# Patient Record
Sex: Female | Born: 1935
Health system: Southern US, Community
[De-identification: ages and names within clinical notes are randomized; demographics above are authoritative.]

## PROBLEM LIST (undated history)

## (undated) DIAGNOSIS — B029 Zoster without complications: Secondary | ICD-10-CM

## (undated) DIAGNOSIS — N3289 Other specified disorders of bladder: Secondary | ICD-10-CM

## (undated) DIAGNOSIS — H9191 Unspecified hearing loss, right ear: Secondary | ICD-10-CM

## (undated) DIAGNOSIS — D329 Benign neoplasm of meninges, unspecified: Secondary | ICD-10-CM

## (undated) DIAGNOSIS — C541 Malignant neoplasm of endometrium: Secondary | ICD-10-CM

## (undated) DIAGNOSIS — N2881 Hypertrophy of kidney: Secondary | ICD-10-CM

## (undated) DIAGNOSIS — I10 Essential (primary) hypertension: Secondary | ICD-10-CM

## (undated) DIAGNOSIS — K5790 Diverticulosis of intestine, part unspecified, without perforation or abscess without bleeding: Secondary | ICD-10-CM

## (undated) DIAGNOSIS — H3530 Unspecified macular degeneration: Secondary | ICD-10-CM

## (undated) DIAGNOSIS — C4490 Unspecified malignant neoplasm of skin, unspecified: Secondary | ICD-10-CM

## (undated) DIAGNOSIS — E039 Hypothyroidism, unspecified: Secondary | ICD-10-CM

## (undated) DIAGNOSIS — F419 Anxiety disorder, unspecified: Secondary | ICD-10-CM

## (undated) DIAGNOSIS — E785 Hyperlipidemia, unspecified: Secondary | ICD-10-CM

## (undated) DIAGNOSIS — K7689 Other specified diseases of liver: Secondary | ICD-10-CM

## (undated) HISTORY — DX: Hypothyroidism, unspecified: E03.9

## (undated) HISTORY — DX: Hypertrophy of kidney: N28.81

## (undated) HISTORY — DX: Benign neoplasm of meninges, unspecified: D32.9

## (undated) HISTORY — DX: Essential (primary) hypertension: I10

## (undated) HISTORY — DX: Diverticulosis of intestine, part unspecified, without perforation or abscess without bleeding: K57.90

## (undated) HISTORY — DX: Anxiety disorder, unspecified: F41.9

## (undated) HISTORY — PX: OTHER SURGICAL HISTORY: SHX169

## (undated) HISTORY — DX: Other specified disorders of bladder: N32.89

## (undated) HISTORY — PX: CATARACT EXTRACTION: SUR2

## (undated) HISTORY — DX: Other specified diseases of liver: K76.89

## (undated) HISTORY — DX: Zoster without complications: B02.9

## (undated) HISTORY — DX: Unspecified malignant neoplasm of skin, unspecified: C44.90

## (undated) HISTORY — DX: Hyperlipidemia, unspecified: E78.5

## (undated) HISTORY — PX: TONSILLECTOMY: SUR1361

---

## 1998-07-20 ENCOUNTER — Emergency Department (HOSPITAL_COMMUNITY): Admission: EM | Admit: 1998-07-20 | Discharge: 1998-07-20 | Payer: Self-pay | Admitting: Emergency Medicine

## 1998-07-20 ENCOUNTER — Encounter: Payer: Self-pay | Admitting: Emergency Medicine

## 1998-08-30 ENCOUNTER — Ambulatory Visit (HOSPITAL_COMMUNITY): Admission: RE | Admit: 1998-08-30 | Discharge: 1998-08-30 | Payer: Self-pay | Admitting: Internal Medicine

## 1998-08-30 ENCOUNTER — Encounter: Payer: Self-pay | Admitting: Internal Medicine

## 1999-09-02 ENCOUNTER — Ambulatory Visit (HOSPITAL_COMMUNITY): Admission: RE | Admit: 1999-09-02 | Discharge: 1999-09-02 | Payer: Self-pay | Admitting: Internal Medicine

## 1999-09-02 ENCOUNTER — Encounter: Payer: Self-pay | Admitting: Internal Medicine

## 1999-09-12 ENCOUNTER — Encounter: Payer: Self-pay | Admitting: Otolaryngology

## 1999-09-12 ENCOUNTER — Encounter: Admission: RE | Admit: 1999-09-12 | Discharge: 1999-09-12 | Payer: Self-pay | Admitting: Otolaryngology

## 1999-11-17 ENCOUNTER — Encounter: Payer: Self-pay | Admitting: Internal Medicine

## 1999-11-17 ENCOUNTER — Ambulatory Visit (HOSPITAL_COMMUNITY): Admission: RE | Admit: 1999-11-17 | Discharge: 1999-11-17 | Payer: Self-pay | Admitting: Internal Medicine

## 2000-01-28 ENCOUNTER — Encounter: Payer: Self-pay | Admitting: Internal Medicine

## 2000-01-28 ENCOUNTER — Ambulatory Visit (HOSPITAL_COMMUNITY): Admission: RE | Admit: 2000-01-28 | Discharge: 2000-01-28 | Payer: Self-pay | Admitting: Internal Medicine

## 2000-06-14 ENCOUNTER — Encounter: Payer: Self-pay | Admitting: Internal Medicine

## 2000-06-14 ENCOUNTER — Encounter: Admission: RE | Admit: 2000-06-14 | Discharge: 2000-06-14 | Payer: Self-pay | Admitting: Internal Medicine

## 2000-09-07 ENCOUNTER — Encounter: Payer: Self-pay | Admitting: Internal Medicine

## 2000-09-07 ENCOUNTER — Ambulatory Visit (HOSPITAL_COMMUNITY): Admission: RE | Admit: 2000-09-07 | Discharge: 2000-09-07 | Payer: Self-pay | Admitting: Internal Medicine

## 2004-12-22 ENCOUNTER — Ambulatory Visit: Payer: Self-pay | Admitting: Internal Medicine

## 2005-05-25 ENCOUNTER — Ambulatory Visit: Payer: Self-pay | Admitting: Internal Medicine

## 2005-06-15 ENCOUNTER — Ambulatory Visit: Payer: Self-pay | Admitting: Internal Medicine

## 2005-06-18 ENCOUNTER — Encounter: Admission: RE | Admit: 2005-06-18 | Discharge: 2005-06-18 | Payer: Self-pay | Admitting: Internal Medicine

## 2005-07-06 ENCOUNTER — Ambulatory Visit (HOSPITAL_COMMUNITY): Admission: RE | Admit: 2005-07-06 | Discharge: 2005-07-06 | Payer: Self-pay | Admitting: Internal Medicine

## 2005-12-01 ENCOUNTER — Ambulatory Visit: Payer: Self-pay | Admitting: Internal Medicine

## 2006-04-22 ENCOUNTER — Ambulatory Visit: Payer: Self-pay | Admitting: Internal Medicine

## 2006-06-09 ENCOUNTER — Ambulatory Visit: Payer: Self-pay | Admitting: Endocrinology

## 2006-06-25 ENCOUNTER — Ambulatory Visit: Payer: Self-pay | Admitting: Internal Medicine

## 2006-07-12 ENCOUNTER — Ambulatory Visit (HOSPITAL_COMMUNITY): Admission: RE | Admit: 2006-07-12 | Discharge: 2006-07-12 | Payer: Self-pay | Admitting: Internal Medicine

## 2006-07-19 ENCOUNTER — Ambulatory Visit: Payer: Self-pay | Admitting: Internal Medicine

## 2006-07-27 ENCOUNTER — Encounter: Admission: RE | Admit: 2006-07-27 | Discharge: 2006-07-27 | Payer: Self-pay | Admitting: Internal Medicine

## 2007-03-29 ENCOUNTER — Ambulatory Visit: Payer: Self-pay | Admitting: Internal Medicine

## 2007-04-27 ENCOUNTER — Ambulatory Visit: Payer: Self-pay | Admitting: Internal Medicine

## 2007-04-27 LAB — CONVERTED CEMR LAB
BUN: 29 mg/dL — ABNORMAL HIGH (ref 6–23)
CO2: 29 meq/L (ref 19–32)
Calcium: 10 mg/dL (ref 8.4–10.5)
Chloride: 108 meq/L (ref 96–112)
Cholesterol: 255 mg/dL (ref 0–200)
Creatinine, Ser: 1.4 mg/dL — ABNORMAL HIGH (ref 0.4–1.2)
Direct LDL: 155.2 mg/dL
Free T4: 0.9 ng/dL (ref 0.6–1.6)
GFR calc Af Amer: 46 mL/min
GFR calc non Af Amer: 38 mL/min
Glucose, Bld: 104 mg/dL — ABNORMAL HIGH (ref 70–99)
HDL: 34 mg/dL — ABNORMAL LOW (ref >39.0)
Potassium: 4 meq/L (ref 3.5–5.1)
Sodium: 143 meq/L (ref 135–145)
TSH: 2.68 microintl units/mL (ref 0.35–5.50)
Total CHOL/HDL Ratio: 7.5
Triglycerides: 528 mg/dL (ref 0–149)
VLDL: 106 mg/dL — ABNORMAL HIGH (ref 0–40)

## 2007-04-30 ENCOUNTER — Encounter: Admission: RE | Admit: 2007-04-30 | Discharge: 2007-04-30 | Payer: Self-pay | Admitting: Internal Medicine

## 2007-05-13 ENCOUNTER — Encounter: Payer: Self-pay | Admitting: *Deleted

## 2007-05-13 DIAGNOSIS — E039 Hypothyroidism, unspecified: Secondary | ICD-10-CM | POA: Insufficient documentation

## 2007-05-13 DIAGNOSIS — E785 Hyperlipidemia, unspecified: Secondary | ICD-10-CM

## 2007-05-13 DIAGNOSIS — N3289 Other specified disorders of bladder: Secondary | ICD-10-CM

## 2007-05-13 DIAGNOSIS — Z85828 Personal history of other malignant neoplasm of skin: Secondary | ICD-10-CM

## 2007-05-13 DIAGNOSIS — Z9849 Cataract extraction status, unspecified eye: Secondary | ICD-10-CM

## 2007-05-13 DIAGNOSIS — K7689 Other specified diseases of liver: Secondary | ICD-10-CM

## 2007-05-13 DIAGNOSIS — D320 Benign neoplasm of cerebral meninges: Secondary | ICD-10-CM

## 2007-05-13 DIAGNOSIS — I10 Essential (primary) hypertension: Secondary | ICD-10-CM | POA: Insufficient documentation

## 2007-05-13 HISTORY — DX: Other specified diseases of liver: K76.89

## 2007-06-23 ENCOUNTER — Telehealth: Payer: Self-pay | Admitting: Internal Medicine

## 2007-07-04 ENCOUNTER — Ambulatory Visit: Payer: Self-pay | Admitting: Internal Medicine

## 2007-07-25 ENCOUNTER — Ambulatory Visit: Payer: Self-pay | Admitting: Internal Medicine

## 2007-07-25 DIAGNOSIS — F4321 Adjustment disorder with depressed mood: Secondary | ICD-10-CM | POA: Insufficient documentation

## 2007-07-27 ENCOUNTER — Ambulatory Visit: Payer: Self-pay | Admitting: Licensed Clinical Social Worker

## 2007-08-29 ENCOUNTER — Ambulatory Visit: Payer: Self-pay | Admitting: Internal Medicine

## 2007-08-29 LAB — CONVERTED CEMR LAB
BUN: 23 mg/dL (ref 6–23)
Basophils Absolute: 0 10*3/uL (ref 0.0–0.1)
Basophils Relative: 0 % (ref 0.0–1.0)
CO2: 29 meq/L (ref 19–32)
Calcium: 9.8 mg/dL (ref 8.4–10.5)
Chloride: 104 meq/L (ref 96–112)
Creatinine, Ser: 1.4 mg/dL — ABNORMAL HIGH (ref 0.4–1.2)
Eosinophils Absolute: 0.3 10*3/uL (ref 0.0–0.6)
Eosinophils Relative: 4.2 % (ref 0.0–5.0)
GFR calc Af Amer: 46 mL/min
GFR calc non Af Amer: 38 mL/min
Glucose, Bld: 126 mg/dL — ABNORMAL HIGH (ref 70–99)
HCT: 39.5 % (ref 36.0–46.0)
Hemoglobin: 14 g/dL (ref 12.0–15.0)
Lymphocytes Relative: 23.8 % (ref 12.0–46.0)
MCHC: 35.3 g/dL (ref 30.0–36.0)
MCV: 89.6 fL (ref 78.0–100.0)
Monocytes Absolute: 0.4 10*3/uL (ref 0.2–0.7)
Monocytes Relative: 5.5 % (ref 3.0–11.0)
Neutro Abs: 5.2 10*3/uL (ref 1.4–7.7)
Neutrophils Relative %: 66.5 % (ref 43.0–77.0)
Platelets: 271 10*3/uL (ref 150–400)
Potassium: 4.1 meq/L (ref 3.5–5.1)
RBC: 4.41 M/uL (ref 3.87–5.11)
RDW: 11.8 % (ref 11.5–14.6)
Sodium: 140 meq/L (ref 135–145)
WBC: 7.7 10*3/uL (ref 4.5–10.5)

## 2007-08-30 ENCOUNTER — Encounter: Payer: Self-pay | Admitting: Internal Medicine

## 2007-08-31 ENCOUNTER — Ambulatory Visit (HOSPITAL_COMMUNITY): Admission: RE | Admit: 2007-08-31 | Discharge: 2007-08-31 | Payer: Self-pay | Admitting: Internal Medicine

## 2007-09-06 ENCOUNTER — Ambulatory Visit: Payer: Self-pay

## 2007-09-16 ENCOUNTER — Telehealth: Payer: Self-pay | Admitting: Internal Medicine

## 2007-09-30 ENCOUNTER — Encounter: Payer: Self-pay | Admitting: Internal Medicine

## 2007-10-19 ENCOUNTER — Ambulatory Visit: Payer: Self-pay | Admitting: Internal Medicine

## 2007-10-19 ENCOUNTER — Telehealth: Payer: Self-pay | Admitting: Internal Medicine

## 2007-10-19 DIAGNOSIS — R4182 Altered mental status, unspecified: Secondary | ICD-10-CM

## 2007-10-19 LAB — CONVERTED CEMR LAB
BUN: 22 mg/dL (ref 6–23)
Basophils Absolute: 0.1 10*3/uL (ref 0.0–0.1)
Basophils Relative: 1 % (ref 0.0–1.0)
Bilirubin Urine: NEGATIVE
CO2: 29 meq/L (ref 19–32)
Calcium: 9.8 mg/dL (ref 8.4–10.5)
Chloride: 103 meq/L (ref 96–112)
Creatinine, Ser: 1.3 mg/dL — ABNORMAL HIGH (ref 0.4–1.2)
Eosinophils Absolute: 0.2 10*3/uL (ref 0.0–0.6)
Eosinophils Relative: 2.2 % (ref 0.0–5.0)
Folate: 8.9 ng/mL
GFR calc Af Amer: 50 mL/min
GFR calc non Af Amer: 42 mL/min
Glucose, Bld: 112 mg/dL — ABNORMAL HIGH (ref 70–99)
Glucose, Urine, Semiquant: NEGATIVE
HCT: 43.2 % (ref 36.0–46.0)
Hemoglobin: 14.5 g/dL (ref 12.0–15.0)
Ketones, urine, test strip: NEGATIVE
Lymphocytes Relative: 27.7 % (ref 12.0–46.0)
MCHC: 33.6 g/dL (ref 30.0–36.0)
MCV: 89.2 fL (ref 78.0–100.0)
Monocytes Absolute: 0.6 10*3/uL (ref 0.2–0.7)
Monocytes Relative: 7.8 % (ref 3.0–11.0)
Neutro Abs: 4.8 10*3/uL (ref 1.4–7.7)
Neutrophils Relative %: 61.3 % (ref 43.0–77.0)
Nitrite: NEGATIVE
Platelets: 283 10*3/uL (ref 150–400)
Potassium: 3.9 meq/L (ref 3.5–5.1)
RBC: 4.84 M/uL (ref 3.87–5.11)
RDW: 11.7 % (ref 11.5–14.6)
Sodium: 140 meq/L (ref 135–145)
Specific Gravity, Urine: 1.025
TSH: 2.84 microintl units/mL (ref 0.35–5.50)
Urobilinogen, UA: 0.2
Vitamin B-12: 195 pg/mL — ABNORMAL LOW (ref 211–911)
WBC Urine, dipstick: NEGATIVE
WBC: 7.9 10*3/uL (ref 4.5–10.5)
pH: 6

## 2007-10-20 ENCOUNTER — Encounter: Payer: Self-pay | Admitting: Internal Medicine

## 2007-10-29 ENCOUNTER — Encounter: Admission: RE | Admit: 2007-10-29 | Discharge: 2007-10-29 | Payer: Self-pay | Admitting: Internal Medicine

## 2007-11-06 ENCOUNTER — Encounter: Payer: Self-pay | Admitting: Internal Medicine

## 2007-11-10 ENCOUNTER — Ambulatory Visit: Payer: Self-pay | Admitting: Internal Medicine

## 2007-11-10 DIAGNOSIS — E538 Deficiency of other specified B group vitamins: Secondary | ICD-10-CM | POA: Insufficient documentation

## 2007-12-07 ENCOUNTER — Ambulatory Visit: Payer: Self-pay | Admitting: Internal Medicine

## 2008-01-06 ENCOUNTER — Ambulatory Visit: Payer: Self-pay | Admitting: Internal Medicine

## 2008-01-06 ENCOUNTER — Telehealth: Payer: Self-pay | Admitting: Internal Medicine

## 2008-01-13 ENCOUNTER — Ambulatory Visit: Payer: Self-pay | Admitting: Internal Medicine

## 2008-01-13 DIAGNOSIS — R197 Diarrhea, unspecified: Secondary | ICD-10-CM

## 2008-02-07 ENCOUNTER — Ambulatory Visit: Payer: Self-pay | Admitting: Internal Medicine

## 2008-02-27 ENCOUNTER — Ambulatory Visit: Payer: Self-pay | Admitting: Internal Medicine

## 2008-03-08 ENCOUNTER — Ambulatory Visit: Payer: Self-pay | Admitting: Internal Medicine

## 2008-03-21 ENCOUNTER — Ambulatory Visit: Payer: Self-pay | Admitting: Internal Medicine

## 2008-03-21 ENCOUNTER — Encounter (INDEPENDENT_AMBULATORY_CARE_PROVIDER_SITE_OTHER): Payer: Self-pay | Admitting: *Deleted

## 2008-03-23 ENCOUNTER — Encounter: Payer: Self-pay | Admitting: Internal Medicine

## 2008-03-26 ENCOUNTER — Encounter: Payer: Self-pay | Admitting: Internal Medicine

## 2008-03-28 ENCOUNTER — Telehealth: Payer: Self-pay | Admitting: Internal Medicine

## 2008-04-05 ENCOUNTER — Ambulatory Visit: Payer: Self-pay | Admitting: Internal Medicine

## 2008-05-09 ENCOUNTER — Ambulatory Visit: Payer: Self-pay | Admitting: Internal Medicine

## 2008-07-30 ENCOUNTER — Telehealth: Payer: Self-pay | Admitting: Internal Medicine

## 2008-09-20 ENCOUNTER — Ambulatory Visit (HOSPITAL_COMMUNITY): Admission: RE | Admit: 2008-09-20 | Discharge: 2008-09-20 | Payer: Self-pay | Admitting: Internal Medicine

## 2008-10-03 ENCOUNTER — Telehealth: Payer: Self-pay | Admitting: Internal Medicine

## 2008-10-04 ENCOUNTER — Telehealth: Payer: Self-pay | Admitting: Internal Medicine

## 2008-10-22 ENCOUNTER — Ambulatory Visit: Payer: Self-pay | Admitting: Internal Medicine

## 2008-10-22 DIAGNOSIS — M25519 Pain in unspecified shoulder: Secondary | ICD-10-CM

## 2009-01-23 ENCOUNTER — Telehealth: Payer: Self-pay | Admitting: Internal Medicine

## 2009-03-28 ENCOUNTER — Ambulatory Visit: Payer: Self-pay | Admitting: Internal Medicine

## 2009-03-29 DIAGNOSIS — R198 Other specified symptoms and signs involving the digestive system and abdomen: Secondary | ICD-10-CM | POA: Insufficient documentation

## 2009-04-22 ENCOUNTER — Telehealth: Payer: Self-pay | Admitting: Internal Medicine

## 2009-06-04 ENCOUNTER — Telehealth: Payer: Self-pay | Admitting: Internal Medicine

## 2009-11-22 HISTORY — PX: COLONOSCOPY: SHX174

## 2009-11-25 ENCOUNTER — Ambulatory Visit (HOSPITAL_COMMUNITY): Admission: RE | Admit: 2009-11-25 | Discharge: 2009-11-25 | Payer: Self-pay | Admitting: Internal Medicine

## 2009-11-25 ENCOUNTER — Ambulatory Visit: Payer: Self-pay | Admitting: Internal Medicine

## 2009-11-25 DIAGNOSIS — G510 Bell's palsy: Secondary | ICD-10-CM | POA: Insufficient documentation

## 2009-12-04 ENCOUNTER — Ambulatory Visit: Payer: Self-pay | Admitting: Internal Medicine

## 2009-12-04 ENCOUNTER — Inpatient Hospital Stay (HOSPITAL_COMMUNITY): Admission: EM | Admit: 2009-12-04 | Discharge: 2009-12-13 | Payer: Self-pay | Admitting: Emergency Medicine

## 2009-12-05 ENCOUNTER — Ambulatory Visit: Payer: Self-pay | Admitting: Internal Medicine

## 2009-12-07 ENCOUNTER — Ambulatory Visit: Payer: Self-pay | Admitting: Gastroenterology

## 2009-12-12 ENCOUNTER — Encounter: Payer: Self-pay | Admitting: Internal Medicine

## 2009-12-16 ENCOUNTER — Ambulatory Visit: Payer: Self-pay | Admitting: Internal Medicine

## 2009-12-16 LAB — CONVERTED CEMR LAB
Basophils Absolute: 0 10*3/uL (ref 0.0–0.1)
Basophils Relative: 0.3 % (ref 0.0–3.0)
Eosinophils Absolute: 0.1 10*3/uL (ref 0.0–0.7)
Eosinophils Relative: 1.9 % (ref 0.0–5.0)
HCT: 28.1 % — ABNORMAL LOW (ref 36.0–46.0)
Hemoglobin: 9.7 g/dL — ABNORMAL LOW (ref 12.0–15.0)
Lymphocytes Relative: 19.1 % (ref 12.0–46.0)
Lymphs Abs: 1.4 10*3/uL (ref 0.7–4.0)
MCHC: 34.7 g/dL (ref 30.0–36.0)
MCV: 88.7 fL (ref 78.0–100.0)
Monocytes Absolute: 0.5 10*3/uL (ref 0.1–1.0)
Monocytes Relative: 6.3 % (ref 3.0–12.0)
Neutro Abs: 5.3 10*3/uL (ref 1.4–7.7)
Neutrophils Relative %: 72.4 % (ref 43.0–77.0)
Platelets: 420 10*3/uL — ABNORMAL HIGH (ref 150.0–400.0)
RBC: 3.16 M/uL — ABNORMAL LOW (ref 3.87–5.11)
RDW: 16.1 % — ABNORMAL HIGH (ref 11.5–14.6)
WBC: 7.3 10*3/uL (ref 4.5–10.5)

## 2009-12-25 ENCOUNTER — Ambulatory Visit: Payer: Self-pay | Admitting: Internal Medicine

## 2009-12-25 LAB — CONVERTED CEMR LAB
Basophils Absolute: 0 10*3/uL (ref 0.0–0.1)
Basophils Relative: 0.1 % (ref 0.0–3.0)
Eosinophils Absolute: 0.1 10*3/uL (ref 0.0–0.7)
Eosinophils Relative: 1.6 % (ref 0.0–5.0)
HCT: 29.4 % — ABNORMAL LOW (ref 36.0–46.0)
Hemoglobin: 10.1 g/dL — ABNORMAL LOW (ref 12.0–15.0)
Lymphocytes Relative: 24.2 % (ref 12.0–46.0)
Lymphs Abs: 1.4 10*3/uL (ref 0.7–4.0)
MCHC: 34.5 g/dL (ref 30.0–36.0)
MCV: 87.8 fL (ref 78.0–100.0)
Monocytes Absolute: 0.4 10*3/uL (ref 0.1–1.0)
Monocytes Relative: 7.2 % (ref 3.0–12.0)
Neutro Abs: 3.8 10*3/uL (ref 1.4–7.7)
Neutrophils Relative %: 66.9 % (ref 43.0–77.0)
Platelets: 345 10*3/uL (ref 150.0–400.0)
RBC: 3.34 M/uL — ABNORMAL LOW (ref 3.87–5.11)
RDW: 16.4 % — ABNORMAL HIGH (ref 11.5–14.6)
WBC: 5.7 10*3/uL (ref 4.5–10.5)

## 2009-12-30 ENCOUNTER — Telehealth: Payer: Self-pay | Admitting: Internal Medicine

## 2010-01-02 ENCOUNTER — Ambulatory Visit: Payer: Self-pay | Admitting: Internal Medicine

## 2010-01-02 DIAGNOSIS — K922 Gastrointestinal hemorrhage, unspecified: Secondary | ICD-10-CM | POA: Insufficient documentation

## 2010-01-08 ENCOUNTER — Ambulatory Visit: Payer: Self-pay | Admitting: Internal Medicine

## 2010-01-08 LAB — CONVERTED CEMR LAB
Basophils Absolute: 0 10*3/uL (ref 0.0–0.1)
Basophils Relative: 0.1 % (ref 0.0–3.0)
Eosinophils Absolute: 0.1 10*3/uL (ref 0.0–0.7)
Eosinophils Relative: 2.2 % (ref 0.0–5.0)
HCT: 33.1 % — ABNORMAL LOW (ref 36.0–46.0)
Hemoglobin: 11.5 g/dL — ABNORMAL LOW (ref 12.0–15.0)
Lymphocytes Relative: 19.5 % (ref 12.0–46.0)
Lymphs Abs: 1.3 10*3/uL (ref 0.7–4.0)
MCHC: 34.7 g/dL (ref 30.0–36.0)
MCV: 88.6 fL (ref 78.0–100.0)
Monocytes Absolute: 0.4 10*3/uL (ref 0.1–1.0)
Monocytes Relative: 6.6 % (ref 3.0–12.0)
Neutro Abs: 4.8 10*3/uL (ref 1.4–7.7)
Neutrophils Relative %: 71.6 % (ref 43.0–77.0)
Platelets: 234 10*3/uL (ref 150.0–400.0)
RBC: 3.74 M/uL — ABNORMAL LOW (ref 3.87–5.11)
RDW: 15.5 % — ABNORMAL HIGH (ref 11.5–14.6)
WBC: 6.7 10*3/uL (ref 4.5–10.5)

## 2010-01-12 ENCOUNTER — Telehealth: Payer: Self-pay | Admitting: Internal Medicine

## 2010-02-03 ENCOUNTER — Ambulatory Visit: Payer: Self-pay | Admitting: Internal Medicine

## 2010-02-10 LAB — CONVERTED CEMR LAB
Basophils Absolute: 0 10*3/uL (ref 0.0–0.1)
Basophils Relative: 0.2 % (ref 0.0–3.0)
Eosinophils Absolute: 0.2 10*3/uL (ref 0.0–0.7)
Eosinophils Relative: 2.3 % (ref 0.0–5.0)
HCT: 38.2 % (ref 36.0–46.0)
Hemoglobin: 13.4 g/dL (ref 12.0–15.0)
Lymphocytes Relative: 17.6 % (ref 12.0–46.0)
Lymphs Abs: 1.3 10*3/uL (ref 0.7–4.0)
MCHC: 35 g/dL (ref 30.0–36.0)
MCV: 88.1 fL (ref 78.0–100.0)
Monocytes Absolute: 0.4 10*3/uL (ref 0.1–1.0)
Monocytes Relative: 5.8 % (ref 3.0–12.0)
Neutro Abs: 5.4 10*3/uL (ref 1.4–7.7)
Neutrophils Relative %: 74.1 % (ref 43.0–77.0)
Platelets: 255 10*3/uL (ref 150.0–400.0)
RBC: 4.34 M/uL (ref 3.87–5.11)
RDW: 14.5 % (ref 11.5–14.6)
WBC: 7.3 10*3/uL (ref 4.5–10.5)

## 2010-04-03 ENCOUNTER — Ambulatory Visit: Payer: Self-pay | Admitting: Internal Medicine

## 2010-04-03 DIAGNOSIS — R350 Frequency of micturition: Secondary | ICD-10-CM

## 2010-04-03 DIAGNOSIS — H449 Unspecified disorder of globe: Secondary | ICD-10-CM

## 2010-04-03 LAB — CONVERTED CEMR LAB
BUN: 26 mg/dL — ABNORMAL HIGH (ref 6–23)
Basophils Absolute: 0 10*3/uL (ref 0.0–0.1)
Basophils Relative: 0.1 % (ref 0.0–3.0)
CO2: 26 meq/L (ref 19–32)
Calcium: 9.9 mg/dL (ref 8.4–10.5)
Chloride: 107 meq/L (ref 96–112)
Cholesterol, target level: 200 mg/dL
Creatinine, Ser: 1.4 mg/dL — ABNORMAL HIGH (ref 0.4–1.2)
Eosinophils Absolute: 0.2 10*3/uL (ref 0.0–0.7)
Eosinophils Relative: 2.6 % (ref 0.0–5.0)
Folate: 14.7 ng/mL
GFR calc non Af Amer: 38.95 mL/min (ref >60)
Glucose, Bld: 96 mg/dL (ref 70–99)
HCT: 40.3 % (ref 36.0–46.0)
HDL goal, serum: 40 mg/dL
Hemoglobin: 13.9 g/dL (ref 12.0–15.0)
LDL Goal: 130 mg/dL
Lymphocytes Relative: 20.2 % (ref 12.0–46.0)
Lymphs Abs: 1.5 10*3/uL (ref 0.7–4.0)
MCHC: 34.4 g/dL (ref 30.0–36.0)
MCV: 86.7 fL (ref 78.0–100.0)
Monocytes Absolute: 0.5 10*3/uL (ref 0.1–1.0)
Monocytes Relative: 7 % (ref 3.0–12.0)
Neutro Abs: 5.3 10*3/uL (ref 1.4–7.7)
Neutrophils Relative %: 70.1 % (ref 43.0–77.0)
Platelets: 266 10*3/uL (ref 150.0–400.0)
Potassium: 4.5 meq/L (ref 3.5–5.1)
RBC: 4.65 M/uL (ref 3.87–5.11)
RDW: 13.8 % (ref 11.5–14.6)
Sodium: 145 meq/L (ref 135–145)
Vitamin B-12: 298 pg/mL (ref 211–911)
WBC: 7.6 10*3/uL (ref 4.5–10.5)

## 2010-04-06 ENCOUNTER — Encounter: Payer: Self-pay | Admitting: Internal Medicine

## 2010-04-08 ENCOUNTER — Telehealth: Payer: Self-pay | Admitting: Internal Medicine

## 2010-04-10 ENCOUNTER — Ambulatory Visit (HOSPITAL_COMMUNITY): Admission: RE | Admit: 2010-04-10 | Discharge: 2010-04-10 | Payer: Self-pay | Admitting: Internal Medicine

## 2010-04-10 ENCOUNTER — Ambulatory Visit: Payer: Self-pay | Admitting: Internal Medicine

## 2010-04-10 ENCOUNTER — Ambulatory Visit: Payer: Self-pay

## 2010-05-20 ENCOUNTER — Encounter: Payer: Self-pay | Admitting: Internal Medicine

## 2010-06-20 ENCOUNTER — Ambulatory Visit: Payer: Self-pay | Admitting: Internal Medicine

## 2010-06-22 ENCOUNTER — Encounter: Payer: Self-pay | Admitting: Internal Medicine

## 2010-09-14 ENCOUNTER — Encounter: Payer: Self-pay | Admitting: Endocrinology

## 2010-09-25 NOTE — Progress Notes (Signed)
  Phone Note Refill Request Message from:  Fax from Pharmacy on April 08, 2010 11:04 AM  Refills Requested: Medication #1:  SERTRALINE HCL 100 MG  TABS 1 by mouth once daily Initial call taken by: Ami Bullins CMA,  April 08, 2010 11:04 AM    Prescriptions: SERTRALINE HCL 100 MG  TABS (SERTRALINE HCL) 1 by mouth once daily  #30 Each x 4   Entered by:   Ami Bullins CMA   Authorized by:   Jacques Navy MD   Signed by:   Bill Salinas CMA on 04/08/2010   Method used:   Electronically to        [facility]* (retail)       [address]       [place], [place]  [id number]       Ph: [phone]       Fax: [phone]   RxID:   [id number]

## 2010-09-25 NOTE — Assessment & Plan Note (Signed)
Summary: FU---STC   Vital Signs:  Patient profile:   77 year old female Height:      66 inches O2 Sat:      95 % on Room air Temp:     98.2 degrees F oral Pulse rate:   104 / minute BP sitting:   118 / 60  (left arm) Cuff size:   regular  Vitals Entered By: Bill Salinas CMA (Jan 02, 2010 1:08 PM)  O2 Flow:  Room air CC: pt here for followup, pt states she is no longer taking aspirin or plavix/ ab   Primary Care Provider:  Nandi Tonnesen  CC:  pt here for followup and pt states she is no longer taking aspirin or plavix/ ab.  History of Present Illness: Patient is here for follow-up after her hospitalization 4/13-4/22/2050for GI bleed and anemia.  She reports that her stools are no longer black but are now dark brown.  She does have some dark stool and is concerned that she is continuing to bleed.  She complains of shortwindedness and exhaustion with even brief tasks around the house.  She reports low energy.  She denies new onset of lightheadedness but says she has chronic problems with vertigo.    Current Medications (verified): 1)  Levothyroxine Sodium 100 Mcg  Tabs (Levothyroxine Sodium) .Marland Kitchen.. 1 By Mouth Once Daily 2)  Lisinopril 20 Mg  Tabs (Lisinopril) .... Take One Tablet Once Daily 3)  Occuvite .... Take One Tablet Daily 4)  Hydrochlorothiazide 25 Mg  Tabs (Hydrochlorothiazide) .... Take One Tablet Once Daily 5)  Aspirin Low Dose 81 Mg Tabs (Aspirin) .Marland Kitchen.. 1 Once Daily 6)  Sertraline Hcl 100 Mg  Tabs (Sertraline Hcl) .Marland Kitchen.. 1 By Mouth Once Daily 7)  Metamucil Plus Calcium   Caps (Psyllium-Calcium) .... Take 2 Tablet By Mouth Once A Day 8)  Plavix 75 Mg Tabs (Clopidogrel Bisulfate) .Marland Kitchen.. 1 Once Daily 9)  Prilosec 40 Mg Cpdr (Omeprazole) .Marland Kitchen.. 1 Daily  Allergies (verified): 1)  ! Penicillin 2)  ! Biaxin 3)  ! Neomycin  Past History:  Past Medical History: Last updated: 05/13/2007 SKIN CANCER, HX OF (ICD-V10.83) Hx of MENINGIOMA (ICD-225.2) HEPATIC CYST (ICD-573.8) HYPERTROPHY,  KIDNEY LEFT (ICD-593.1) IRRITABLE BLADDER (ICD-596.8) HYPERLIPIDEMIA (ICD-272.4) HYPOTHYROIDISM (ICD-244.9) HYPERTENSION (ICD-401.9)    Past Surgical History: Last updated: 05/13/2007 * MASTOID SURGERY FOR MENINGIOMA CATARACT EXTRACTION, HX OF (ICD-V45.61)  Review of Systems       The patient complains of dyspnea on exertion, melena, and muscle weakness.  The patient denies anorexia, fever, weight loss, weight gain, chest pain, peripheral edema, prolonged cough, abdominal pain, and hematochezia.    Physical Exam  General:  alert and well-developed.   Head:  normocephalic and atraumatic.  mild asymmetry of facial muscles. Lungs:  normal respiratory effort, no intercostal retractions, no accessory muscle use, normal breath sounds, no crackles, and no wheezes.   Heart:  regular rhythm, no murmur, no gallop, no rub, and tachycardia.   Abdomen:  soft, non-tender, normal bowel sounds, no distention, no masses, and no guarding.     Impression & Recommendations:  Problem # 1:  GI BLEEDING (ICD-578.9) Patient was seen in hospital for GI bleeding and is here for follow-up.  She continues to have dark stools, but denies having the black, tarry stools she had previously.  It is possible that the darkness of her stools is secondary to the ferrous sulfate.    Cause of bleeding was not determined during the hospitalization.  Colonoscopy showed no abnormalities.  EGD showed gastritis and mild AVMs with no active bleeding.  A capsule endoscopy is still pending.  She has had significant anemia secondary to the GI bleeding.  She received a transfusion during her hospital stay.  Her Hgb has been stable since discharge at 9.7 on 4/25 and 10.1 on 5/4.  Her lack of energy and shortwindedness are likely due to her anemia.  Plan- continue Omeprazole 40mg  once daily and ferrous sulfate          Followup with Dr. Loreta Ave on capsule endoscopy results         Recheck CBC next Wednesday, 5/18  Complete  Medication List: 1)  Levothyroxine Sodium 100 Mcg Tabs (Levothyroxine sodium) .Marland Kitchen.. 1 by mouth once daily 2)  Lisinopril 20 Mg Tabs (Lisinopril) .... Take one tablet once daily 3)  Occuvite  .... Take one tablet daily 4)  Hydrochlorothiazide 25 Mg Tabs (Hydrochlorothiazide) .... Take one tablet once daily 5)  Aspirin Low Dose 81 Mg Tabs (Aspirin) .Marland Kitchen.. 1 once daily 6)  Sertraline Hcl 100 Mg Tabs (Sertraline hcl) .Marland Kitchen.. 1 by mouth once daily 7)  Metamucil Plus Calcium Caps (Psyllium-calcium) .... Take 2 tablet by mouth once a day 8)  Plavix 75 Mg Tabs (Clopidogrel bisulfate) .Marland Kitchen.. 1 once daily 9)  Prilosec 40 Mg Cpdr (Omeprazole) .Marland Kitchen.. 1 daily

## 2010-09-25 NOTE — Procedures (Signed)
Summary: Capsule Endoscopy/[facility]  Capsule Endoscopy/[facility]   Imported By: Sherian Rein 01/07/2010 12:17:28  _____________________________________________________________________  External Attachment:    Type:   Image     Comment:   External Document

## 2010-09-25 NOTE — Progress Notes (Signed)
  Phone Note Outgoing Call   Reason for Call: Discuss lab or test results Summary of Call: please call patient - hemoglobin is stable at 10.1 g. Thanks Initial call taken by: Jacques Navy MD,  Dec 30, 2009 8:44 AM  Follow-up for Phone Call        informed pt of results. Pt has appt this thurs.  Follow-up by: Ami Bullins CMA,  Dec 30, 2009 11:24 AM

## 2010-09-25 NOTE — Assessment & Plan Note (Signed)
Summary: THINKS SHE HAD A MINI STROKE YESTERDAY/NWS   Vital Signs:  Patient profile:   77 year old female Height:      66 inches O2 Sat:      94 % on Room air Temp:     97.7 degrees F oral Pulse rate:   92 / minute BP sitting:   132 / 98  (left arm) Cuff size:   regular  Vitals Entered By: Bill Salinas CMA (November 25, 2009 10:05 AM)  O2 Flow:  Room air CC: Pt here after onset of pulsating feeling in her head on friday and saturday. Pt noticed dropping in her mouth after family members pointed it out yesterday/ ab   Primary Care Provider:  Norins  CC:  Pt here after onset of pulsating feeling in her head on friday and saturday. Pt noticed dropping in her mouth after family members pointed it out yesterday/ ab.  History of Present Illness: Patient presents with a 48 hours h/o decrease movement of the right side of her mouth-more a paralysis then a droop.She does complain of having had some pressure on the right side of her head. she denies paresthesia, motor weakness, change in cognition, dysarthria, dysphagia or other focal neurolgic symptoms. She has been taking full strength ASA. She has had well controlled hypertension.   Current Medications (verified): 1)  Levothyroxine Sodium 100 Mcg  Tabs (Levothyroxine Sodium) .Marland Kitchen.. 1 By Mouth Once Daily 2)  Lisinopril 20 Mg  Tabs (Lisinopril) .... Take One Tablet Once Daily 3)  Occuvite .... Take One Tablet Daily 4)  Hydrochlorothiazide 25 Mg  Tabs (Hydrochlorothiazide) .... Take One Tablet Once Daily 5)  Aspirin 325 Mg  Tabs (Aspirin) .... Once Daily 6)  Sertraline Hcl 100 Mg  Tabs (Sertraline Hcl) .Marland Kitchen.. 1 By Mouth Once Daily 7)  Metamucil Plus Calcium   Caps (Psyllium-Calcium) .... Take 2 Tablet By Mouth Once A Day  Allergies (verified): 1)  ! Penicillin 2)  ! Biaxin 3)  ! Neomycin  Past History:  Past Medical History: Last updated: 05/13/2007 SKIN CANCER, HX OF (ICD-V10.83) Hx of MENINGIOMA (ICD-225.2) HEPATIC CYST  (ICD-573.8) HYPERTROPHY, KIDNEY LEFT (ICD-593.1) IRRITABLE BLADDER (ICD-596.8) HYPERLIPIDEMIA (ICD-272.4) HYPOTHYROIDISM (ICD-244.9) HYPERTENSION (ICD-401.9)    Past Surgical History: Last updated: 05/13/2007 * MASTOID SURGERY FOR MENINGIOMA CATARACT EXTRACTION, HX OF (ICD-V45.61)  Family History: Last updated: 10/19/2007 non-contributory.  Social History: Last updated: 10/19/2007 recently widowed after 62 years. She just recently sold off many household goods and had a finality to her loss.   Review of Systems  The patient denies anorexia, fever, weight loss, weight gain, decreased hearing, chest pain, syncope, dyspnea on exertion, peripheral edema, prolonged cough, abdominal pain, incontinence, muscle weakness, difficulty walking, unusual weight change, enlarged lymph nodes, and angioedema.    Physical Exam  General:  WNWD white female in no distress. Head:  normocephalic and atraumatic.   Eyes:  vision grossly intact, pupils equal, pupils round, pupils reactive to light, corneas and lenses clear, no optic disk abnormalities, and no retinal abnormalitiies.   Mouth:  good dentition.   Neck:  supple and full ROM.   Lungs:  normal respiratory effort and normal breath sounds.   Heart:  normal rate, regular rhythm, and no murmur.   Msk:  normal ROM, no joint tenderness, no joint swelling, no joint warmth, and no joint instability.   Pulses:  2+ radial Neurologic:  alert & oriented X3.  Decreased movement of the right face - corner of the mouth and brow.  PERRLA, EOMI, MS 5/5, cerbellar function normal with  finger-to-nose, gait, tandem gait, heel-to-shin. Speech clear, cognition normal. Skin:  turgor normal and color normal.   Cervical Nodes:  no anterior cervical adenopathy and no posterior cervical adenopathy.   Psych:  Oriented X3, memory intact for recent and remote, normally interactive, and good eye contact.     Impression & Recommendations:  Problem # 1:  BELL'S PALSY  (ICD-351.0)  Patient with facial muscle movement disorder with an otherwise normal physical exam and neuro exam. Cannot be certain that this doesn't represent a mild CVA.  Plan - MRI brain (also do for follow-up of meningiom)          Add plavix 75mg  daily         Reduce ASA to 81mg  daily           Prednisone 60mg  once daily x 7 ( ref: UpToDate - treatment of Bell's palsy)  Preliminary read on MRI images : no acute changes/CVA          Orders: Prescription Created Electronically [phone]) Radiology Referral (Radiology)  Complete Medication List: 1)  Levothyroxine Sodium 100 Mcg Tabs (Levothyroxine sodium) .Marland Kitchen.. 1 by mouth once daily 2)  Lisinopril 20 Mg Tabs (Lisinopril) .... Take one tablet once daily 3)  Occuvite  .... Take one tablet daily 4)  Hydrochlorothiazide 25 Mg Tabs (Hydrochlorothiazide) .... Take one tablet once daily 5)  Aspirin Low Dose 81 Mg Tabs (Aspirin) .Marland Kitchen.. 1 once daily 6)  Sertraline Hcl 100 Mg Tabs (Sertraline hcl) .Marland Kitchen.. 1 by mouth once daily 7)  Metamucil Plus Calcium Caps (Psyllium-calcium) .... Take 2 tablet by mouth once a day 8)  Prednisone 20 Mg Tabs (Prednisone) .Marland Kitchen.. 1 pot three times a day x 7 9)  Plavix 75 Mg Tabs (Clopidogrel bisulfate) .Marland Kitchen.. 1 once daily  Patient Instructions: 1)  Right facial paralysis - I favor a diagnosis of Bell's Palsy (5th cranial nerve dysfunction) for which we don't have a good explanation. There is little evidence of a stroke, but I cannot be sure. Plan - MRI brain; take prednisone 20mg  three times a day x 7 days to help resolve the Bell's Palsy; reduce aspirin to 81mg  daily; add plavix 75mg  once a day until I know from MRI that there has been no stroke.  Prescriptions: HYDROCHLOROTHIAZIDE 25 MG  TABS (HYDROCHLOROTHIAZIDE) Take one tablet once daily  #30 Each x 5   Entered by:   Ami Bullins CMA   Authorized by:   Jacques Navy MD   Signed by:   Bill Salinas CMA on 11/25/2009   Method used:   Electronically to        [facility]* (retail)       [address]       [place], [place]  [address]       Ph: [phone]       Fax: [phone]   RxID:   [id number] PLAVIX 75 MG TABS (CLOPIDOGREL BISULFATE) 1 once daily  #15 x 0   Entered by:   Lamar Sprinkles, CMA   Authorized by:   Jacques Navy MD   Signed by:   Lamar Sprinkles, CMA on 11/25/2009   Method used:   Electronically to        [facility]* (retail)       [address]       [place], [place]  [id number]       Ph: [phone]  Fax: [phone]   RxID:   [id number] PREDNISONE 20 MG TABS (PREDNISONE) 1 pot three times a day x 7  #21 x 0   Entered and Authorized by:   Jacques Navy MD   Signed by:   Jacques Navy MD on 11/25/2009   Method used:   Electronically to        [facility]* (retail)       [address]       [place], [place]  [address]       Ph: [phone]       Fax: [phone]   RxID:   [id number]

## 2010-09-25 NOTE — Progress Notes (Signed)
  Phone Note Outgoing Call   Reason for Call: Discuss lab or test results Summary of Call: please call - Hgb is stable at 11.5 and close to normal. She should continue iron. Thanks Initial call taken by: Jacques Navy MD,  Jan 12, 2010 10:06 PM  Follow-up for Phone Call        informed pt, when should pt come in follow up office visit Follow-up by: Ami Bullins CMA,  Jan 13, 2010 2:53 PM  Additional Follow-up for Phone Call Additional follow up Details #1::        MORNING!!!!!!!  she should have follow-up CBC in 1 month 285.9. She should come to see me when she needs to or 3 months. Additional Follow-up by: Jacques Navy MD,  Jan 14, 2010 9:46 AM    Additional Follow-up for Phone Call Additional follow up Details #2::    labs put in IDX, called pt to inform her  LMOAM Follow-up by: Ami Bullins CMA,  Jan 14, 2010 3:25 PM  Additional Follow-up for Phone Call Additional follow up Details #3:: Details for Additional Follow-up Action Taken: mailed letter to pt to inform her to come in for labs and follow up appt in 3 months or as needed  Additional Follow-up by: Ami Bullins CMA,  January 28, 2010 8:22 AM

## 2010-09-25 NOTE — Letter (Signed)
   Jessica Haynes [address] [place], [place]  [address] Phone: [phone]      June 23, 2010   Jessica Haynes [address] [place], [place] [address]  RE:  LAB RESULTS  Dear  Ms. Wilmes,  The following is an interpretation of your most recent lab tests.  Please take note of any instructions provided or changes to medications that have resulted from your lab work.   lumbar spine films show mild chronic changes but no severe loss of disk height or arthritic change.  There is no need for further imaging at this time. If you symptoms get worse we will need to consider an MRI study.  Please come see me if you have any questions about these lab results.   Sincerely Yours,    Jacques Navy MD  G LUMBAR SPINE COMPLETE - [phone]   Clinical Data: 77 year old female with left leg pain times 2 months.  No known injury.   LUMBAR SPINE - COMPLETE 4+ VIEW   Comparison: Abdominal radiographs 12/04/2009.   Findings: Normal lumbar segmentation.  Mild levoconvex lumbar scoliosis. Bone mineralization is within normal limits.  Sacrum SI joints are within normal limits.  No pars fracture.  Mild to moderate L5-S1 facet hypertrophy.  Mild diffuse lumbar disc space narrowing, maximal at L2-L3.   IMPRESSION: Lumbar disc and facet degeneration as above. No acute fracture or listhesis identified in the lumbar spine.   Read By:  Augusto Gamble,  M.D.

## 2010-09-25 NOTE — Assessment & Plan Note (Signed)
Summary: FU Natale Milch  #   Vital Signs:  Patient profile:   77 year old female Height:      66 inches (167.64 cm) O2 Sat:      97 % on Room air Pulse rate:   96 / minute BP sitting:   126 / 62  (left arm) Cuff size:   regular  Vitals Entered By: Lucious Groves CMA (April 03, 2010 10:16 AM)  O2 Flow:  Room air CC: Follow up./kb, Lipid Management Is Patient Diabetic? No Pain Assessment Patient in pain? no      Comments Patient notes that no refills are needed at this time./kb   Primary Care Provider:  Keashia Haskins  CC:  Follow up./kb and Lipid Management.  History of Present Illness: Jessica Haynes is an 77 yo Caucasian F presenting for follow up. The patient last visited the office on May 12th for follow up on a 9 day hospitalization in April for GI bleed and anemia. Patient states her stool has fully returned to its normal appearance. The patient denies melena, hematochezia, hematemesis, hematuria, abdominal pain, chest pain, syncope or weakness. The patient reports right eye irritation, vertigo, urinary frequency, and dyspnea on exertion.   Lipid Management History:      Positive NCEP/ATP III risk factors include female age 28 years old or older, HDL cholesterol less than 40, and hypertension.  Negative NCEP/ATP III risk factors include non-tobacco-user status.    Current Medications (verified): 1)  Levothyroxine Sodium 100 Mcg  Tabs (Levothyroxine Sodium) .Marland Kitchen.. 1 By Mouth Once Daily 2)  Lisinopril 20 Mg  Tabs (Lisinopril) .... Take One Tablet Once Daily 3)  Occuvite .... Take One Tablet Daily 4)  Hydrochlorothiazide 25 Mg  Tabs (Hydrochlorothiazide) .... Take One Tablet Once Daily 5)  Aspirin Low Dose 81 Mg Tabs (Aspirin) .Marland Kitchen.. 1 Once Daily 6)  Sertraline Hcl 100 Mg  Tabs (Sertraline Hcl) .Marland Kitchen.. 1 By Mouth Once Daily 7)  Metamucil Plus Calcium   Caps (Psyllium-Calcium) .... Take 2 Tablet By Mouth Once A Day 8)  Plavix 75 Mg Tabs (Clopidogrel Bisulfate) .Marland Kitchen.. 1 Once Daily 9)  Prilosec 40 Mg  Cpdr (Omeprazole) .Marland Kitchen.. 1 Daily  Allergies (verified): 1)  ! Penicillin 2)  ! Biaxin 3)  ! Neomycin  Past History:  Past Medical History: Last updated: 05/13/2007 SKIN CANCER, HX OF (ICD-V10.83) Hx of MENINGIOMA (ICD-225.2) HEPATIC CYST (ICD-573.8) HYPERTROPHY, KIDNEY LEFT (ICD-593.1) IRRITABLE BLADDER (ICD-596.8) HYPERLIPIDEMIA (ICD-272.4) HYPOTHYROIDISM (ICD-244.9) HYPERTENSION (ICD-401.9)    Past Surgical History: Last updated: 05/13/2007 * MASTOID SURGERY FOR MENINGIOMA CATARACT EXTRACTION, HX OF (ICD-V45.61)  Family History: Last updated: 10/19/2007 non-contributory.  Social History: recently widowed after 62 years.  She just recently sold off many household goods and had a finality to her loss.  Lives in independent living at the [facility]: DNR, patient would want mechanical ventilation, dialysis artificial nutrition and hydration as long as she maintained cognitive function. She states she would not want major surgery.  Review of Systems       The patient complains of decreased hearing, dyspnea on exertion, and incontinence.  The patient denies anorexia, fever, weight loss, weight gain, vision loss, chest pain, syncope, prolonged cough, headaches, hemoptysis, abdominal pain, melena, hematochezia, severe indigestion/heartburn, hematuria, suspicious skin lesions, depression, and unusual weight change.         positive - vertigo when lying backwards  Physical Exam  General:  alert and normal appearance.   Head:  normocephalic and atraumatic. Left facial droop. Eyes:  pupils equal,  pupils round, and pupils reactive to light.   Ears:  R ear chronic irritation (redness) and minimal amount of cerumen. L ear minimal amount of cerumen. Nose:  no external deformity, no external erythema, and no nasal discharge.   Mouth:  pharynx pink and moist, no erythema, and no exudates.   Neck:  supple and no masses.   Chest Wall:  no deformities, no tenderness, and no  mass.   Lungs:  normal respiratory effort, no accessory muscle use, normal breath sounds, no crackles, and no wheezes.   Heart:  normal rate, regular rhythm, no murmur, no gallop, and no rub.   Abdomen:  soft, non-tender, no distention, and no masses.   Msk:  no joint tenderness and no joint swelling.   Pulses:  R radial normal and L radial normal.   Neurologic:  gait normal.   Skin:  turgor normal, color normal, no rashes, and no suspicious lesions.   Psych:  normally interactive, good eye contact, not anxious appearing, and not depressed appearing.     Impression & Recommendations:  Problem # 1:  DYSPNEA ON EXERTION (ICD-786.09) assessment - Patient suffers from dyspnea on exertion, even while doing household chores. With recent bleed need to be sure she is not suffering recurrent anemia. Reviewed last CXR from hospitalization rvealing cardiac enalargement and diminished lung volumes - will consider cardiac origin. She demonstrate no respiratory symptoms at rest  plan -  CBC to r/o anemia            if CBC is normal will consider 2D echo to r/o cardiomyopathy            Will continue current cardiac/htn meds.   Orders: Cardiology Referral (Cardiology) TLB-CBC Platelet - w/Differential ([id number]-[id number])  Addendum - Hgb 13.9.  Plan will proceed with 2D echo.  Problem # 2:  URINARY FREQUENCY (ICD-788.41) assessment - Patient suffers from irritible bladder. she has failed medication due to dryness leading to dental damage. Discussed with her and she prefer her current symptoms to a retrial of treatment  plan - Patient decided not to replace her oxybutinin and will move forward scheduling bathroom breaks through out her day for managing the problem.  Problem # 3:  EYE IRRITATION (ICD-360.9) Assessment - Patient's right eye has slight inflammation and redness.  Plan - Continue opthalmology management with Dr. Nile Riggs.  Complete Medication List: 1)  Levothyroxine Sodium 100 Mcg Tabs  (Levothyroxine sodium) .Marland Kitchen.. 1 by mouth once daily 2)  Lisinopril 20 Mg Tabs (Lisinopril) .... Take one tablet once daily 3)  Occuvite  .... Take one tablet daily 4)  Hydrochlorothiazide 25 Mg Tabs (Hydrochlorothiazide) .... Take one tablet once daily 5)  Aspirin Low Dose 81 Mg Tabs (Aspirin) .Marland Kitchen.. 1 once daily 6)  Sertraline Hcl 100 Mg Tabs (Sertraline hcl) .Marland Kitchen.. 1 by mouth once daily 7)  Metamucil Plus Calcium Caps (Psyllium-calcium) .... Take 2 tablet by mouth once a day 8)  Plavix 75 Mg Tabs (Clopidogrel bisulfate) .Marland Kitchen.. 1 once daily 9)  Prilosec 40 Mg Cpdr (Omeprazole) .Marland Kitchen.. 1 daily  Other Orders: TLB-BMP (Basic Metabolic Panel-BMET) (80048-METABOL) TLB-B12 + Folate Pnl ([id number]/FOL)  Lipid Assessment/Plan:      Based on NCEP/ATP III, the patient's risk factor category is "0-1 risk factors".  The patient's lipid goals are as follows: Total cholesterol goal is 200; LDL cholesterol goal is 130; HDL cholesterol goal is 40; Triglyceride goal is 150.

## 2010-09-25 NOTE — Letter (Signed)
   [facility] [address] [place], [place]  [address] Phone: [phone]      May 20, 2010   BRITTANI PURDUM [address] [place], [place] [address]  RE:  LAB RESULTS  Dear  Ms. Carcamo,  The following is an interpretation of your most recent lab tests.  Please take note of any instructions provided or changes to medications that have resulted from your lab work.   2D echo evaluation revealed hyperdynamic ventricle with grade 1 diastolic dysfunction.  This study is really pretty good with possibly mild changes that could cause shortness of breath.  Please come see me if you have any questions about these lab results.   Sincerely Yours,    Jacques Navy MD

## 2010-09-25 NOTE — Assessment & Plan Note (Signed)
Summary: WAKES UP DURING NIGHT WITH LEG PAIN/ GOING ON FOR A WHILE /NWS   Vital Signs:  Patient profile:   77 year old female Height:      66 inches O2 Sat:      96 % on Room air Temp:     97.9 degrees F oral Pulse rate:   87 / minute BP sitting:   142 / 72  (left arm) Cuff size:   regular  Vitals Entered By: Bill Salinas CMA (June 20, 2010 11:56 AM)  O2 Flow:  Room air CC: ov to discuss leg pain waking her during the night/ ab   Primary Care Provider:  Norins  CC:  ov to discuss leg pain waking her during the night/ ab.  History of Present Illness: Patient presents with a c/o pain in the left leg that radiates down from the buttock to lateral/posterior thigh and down the lateral leg. It is only when supine and improves with standing or walking. She does not recall any trauma or strain. She denies paresthesia or motor weakness. She does report that as a young woman she had some type of back trouble and needed to wear a back brace. Her real concern is that these symptoms are associated with a blood clot or transfusion reaction.   Current Medications (verified): 1)  Levothyroxine Sodium 100 Mcg  Tabs (Levothyroxine Sodium) .Marland Kitchen.. 1 By Mouth Once Daily 2)  Lisinopril 20 Mg  Tabs (Lisinopril) .... Take One Tablet Once Daily 3)  Occuvite .... Take One Tablet Daily 4)  Hydrochlorothiazide 25 Mg  Tabs (Hydrochlorothiazide) .... Take One Tablet Once Daily 5)  Aspirin Low Dose 81 Mg Tabs (Aspirin) .Marland Kitchen.. 1 Once Daily 6)  Sertraline Hcl 100 Mg  Tabs (Sertraline Hcl) .Marland Kitchen.. 1 By Mouth Once Daily 7)  Metamucil Plus Calcium   Caps (Psyllium-Calcium) .... Take 2 Tablet By Mouth Once A Day 8)  Plavix 75 Mg Tabs (Clopidogrel Bisulfate) .Marland Kitchen.. 1 Once Daily 9)  Prilosec 40 Mg Cpdr (Omeprazole) .Marland Kitchen.. 1 Daily  Allergies (verified): 1)  ! Penicillin 2)  ! Biaxin 3)  ! Neomycin  Past History:  Past Medical History: Last updated: 05/13/2007 SKIN CANCER, HX OF (ICD-V10.83) Hx of MENINGIOMA  (ICD-225.2) HEPATIC CYST (ICD-573.8) HYPERTROPHY, KIDNEY LEFT (ICD-593.1) IRRITABLE BLADDER (ICD-596.8) HYPERLIPIDEMIA (ICD-272.4) HYPOTHYROIDISM (ICD-244.9) HYPERTENSION (ICD-401.9)    Past Surgical History: Last updated: 05/13/2007 * MASTOID SURGERY FOR MENINGIOMA CATARACT EXTRACTION, HX OF (ICD-V45.61)  Social History: Last updated: 04/03/2010 recently widowed after 62 years.  She just recently sold off many household goods and had a finality to her loss.  Lives in independent living at the [facility]: DNR, patient would want mechanical ventilation, dialysis artificial nutrition and hydration as long as she maintained cognitive function. She states she would not want major surgery.  Review of Systems  The patient denies anorexia, fever, weight loss, weight gain, decreased hearing, hoarseness, chest pain, syncope, peripheral edema, prolonged cough, hemoptysis, abdominal pain, severe indigestion/heartburn, incontinence, muscle weakness, difficulty walking, depression, unusual weight change, and enlarged lymph nodes.    Physical Exam  General:  Well-developed,well-nourished,in no acute distress; alert,appropriate and cooperative throughout examination Head:  normocephalic and atraumatic.   Eyes:  vision grossly intact, pupils equal, and pupils round.   Neck:  supple, full ROM, and no thyromegaly.   Lungs:  normal respiratory effort, normal breath sounds, and no wheezes.   Heart:  normal rate, regular rhythm, and no murmur.   Abdomen:  soft, non-tender, and normal bowel sounds.  Msk:  back exam: able to stand w/o assist, flex to 120 degrees,  nl gait, nl toe/heel walk, nl step-up left leg, nl DTRs at patellar tendon, nl SLR sitting, nl sensation to light touch, no CVAT Pulses:  2+ radial Neurologic:  alert & oriented X3 and cranial nerves II-XII intact.   Skin:  turgor normal, color normal, and no suspicious lesions.   Psych:  Oriented X3, good eye contact, and not  anxious appearing.     Impression & Recommendations:  Problem # 1:  LEG PAIN, LEFT (ICD-729.5) Pain that is primarily when supine and radiates in a sciatica type pattern. Hip exam was normal. Concerned for possible spinal stenosis.  Plan - plain films L-S spine           if loss of disk height or bony spurs will need MRI to r/o spinal stenosis           reassured that this isn't a blood clot or transfusion reaction issue.  Orders: T-Lumbar Spine Complete, 5 Views (71110TC)  addendum - no significant changes on plain films  Plan - no furhter imaging unless symptoms increase.   Complete Medication List: 1)  Levothyroxine Sodium 100 Mcg Tabs (Levothyroxine sodium) .Marland Kitchen.. 1 by mouth once daily 2)  Lisinopril 20 Mg Tabs (Lisinopril) .... Take one tablet once daily 3)  Occuvite  .... Take one tablet daily 4)  Hydrochlorothiazide 25 Mg Tabs (Hydrochlorothiazide) .... Take one tablet once daily 5)  Aspirin Low Dose 81 Mg Tabs (Aspirin) .Marland Kitchen.. 1 once daily 6)  Sertraline Hcl 100 Mg Tabs (Sertraline hcl) .Marland Kitchen.. 1 by mouth once daily 7)  Metamucil Plus Calcium Caps (Psyllium-calcium) .... Take 2 tablet by mouth once a day 8)  Plavix 75 Mg Tabs (Clopidogrel bisulfate) .Marland Kitchen.. 1 once daily 9)  Prilosec 40 Mg Cpdr (Omeprazole) .Marland Kitchen.. 1 daily   Orders Added: 1)  T-Lumbar Spine Complete, 5 Views [71110TC] 2)  Est. Patient Level III [40102]

## 2010-09-25 NOTE — Letter (Signed)
[facility] [address] [place], [place]  [address] Phone: [phone]      April 07, 2010   [name] [id number] [place], [place] [address]  RE:  LAB RESULTS  Dear  Ms. Zackery,  The following is an interpretation of your most recent lab tests.  Please take note of any instructions provided or changes to medications that have resulted from your lab work.  ELECTROLYTES:  Good - no changes needed  KIDNEY FUNCTION TESTS:  Stable - no changes needed    DIABETIC STUDIES:  Good - no changes needed Blood Glucose: 96    CBC:  Good - no changes needed  B12 is in normal range at 298   Labs look OK. Will move ahead with scheduling 2 D echo to evaluate cardiac function as possible cause of Shortness of breath.   Sincerely Yours,    Jacques Navy MD  Patient: Jessica Haynes Note: All result statuses are Final unless otherwise noted.  Tests: (1) BMP (METABOL)   Sodium                    145 mEq/L                   135-145   Potassium                 4.5 mEq/L                   3.5-5.1   Chloride                  107 mEq/L                   96-112   Carbon Dioxide            26 mEq/L                    19-32   Glucose                   96 mg/dL                    96-29   BUN                  [H]  26 mg/dL                    5-28   Creatinine           [H]  1.4 mg/dL                   4.1-3.2   Calcium                   9.9 mg/dL                   4.4-01.0   GFR                       38.95 mL/min                >60  Tests: (2) B12 + Folate Panel (B12/FOL)   Vitamin B12               298 pg/mL                   211-911   Folate  14.7 ng/mL     Deficient  0.4 - 3.4 ng/mL     Indeterminate  3.4 - 5.4 ng/mL     Normal  >5.4 ng/mL  Tests: (3) CBC Platelet w/Diff (CBCD)   White Cell Count          7.6 K/uL                    4.5-10.5   Red Cell Count            4.65 Mil/uL                 3.87-5.11   Hemoglobin                 13.9 g/dL                   16.1-09.6   Hematocrit                40.3 %                      36.0-46.0   MCV                       86.7 fl                     78.0-100.0   MCHC                      34.4 g/dL                   04.5-40.9   RDW                       13.8 %                      11.5-14.6   Platelet Count            266.0 K/uL                  150.0-400.0   Neutrophil %              70.1 %                      43.0-77.0   Lymphocyte %              20.2 %                      12.0-46.0   Monocyte %                7.0 %                       3.0-12.0   Eosinophils%              2.6 %                       0.0-5.0   Basophils %               0.1 %                       0.0-3.0   Neutrophill Absolute      5.3 K/uL  1.4-7.7   Lymphocyte Absolute       1.5 K/uL                    0.7-4.0   Monocyte Absolute         0.5 K/uL                    0.1-1.0  Eosinophils, Absolute                             0.2 K/uL                    0.0-0.7   Basophils Absolute        0.0 K/uL                    0.0-0.1

## 2010-11-11 LAB — CBC
HCT: 24.1 % — ABNORMAL LOW (ref 36.0–46.0)
HCT: 25.8 % — ABNORMAL LOW (ref 36.0–46.0)
HCT: 26.5 % — ABNORMAL LOW (ref 36.0–46.0)
HCT: 27.2 % — ABNORMAL LOW (ref 36.0–46.0)
Hemoglobin: 8.2 g/dL — ABNORMAL LOW (ref 12.0–15.0)
Hemoglobin: 8.6 g/dL — ABNORMAL LOW (ref 12.0–15.0)
Hemoglobin: 9.1 g/dL — ABNORMAL LOW (ref 12.0–15.0)
Hemoglobin: 9.5 g/dL — ABNORMAL LOW (ref 12.0–15.0)
MCHC: 33.7 g/dL (ref 30.0–36.0)
MCHC: 33.9 g/dL (ref 30.0–36.0)
MCV: 88.1 fL (ref 78.0–100.0)
MCV: 88.3 fL (ref 78.0–100.0)
MCV: 89.1 fL (ref 78.0–100.0)
MCV: 89.3 fL (ref 78.0–100.0)
Platelets: 211 10*3/uL (ref 150–400)
Platelets: 212 10*3/uL (ref 150–400)
Platelets: 285 10*3/uL (ref 150–400)
Platelets: 286 10*3/uL (ref 150–400)
Platelets: 338 10*3/uL (ref 150–400)
RBC: 2.73 MIL/uL — ABNORMAL LOW (ref 3.87–5.11)
RBC: 3.18 MIL/uL — ABNORMAL LOW (ref 3.87–5.11)
RDW: 15.8 % — ABNORMAL HIGH (ref 11.5–15.5)
RDW: 15.9 % — ABNORMAL HIGH (ref 11.5–15.5)
RDW: 16.2 % — ABNORMAL HIGH (ref 11.5–15.5)
RDW: 16.6 % — ABNORMAL HIGH (ref 11.5–15.5)
WBC: 6.2 10*3/uL (ref 4.0–10.5)
WBC: 7.1 10*3/uL (ref 4.0–10.5)

## 2010-11-11 LAB — BASIC METABOLIC PANEL
BUN: 14 mg/dL (ref 6–23)
BUN: 38 mg/dL — ABNORMAL HIGH (ref 6–23)
CO2: 28 mEq/L (ref 19–32)
Calcium: 8 mg/dL — ABNORMAL LOW (ref 8.4–10.5)
Chloride: 110 mEq/L (ref 96–112)
Creatinine, Ser: 1.29 mg/dL — ABNORMAL HIGH (ref 0.4–1.2)
Creatinine, Ser: 1.3 mg/dL — ABNORMAL HIGH (ref 0.4–1.2)
GFR calc Af Amer: 48 mL/min — ABNORMAL LOW (ref >60)
GFR calc non Af Amer: 39 mL/min — ABNORMAL LOW (ref >60)
GFR calc non Af Amer: 39 mL/min — ABNORMAL LOW (ref >60)
Glucose, Bld: 131 mg/dL — ABNORMAL HIGH (ref 70–99)
Glucose, Bld: 141 mg/dL — ABNORMAL HIGH (ref 70–99)
Potassium: 3.6 mEq/L (ref 3.5–5.1)
Potassium: 3.6 mEq/L (ref 3.5–5.1)
Sodium: 141 mEq/L (ref 135–145)

## 2010-11-11 LAB — HEMOGLOBIN AND HEMATOCRIT, BLOOD
HCT: 26 % — ABNORMAL LOW (ref 36.0–46.0)
HCT: 26.8 % — ABNORMAL LOW (ref 36.0–46.0)
HCT: 27.4 % — ABNORMAL LOW (ref 36.0–46.0)
Hemoglobin: 8.2 g/dL — ABNORMAL LOW (ref 12.0–15.0)
Hemoglobin: 8.3 g/dL — ABNORMAL LOW (ref 12.0–15.0)
Hemoglobin: 8.9 g/dL — ABNORMAL LOW (ref 12.0–15.0)
Hemoglobin: 8.9 g/dL — ABNORMAL LOW (ref 12.0–15.0)
Hemoglobin: 9 g/dL — ABNORMAL LOW (ref 12.0–15.0)
Hemoglobin: 9.3 g/dL — ABNORMAL LOW (ref 12.0–15.0)
Hemoglobin: 9.4 g/dL — ABNORMAL LOW (ref 12.0–15.0)

## 2010-11-12 LAB — URINE MICROSCOPIC-ADD ON

## 2010-11-12 LAB — URINALYSIS, ROUTINE W REFLEX MICROSCOPIC
Specific Gravity, Urine: 1.02 (ref 1.005–1.030)
Urobilinogen, UA: 0.2 mg/dL (ref 0.0–1.0)

## 2010-11-12 LAB — LIPID PANEL
Cholesterol: 184 mg/dL (ref 0–200)
HDL: 31 mg/dL — ABNORMAL LOW (ref >39)
LDL Cholesterol: 80 mg/dL (ref 0–99)
Total CHOL/HDL Ratio: 5.9 RATIO

## 2010-11-12 LAB — BASIC METABOLIC PANEL
BUN: 71 mg/dL — ABNORMAL HIGH (ref 6–23)
BUN: 94 mg/dL — ABNORMAL HIGH (ref 6–23)
Chloride: 106 mEq/L (ref 96–112)
Creatinine, Ser: 1.7 mg/dL — ABNORMAL HIGH (ref 0.4–1.2)
GFR calc non Af Amer: 25 mL/min — ABNORMAL LOW (ref >60)
Glucose, Bld: 119 mg/dL — ABNORMAL HIGH (ref 70–99)
Glucose, Bld: 120 mg/dL — ABNORMAL HIGH (ref 70–99)
Potassium: 4 mEq/L (ref 3.5–5.1)

## 2010-11-12 LAB — CROSSMATCH: Antibody Screen: NEGATIVE

## 2010-11-12 LAB — DIFFERENTIAL
Basophils Absolute: 0 10*3/uL (ref 0.0–0.1)
Basophils Absolute: 0.1 10*3/uL (ref 0.0–0.1)
Basophils Relative: 0 % (ref 0–1)
Basophils Relative: 1 % (ref 0–1)
Eosinophils Absolute: 0 10*3/uL (ref 0.0–0.7)
Eosinophils Relative: 0 % (ref 0–5)
Eosinophils Relative: 0 % (ref 0–5)
Monocytes Absolute: 1.2 10*3/uL — ABNORMAL HIGH (ref 0.1–1.0)
Monocytes Absolute: 1.2 10*3/uL — ABNORMAL HIGH (ref 0.1–1.0)
Monocytes Relative: 5 % (ref 3–12)

## 2010-11-12 LAB — URINE CULTURE: Colony Count: 50000

## 2010-11-12 LAB — CBC
HCT: 26 % — ABNORMAL LOW (ref 36.0–46.0)
HCT: 32.3 % — ABNORMAL LOW (ref 36.0–46.0)
Hemoglobin: 8.9 g/dL — ABNORMAL LOW (ref 12.0–15.0)
MCHC: 34.3 g/dL (ref 30.0–36.0)
MCHC: 34.4 g/dL (ref 30.0–36.0)
Platelets: 347 10*3/uL (ref 150–400)
RBC: 2.38 MIL/uL — ABNORMAL LOW (ref 3.87–5.11)
RDW: 13.4 % (ref 11.5–15.5)
WBC: 12.9 10*3/uL — ABNORMAL HIGH (ref 4.0–10.5)
WBC: 24.7 10*3/uL — ABNORMAL HIGH (ref 4.0–10.5)

## 2010-11-12 LAB — CULTURE, BLOOD (ROUTINE X 2): Culture: NO GROWTH

## 2010-11-12 LAB — COMPREHENSIVE METABOLIC PANEL
AST: 17 U/L (ref 0–37)
Albumin: 3.7 g/dL (ref 3.5–5.2)
Alkaline Phosphatase: 38 U/L — ABNORMAL LOW (ref 39–117)
Chloride: 104 mEq/L (ref 96–112)
GFR calc Af Amer: 26 mL/min — ABNORMAL LOW (ref >60)
Potassium: 4 mEq/L (ref 3.5–5.1)
Sodium: 140 mEq/L (ref 135–145)
Total Bilirubin: 0.8 mg/dL (ref 0.3–1.2)

## 2010-11-12 LAB — HEMOGLOBIN AND HEMATOCRIT, BLOOD: Hemoglobin: 9.9 g/dL — ABNORMAL LOW (ref 12.0–15.0)

## 2010-11-12 LAB — ABO/RH: ABO/RH(D): B POS

## 2011-01-06 NOTE — Assessment & Plan Note (Signed)
[facility]                           PRIMARY CARE OFFICE NOTE   KAGAN, HIETPAS                    MRN:          [id number]  DATE:04/27/2007                            DOB:          02/02/1923    Ms Jessica Haynes is an 77 year old woman who was seen on March 29, 2007,  after having an episode of slurred speech and right facial droop that  was noticeable by both the patient and her family. She also at that time  reported drooling from both sides of her mouth.  She also had tightness  on the right side of her face.  Her evaluation on that date by myself  and Alda Ponder MS4, was basically an unremarkable examination except  for a slight lack of motion on the right side of her face with forced  grin.  Otherwise, her examination was nonfocal.  The patient was started  on Aggrenox b.i.d. at that time.   The patient returns today reporting she is having significant problems  with extreme fatigue, drowsiness, loss of energy.  She also reports  loose stools.  She has had frequent urination.  She has had voice  changes.  She reports she has had blurry vision.  She has had some  nocturnal leg pain which is relieved by ambulation.   CURRENT MEDICATIONS:  1. Levothyroxine 0.05 mg b.i.d.  2. Lovastatin 40 mg daily.  3. Lisinopril 20 mg daily.  4. Meclizine 25 mg three times a day p.r.n.  5. Metamucil daily.  6. Ocuvite daily.  7. Oxybutynin 1/2 of a 5 mg tablet daily.  8. Hydrochlorothiazide 25 mg daily.  9. Aggrenox as noted.   PAST MEDICAL HISTORY:  Well documented in my note of June 15, 2005,  without significant changes.   Of note, the patient has been followed for meningioma and is status post  resection.  Her last MRI of the brain July 27, 2006, was read as  showing that the lesion spanning the right lower cranial fossa extending  near the right parapharyngeal stasis without significant change.   REVIEW OF SYSTEMS:  Patient reports she is  sleeping poorly with  restlessness.  She has weakness and fatigue as noted.  She has no double  vision.  She has no loss of vision.  She does have blurry vision.  No  ear, nose and throat, cardiovascular or respiratory complaints.  GASTROINTESTINAL:  With change in bowel habit with looser stool as noted  above.  GENITOURINARY:  Significant for increased urinary frequency  although she has reduced her oxybutynin dose.  No specific  musculoskeletal pains.   PHYSICAL EXAMINATION:  Temperature 96.8, weight was not obtained, blood  pressure 125/65, pulse was 90.  GENERAL APPEARANCE:  This is a heavy-set Caucasian female who looks her  stated age.  In no acute distress.  HEENT:  Unremarkable with no signs of trauma or injury.  CHEST:  Clear.  CARDIOVASCULAR:  Two plus radial pulses.  Her precordium was quiet.  Her  heart sounds were regular.  NEUROLOGICAL EXAMINATION:  Patient is awake, alert.  She is oriented to  person, place, time and context.  Her speech is clear and easily  understandable.  Her syntax is normal.  Her cognitive ability seems  normal.  Cranial nerves II through XII.  Patient has a very mild  flattening of the right nasal labial fold and with forced grin there is  decreased movement on the right side of her mouth.  Otherwise, her  extraocular muscles are intact.  She had no deviation of the tongue or  uvula.  Pupils equal, round and reactive.  Funduscopic examination was  deferred.  Motor strength was 5/5 throughout and equal.  Cerebellar  function:  Patient has the ability to stand.  She has good balance and  station.  She is able to gait, toe walk and heel walk without  difficulty.  She demonstrated no ataxia.  She was able to step off of  the examination table without difficulty.  DTRs were 2+ and symmetrical  at the biceps and radial tendons.  Was unable to illicit a good reflex  at the patellar tendon.  No further examination conducted.   ASSESSMENT AND PLAN:  1.  Hypertension.  Patient with excellent control.  At this time, we      shall continue on her present medication.  2. Hypothyroid disease.  Patient is due for followup laboratory.  Last      lab in November of 2007 revealed the patient did have a mildly      depressed free T4 0.8 with normal range being 0.9 to 1.8.  Will      adjust her medications as indicated based on repeat laboratory.  3. Hyperlipidemia.  Patient is due for a lipid panel.  She was poorly      controlled at last examination November of 2007 with an LDL      cholesterol of 156 despite being on lovastatin 40.  4. Irritable bladder.  Patient has increased urinary frequency and      nocturia but she has reduced her oxybutynin.  At this point,      because of problems below, I have had her discontinue her      oxybutynin altogether.  5. Neurological:  Patient is presenting with not feeling herself,      fatigue, malaise, change in bowel habit, blurred vision.  I think      many of these symptoms are medication related, specifically related      to oxybutynin itself and possibly related to meclizine.  However,      we cannot rule out that she has had a CVA.  At this point, because      of her perceived adverse reaction to Aggrenox, will have her      discontinue medication and continue with aspirin 325 mg daily.  I      have asked her to stop oxybutynin and meclizine as noted.  Patient      is scheduled for an MRI of the brain on Saturday September 6 at      2:00 p.m. at [facility] to rule out CVA and also to monitor      her meningioma.   Patient was sent for laboratory today as noted including a metabolic  panel so she can have contrast material with her study.   Patient will be notified by phone previous to it with lab results and  MRI results.    Rosalyn Gess Norins, MD  Electronically Signed   MEN/MedQ  DD: 04/27/2007  DT: 04/27/2007  Job #: [id number]

## 2011-01-08 ENCOUNTER — Ambulatory Visit (INDEPENDENT_AMBULATORY_CARE_PROVIDER_SITE_OTHER): Payer: Medicare Other | Admitting: Internal Medicine

## 2011-01-08 ENCOUNTER — Other Ambulatory Visit: Payer: Self-pay | Admitting: Internal Medicine

## 2011-01-08 ENCOUNTER — Encounter: Payer: Self-pay | Admitting: Internal Medicine

## 2011-01-08 VITALS — BP 122/60 | HR 94 | Temp 98.0°F

## 2011-01-08 DIAGNOSIS — H6120 Impacted cerumen, unspecified ear: Secondary | ICD-10-CM

## 2011-01-08 NOTE — Telephone Encounter (Signed)
Rx Done . 

## 2011-01-09 NOTE — Progress Notes (Signed)
  Subjective:    Patient ID: Jessica Haynes, female    DOB: Dec 11, 1922, 77 y.o.   MRN: [id number]  HPI Jessica Haynes presents for treatment of hearing loss in right ear due to wax build up. She can tell when there is cerumen impaction because her hearing aid will buzz.   She has been seen by Dr. Allyne Haynes for macular degeneration and intro-orbital injections have been recommended. She is very leary of treatment and has decided to not have it done. She is advised that despite how it sounds it is generally well tolerated. She is not to be persuaded and will accept the potential for loss of vision OS rather than have treatment.  PMH, FamHx and SocHx reviewed for any changes and relevance.    Review of Systems 10 point review of systems is normal except for hearing loss and decreased vision left eye.     Objective:   Physical Exam Overweight very pleasant white woman in no distress HEENT- right ear with cerumen impaction  Procedure: irrigation and curretage of right ear. Irrigated with water with H2O2 using ear syringe. Using operating head otoscope and curved ear curette successfully removed solid hard plug of cerumen. There was no damage to the [facility]       Assessment & Plan:  1. Macular degeneration - patient will decline intro-orbital injections  2. Cerumen impaction - resolved  Plan - weekly ear care with viscous peroxide and warm water irrigation.

## 2011-02-04 ENCOUNTER — Other Ambulatory Visit: Payer: Self-pay | Admitting: Internal Medicine

## 2011-04-08 ENCOUNTER — Telehealth: Payer: Self-pay | Admitting: *Deleted

## 2011-04-08 DIAGNOSIS — E039 Hypothyroidism, unspecified: Secondary | ICD-10-CM

## 2011-04-08 NOTE — Telephone Encounter (Signed)
Pharm called - they are changing patient from levothroid to brand name synthroid due to shortage of med. Per MD - patient needs f/u TSH labs in 6 to 8 wks after starting brand name med. Need to call pt to inform, labs entered.

## 2011-04-09 NOTE — Telephone Encounter (Signed)
Pt informed to come in for TSH labs 6 to 8 weeks after starting brand name Synthroid

## 2011-06-05 ENCOUNTER — Other Ambulatory Visit (INDEPENDENT_AMBULATORY_CARE_PROVIDER_SITE_OTHER): Payer: Medicare Other

## 2011-06-05 ENCOUNTER — Ambulatory Visit (INDEPENDENT_AMBULATORY_CARE_PROVIDER_SITE_OTHER): Payer: Medicare Other | Admitting: Internal Medicine

## 2011-06-05 VITALS — BP 104/62 | HR 95 | Temp 97.5°F

## 2011-06-05 DIAGNOSIS — H3530 Unspecified macular degeneration: Secondary | ICD-10-CM

## 2011-06-05 DIAGNOSIS — E039 Hypothyroidism, unspecified: Secondary | ICD-10-CM

## 2011-06-05 NOTE — Patient Instructions (Signed)
Macular degeneration: intravitreal injections do help and are well tolerated. You need to ask Dr. Nile Riggs or Dr. Lelon Perla about how rapidly your vision may deteriorate in order to make a good decision.  To reduce the risk of stroke and heart attack - take 81 mg aspirin (baby aspirin) three times a week.  Will check thyroid function today and let you know.   See med list below with annotations.

## 2011-06-05 NOTE — Progress Notes (Signed)
  Subjective:    Patient ID: Jessica Haynes, female    DOB: 20-Nov-1922, 77 y.o.   MRN: [id number]  HPI Jessica Haynes presents for routine follow-up. She has macular dengeration and has been referred to Dr. Shara Blazing for management and intraviteral injections. She has multiple questions and she has many reservations which we discussed at length.  She has otherwise been doing well. She remains independent in all ADLs and seems to be well adjusted.  Past Medical History  Diagnosis Date  . Skin cancer   . Meningioma   . Hepatic cyst   . Hypertrophy of kidney   . Irritable bladder   . Hyperlipidemia   . Hypothyroid   . Hypertension    Past Surgical History  Procedure Date  . Mastoid surgery   . Cataract extraction    No family history on file. History   Social History  . Marital Status: Widowed    Spouse Name: N/A    Number of Children: N/A  . Years of Education: N/A   Occupational History  . Not on file.   Social History Main Topics  . Smoking status: Never Smoker   . Smokeless tobacco: Not on file  . Alcohol Use: No  . Drug Use: Not on file  . Sexually Active: Not on file   Other Topics Concern  . Not on file   Social History Narrative   Recently widowed after 67 yearsShe just recently sold off many household goods and had a finality to her loss.[facility] patient would want mechanical ventilation, dialysis artificial nutrition and hydration as long as she maintained cognitive function. She states she would not want major surgery.       Review of Systems System review is negative for any constitutional, cardiac, pulmonary, GI or neuro symptoms or complaints other than as described in the HPI.     Objective:   Physical Exam Vitals - noted and stable Gen'l - WNWD white woman who looks younger than her chronologic age. HEENT C&S clear Pulm - normal respirations Cor - 2+ radial pulse, RRR       Assessment &  Plan:

## 2011-06-08 DIAGNOSIS — H3530 Unspecified macular degeneration: Secondary | ICD-10-CM | POA: Insufficient documentation

## 2011-06-08 NOTE — Assessment & Plan Note (Signed)
Lab Results  Component Value Date   TSH 2.66 06/05/2011   Good control on present medication - no change needed.

## 2011-06-08 NOTE — Assessment & Plan Note (Signed)
Patient with progressive macular degeneration. She has been referred to Dr. Lelon Perla who is proposing intravitreal injection w/ Avastin. Discussed this with her: known benefit and based on other patient's experiences well tolerated. She is encouraged to ask all her questions of Dr. Lelon Perla and to proceed with treatment.   (greater than 50% of visit 25 min spent on education and counseling)

## 2011-06-10 ENCOUNTER — Other Ambulatory Visit: Payer: Self-pay | Admitting: Internal Medicine

## 2011-06-14 ENCOUNTER — Telehealth: Payer: Self-pay | Admitting: Internal Medicine

## 2011-06-14 NOTE — Telephone Encounter (Signed)
Pleasse call - thyroid function is normal

## 2011-06-15 NOTE — Telephone Encounter (Signed)
Informed pt that thyroid function was normal

## 2011-07-08 ENCOUNTER — Other Ambulatory Visit: Payer: Self-pay | Admitting: Internal Medicine

## 2011-08-31 ENCOUNTER — Other Ambulatory Visit: Payer: Self-pay | Admitting: *Deleted

## 2011-08-31 MED ORDER — LISINOPRIL 20 MG PO TABS: 20.0000 mg | ORAL_TABLET | Freq: Every day | ORAL | Status: DC

## 2011-08-31 MED ORDER — HYDROCHLOROTHIAZIDE 25 MG PO TABS: 25.0000 mg | ORAL_TABLET | Freq: Every day | ORAL | Status: DC

## 2011-12-04 ENCOUNTER — Other Ambulatory Visit: Payer: Self-pay | Admitting: Internal Medicine

## 2011-12-04 NOTE — Telephone Encounter (Signed)
Zoloft request [last refill 10.17.12 #[id number]] Please advise.

## 2011-12-08 ENCOUNTER — Other Ambulatory Visit: Payer: Self-pay | Admitting: *Deleted

## 2011-12-08 MED ORDER — LEVOTHYROXINE SODIUM 100 MCG PO TABS: 100.0000 ug | ORAL_TABLET | Freq: Every day | ORAL | Status: DC

## 2012-04-05 ENCOUNTER — Other Ambulatory Visit: Payer: Self-pay | Admitting: *Deleted

## 2012-04-05 ENCOUNTER — Other Ambulatory Visit: Payer: Self-pay | Admitting: Internal Medicine

## 2012-04-05 MED ORDER — HYDROCHLOROTHIAZIDE 25 MG PO TABS: 25.0000 mg | ORAL_TABLET | Freq: Every day | ORAL | Status: DC

## 2012-04-05 MED ORDER — LISINOPRIL 20 MG PO TABS: 20.0000 mg | ORAL_TABLET | Freq: Every day | ORAL | Status: DC

## 2012-04-12 ENCOUNTER — Encounter: Payer: Self-pay | Admitting: Internal Medicine

## 2012-04-12 ENCOUNTER — Ambulatory Visit (INDEPENDENT_AMBULATORY_CARE_PROVIDER_SITE_OTHER): Payer: Medicare Other | Admitting: Internal Medicine

## 2012-04-12 VITALS — BP 110/60 | HR 90 | Temp 99.1°F | Resp 16

## 2012-04-12 DIAGNOSIS — E785 Hyperlipidemia, unspecified: Secondary | ICD-10-CM

## 2012-04-12 DIAGNOSIS — H6120 Impacted cerumen, unspecified ear: Secondary | ICD-10-CM

## 2012-04-12 DIAGNOSIS — R42 Dizziness and giddiness: Secondary | ICD-10-CM

## 2012-04-12 DIAGNOSIS — E039 Hypothyroidism, unspecified: Secondary | ICD-10-CM

## 2012-04-12 DIAGNOSIS — D320 Benign neoplasm of cerebral meninges: Secondary | ICD-10-CM

## 2012-04-12 DIAGNOSIS — I10 Essential (primary) hypertension: Secondary | ICD-10-CM

## 2012-04-12 DIAGNOSIS — N3289 Other specified disorders of bladder: Secondary | ICD-10-CM

## 2012-04-12 DIAGNOSIS — G479 Sleep disorder, unspecified: Secondary | ICD-10-CM

## 2012-04-12 DIAGNOSIS — E538 Deficiency of other specified B group vitamins: Secondary | ICD-10-CM

## 2012-04-12 MED ORDER — ALPRAZOLAM 0.5 MG PO TBDP: 0.5000 mg | ORAL_TABLET | Freq: Every evening | ORAL | Status: AC | PRN

## 2012-04-12 NOTE — Progress Notes (Signed)
Subjective:    Patient ID: Jessica Haynes, female    DOB: [birth year], 78 y.o.   MRN: [id number]  HPI Jessica Haynes presents with several issues: 1. Ear pain right - no hearing loss but some discomfort with radiation down the throat. No fever, no drainage. Has chronic tinnitus.  2. Incontinence - urgency with leakage, frequency. May be irritable bladder  3. Dizziness - positional dizziness with neck extension, rotation or flexion or position change. No problem with movement or walking.  4. Sleep - has disturbing dreams that can awaken her.   Past Medical History  Diagnosis Date  . Skin cancer   . Meningioma   . Hepatic cyst   . Hypertrophy of kidney   . Irritable bladder   . Hyperlipidemia   . Hypothyroid   . Hypertension    Past Surgical History  Procedure Date  . Mastoid surgery   . Cataract extraction    No family history on file. History   Social History  . Marital Status: Widowed    Spouse Name: N/A    Number of Children: N/A  . Years of Education: N/A   Occupational History  . Not on file.   Social History Main Topics  . Smoking status: Never Smoker   . Smokeless tobacco: Not on file  . Alcohol Use: No  . Drug Use: Not on file  . Sexually Active: Not on file   Other Topics Concern  . Not on file   Social History Narrative   Recently widowed after 49 yearsShe just recently sold off many household goods and had a finality to her loss.[facility] would want mechanical ventilation, dialysis artificial nutrition and hydration as long as she maintained cognitive function. She states she would not want major surgery.    Current Outpatient Prescriptions on File Prior to Visit  Medication Sig Dispense Refill  . hydrochlorothiazide (HYDRODIURIL) 25 MG tablet Take 1 tablet (25 mg total) by mouth daily.  30 tablet  6  . levothyroxine (SYNTHROID) 100 MCG tablet Take 1 tablet (100 mcg total) by mouth daily.   30 each  5  . lisinopril (PRINIVIL,ZESTRIL) 20 MG tablet TAKE 1 TABLET ONCE DAILY.  30 tablet  10  . sertraline (ZOLOFT) 100 MG tablet TAKE 1 TABLET ONCE DAILY.  90 tablet  1  . Multiple Vitamins-Minerals (ICAPS) CAPS Take by mouth daily.           Review of Systems System review is negative for any constitutional, cardiac, pulmonary, GI or neuro symptoms or complaints other than as described in the HPI.     Objective:   Physical Exam Filed Vitals:   04/12/12 1016  BP: 110/60  Pulse: 90  Temp: 99.1 F (37.3 C)  Resp: 16      Gen'l- WNWD white woman looking younger than her stated age HEENT - Cerumen impaction right EAC. Left EAC/TM normal. C&S clear Cor - 2+ radial pulse RRR Pulm - normal respirations Neuro - A&O x 3, CN II-XII normal, normal MS and normal gait.   Procedure Note :     Procedure :  Ear irrigation   Indication:  Cerumen impaction   Risks, including pain, dizziness, eardrum perforation, bleeding, infection and others as well as benefits were explained to the patient in detail. Verbal consent was obtained and the patient agreed to proceed.    We used ear syringe filled with lukewarm water for irrigation. A small amount wax  was recovered. Procedure has also required manual wax removal with an ear curette. Stopped procedure before ear canal cleared due to pain.   Tolerated well but did complain of pain. Complications: None - no visible injury to the [facility]   Postprocedure instructions :  Will refer to ENT         Assessment & Plan:  Cerumen impaction - incompletely cleared Plan Refer to ENT for evacuation of Right EAC.

## 2012-04-12 NOTE — Patient Instructions (Addendum)
1. Ear  - there is a lot of wax and debris in the right ear that did not come out with curettage and irrigation (sorry). Plan  Referral to ENT for taking care of this problem. Also to evaluate for possible connection with facial pain to the meningioma  2. Vertigo  =  Really dizziness with position change. There is not a good medication for this which seems relatively minor  3. Irritable bladder - urgency and frequency. This has been a problem in the past for which we used oxybutynin. This is still a likely diagnosis. Plan  Trial of Myrbetriq 25 mg once a day - if this helps call back for a prescription.  4. Sleep disruption due to over active dreams. Plan  Trial of alprazolam 0.5 mg at bedtime - written Rx provided.

## 2012-04-13 NOTE — Assessment & Plan Note (Signed)
Possible but unlikely that meningioma is causing facial pain.  Plan ENT consult

## 2012-04-13 NOTE — Assessment & Plan Note (Signed)
Mild positional/movement related dizziness, not like positional vertigo. She does have a meningioma but it is unlikely to be symptomatic.  Plan No diagnostic studies unless her symptoms become more pronounced  Care to avoid falls.

## 2012-04-13 NOTE — Assessment & Plan Note (Signed)
Patient complains of a life-long history of very vivid dreaming which disturbs her sleep - early awakening.  Plan  alprazolam 0.5 mg qHS

## 2012-04-13 NOTE — Assessment & Plan Note (Signed)
Symptoms are c/w irritable bladder. This has been a diagnosis in the past and she was treated with oxybutynin.  Plan Trial of Myrbetriq for relief of symptoms.

## 2012-04-13 NOTE — Assessment & Plan Note (Signed)
Lab Results  Component Value Date   TSH 2.66 06/05/2011   Normal value - will be due soon for follow-up lab.

## 2012-04-13 NOTE — Assessment & Plan Note (Signed)
BP Readings from Last 3 Encounters:  04/12/12 110/60  06/05/11 104/62  01/08/11 122/60   Good control on present medications.

## 2012-04-15 ENCOUNTER — Telehealth: Payer: Self-pay | Admitting: Internal Medicine

## 2012-04-15 NOTE — Telephone Encounter (Signed)
Received 3 pages from [facility]. Sent to Dr. Debby Bud. 04/15/12/SD

## 2012-06-03 ENCOUNTER — Other Ambulatory Visit: Payer: Self-pay | Admitting: Internal Medicine

## 2012-06-17 ENCOUNTER — Other Ambulatory Visit: Payer: Self-pay

## 2012-06-17 MED ORDER — SERTRALINE HCL 100 MG PO TABS: 100.0000 mg | ORAL_TABLET | Freq: Every day | ORAL | Status: DC

## 2012-10-10 ENCOUNTER — Other Ambulatory Visit: Payer: Self-pay | Admitting: *Deleted

## 2012-10-11 MED ORDER — ALPRAZOLAM 0.5 MG PO TBDP: ORAL_TABLET | ORAL | Status: DC

## 2012-11-01 ENCOUNTER — Other Ambulatory Visit: Payer: Self-pay | Admitting: Internal Medicine

## 2012-11-07 ENCOUNTER — Ambulatory Visit (INDEPENDENT_AMBULATORY_CARE_PROVIDER_SITE_OTHER): Payer: Medicare Other | Admitting: Internal Medicine

## 2012-11-07 ENCOUNTER — Encounter: Payer: Self-pay | Admitting: Internal Medicine

## 2012-11-07 VITALS — BP 132/68 | HR 86 | Temp 97.5°F | Resp 12

## 2012-11-07 DIAGNOSIS — H6121 Impacted cerumen, right ear: Secondary | ICD-10-CM

## 2012-11-07 DIAGNOSIS — M26629 Arthralgia of temporomandibular joint, unspecified side: Secondary | ICD-10-CM

## 2012-11-07 DIAGNOSIS — H6120 Impacted cerumen, unspecified ear: Secondary | ICD-10-CM

## 2012-11-07 NOTE — Patient Instructions (Addendum)
Ear wax build up - cleared with irrigation. The ear canal looks normal. The Eardrum is a little thickened.  TMJ - you have tenderness over the TMJ joint right and this is the most likely cause of your pain. Plan Aspercreme to rub over the external aspect of the joint  Cut meats, etc into smaller bites.  OverActiveBladder (OAB) - A new medication that has much less drying effect - Myrbetriq 25 mg once a day may help Plan Sample of Myrbetriq - let me know if it helps and an Rx can be sent to your pharmacy.   Temporomandibular Problems  Temporomandibular joint (TMJ) dysfunction means there are problems with the joint between your jaw and your skull. This is a joint lined by cartilage like other joints in your body but also has a small disc in the joint which keeps the bones from rubbing on each other. These joints are like other joints and can get inflamed (sore) from arthritis and other problems. When this joint gets sore, it can cause headaches and pain in the jaw and the face. CAUSES  Usually the arthritic types of problems are caused by soreness in the joint. Soreness in the joint can also be caused by overuse. This may come from grinding your teeth. It may also come from mis-alignment in the joint. DIAGNOSIS Diagnosis of this condition can often be made by history and exam. Sometimes your caregiver may need X-rays or an MRI scan to determine the exact cause. It may be necessary to see your dentist to determine if your teeth and jaws are lined up correctly. TREATMENT  Most of the time this problem is not serious; however, sometimes it can persist (become chronic). When this happens medications that will cut down on inflammation (soreness) help. Sometimes a shot of cortisone into the joint will be helpful. If your teeth are not aligned it may help for your dentist to make a splint for your mouth that can help this problem. If no physical problems can be found, the problem may come from tension. If  tension is found to be the cause, biofeedback or relaxation techniques may be helpful. HOME CARE INSTRUCTIONS   Later in the day, applications of ice packs may be helpful. Ice can be used in a plastic bag with a towel around it to prevent frostbite to skin. This may be used about every 2 hours for 20 to 30 minutes, as needed while awake, or as directed by your caregiver.  Only take over-the-counter or prescription medicines for pain, discomfort, or fever as directed by your caregiver.  If physical therapy was prescribed, follow your caregiver's directions.  Wear mouth appliances as directed if they were given. Document Released: 05/05/2001 Document Revised: 11/02/2011 Document Reviewed: 08/12/2008 [facility] Patient Information 2013 [place], [place].    Overactive Bladder, Adult The bladder has two functions that are totally opposite of the other. One is to relax and stretch out so it can store urine (fills like a balloon), and the other is to contract and squeeze down so that it can empty the urine that it has stored. Proper functioning of the bladder is a complex mixing of these two functions. The filling and emptying of the bladder can be influenced by:  The bladder.  The spinal cord.  The brain.  The nerves going to the bladder.  Other organs that are closely related to the bladder such as prostate in males and the vagina in females. As your bladder fills with urine, nerve signals  are sent from the bladder to the brain to tell you that you may need to urinate. Normal urination requires that the bladder squeeze down with sufficient strength to empty the bladder, but this also requires that the bladder squeeze down sufficiently long to finish the job. In addition the sphincter muscles, which normally keep you from leaking urine, must also relax so that the urine can pass. Coordination between the bladder muscle squeezing down and the sphincter muscles relaxing is required to make everything  happen normally. With an overactive bladder sometimes the muscles of the bladder contract unexpectedly and involuntarily and this causes an urgent need to urinate. The normal response is to try to hold urine in by contracting the sphincter muscles. Sometimes the bladder contracts so strongly that the sphincter muscles cannot stop the urine from passing out and incontinence occurs. This kind of incontinence is called urge incontinence. Having an overactive bladder can be embarrassing and awkward. It can keep you from living life the way you want to. Many people think it is just something you have to put up with as you grow older or have certain health conditions. In fact, there are treatments that can help make your life easier and more pleasant. CAUSES  Many things can cause an overactive bladder. Possibilities include:  Urinary tract infection or infection of nearby tissues such as the prostate.  Prostate enlargement.  In women, multiple pregnancies or surgery on the uterus or urethra.  Bladder stones, inflammation or tumors.  Caffeine.  Alcohol.  Medications. For example, diuretics (drugs that help the body get rid of extra fluid) increase urine production. Some other medicines must be taken with lots of fluids.  Muscle or nerve weakness. This might be the result of a spinal cord injury, a stroke, multiple sclerosis or Parkinson's disease.  Diabetes can cause a high urine volume which fills the bladder so quickly that the normal urge to urinate is triggered very strongly. SYMPTOMS   Loss of bladder control. You feel the need to urinate and cannot make your body wait.  Sudden, strong urges to urinate.  Urinating 8 or more times a day.  Waking up to urinate two or more times a night. DIAGNOSIS  To decide if you have overactive bladder, your healthcare provider will probably:  Ask about symptoms you have noticed.  Ask about your overall health. This will include questions about  any medications you are taking.  Do a physical examination. This will help determine if there are obvious blockages or other problems.  Order some tests. These might include:  A blood test to check for diabetes or other health issues that could be contributing to the problem.  Urine testing. This could measure the flow of urine and the pressure on the bladder.  A test of your neurological system (the brain, spinal cord and nerves). This is the system that senses the need to urinate. Some of these tests are called flow tests, bladder pressure tests and electrical measurements of the sphincter muscle.  A bladder test to check whether it is emptying completely when you urinate.  Cytoscopy. This test uses a thin tube with a tiny camera on it. It offers a look inside your urethra and bladder to see if there are problems.  Imaging tests. You might be given a contrast dye and then asked to urinate. X-rays are taken to see how your bladder is working. TREATMENT  An overactive bladder can be treated in many ways. The treatment will depend on  the cause. Whether you have a mild or severe case also makes a difference. Often, treatment can be given in your healthcare provider's office or clinic. Be sure to discuss the different options with your caregiver. They include:  Behavioral treatments. These do not involve medication or surgery:  Bladder training. For this, you would follow a schedule to urinate at regular intervals. This helps you learn to control the urge to urinate. At first, you might be asked to wait a few minutes after feeling the urge. In time, you should be able to schedule bathroom visits an hour or more apart.  Kegel exercises. These exercises strengthen the pelvic floor muscles, which support the bladder. By toning these muscles, they can help control urination, even if the bladder muscles are overactive. A specialist will teach you how to do these exercises correctly. They will require  daily practice.  Weight loss. If you are obese or overweight, losing weight might stop your bladder from being overactive. Talk to your healthcare provider about how many pounds you should lose. Also ask if there is a specific program or method that would work best for you.  Diet change. This might be suggested if constipation is making your overactive bladder worse. Your healthcare provider or a nutritionist can explain ways to change what you eat to ease constipation. Other people might need to take in less caffeine or alcohol. Sometimes drinking fewer fluids is needed, too.  Protection. This is not an actual treatment. But, you could wear special pads to take care of any leakage while you wait for other treatments to take effect. This will help you avoid embarrassment.  Physical treatments.  Electrical stimulation. Electrodes will send gentle pulses to the nerves or muscles that help control the bladder. The goal is to strengthen them. Sometimes this is done with the electrodes outside of the body. Or, they might be placed inside the body (implanted). This treatment can take several months to have an effect.  Medications. These are usually used along with other treatments. Several medicines are available. Some are injected into the muscles involved in urination. Others come in pill form. Medications sometimes prescribed include:  Anticholinergics. These drugs block the signals that the nerves deliver to the bladder. This keeps it from releasing urine at the wrong time. Researchers think the drugs might help in other ways, too.  Imipramine. This is an antidepressant. But, it relaxes bladder muscles.  Botox. This is still experimental. Some people believe that injecting it into the bladder muscles will relax them so they work more normally. It has also been injected into the sphincter muscle when the sphincter muscle does not open properly. This is a temporary fix, however. Also, it might make  matters worse, especially in older people.  Surgery.  A device might be implanted to help manage your nerves. It works on the nerves that signal when you need to urinate.  Surgery is sometimes needed with electrical stimulation. If the electrodes are implanted, this is done through surgery.  Sometimes repairs need to be made through surgery. For example, the size of the bladder can be changed. This is usually done in severe cases only. HOME CARE INSTRUCTIONS   Take any medications your healthcare provider prescribed or suggested. Follow the directions carefully.  Practice any lifestyle changes that are recommended. These might include:  Drinking less fluid or drinking at different times of the day. If you need to urinate often during the night, for example, you may need to stop  drinking fluids early in the evening.  Cutting down on caffeine or alcohol. They can both make an overactive bladder worse. Caffeine is found in coffee, tea and sodas.  Doing Kegel exercises to strengthen muscles.  Losing weight, if that is recommended.  Eating a healthy and balanced diet. This will help you avoid constipation.  Keep a journal or a log. You might be asked to record how much you drink and when, and also when you feel the need to urinate.  Learn how to care for implants or other devices, such as pessaries. SEEK MEDICAL CARE IF:   Your overactive bladder gets worse.  You feel increased pain or irritation when you urinate.  You notice blood in your urine.  You have questions about any medications or devices that your healthcare provider recommended.  You notice blood, pus or swelling at the site of any test or treatment procedure.  You have an oral temperature above 102 F (38.9 C). SEEK IMMEDIATE MEDICAL CARE IF:  You have an oral temperature above 102 F (38.9 C), not controlled by medicine. Document Released: 06/06/2009 Document Revised: 11/02/2011 Document Reviewed:  06/06/2009 [facility] Patient Information 2013 [place], [place].

## 2012-11-07 NOTE — Progress Notes (Signed)
Subjective:    Patient ID: Jessica Haynes, female    DOB: [birth year], 79 y.o.   MRN: [id number]  HPI Jessica Haynes presents for evaluation of pain about the right ear as well as decreased hearing with a concern for cerumen impaction. She has had no injury. She has not been told by her dentist of malocclusion  She has a h/o OAB and did respond to Oxybutynin but her dentist warned of loss of enamel due to xerostomia. She reports that urinary frequency is a problem and she would like to try another medication.  Past Medical History  Diagnosis Date  . Skin cancer   . Meningioma   . Hepatic cyst   . Hypertrophy of kidney   . Irritable bladder   . Hyperlipidemia   . Hypothyroid   . Hypertension    Past Surgical History  Procedure Laterality Date  . Mastoid surgery    . Cataract extraction     History reviewed. No pertinent family history. History   Social History  . Marital Status: Widowed    Spouse Name: N/A    Number of Children: N/A  . Years of Education: N/A   Occupational History  . Not on file.   Social History Main Topics  . Smoking status: Never Smoker   . Smokeless tobacco: Not on file  . Alcohol Use: No  . Drug Use: Not on file  . Sexually Active: Not on file   Other Topics Concern  . Not on file   Social History Narrative   Recently widowed after 62 years   She just recently sold off many household goods and had a finality to her loss.   [facility]   End Of Life DNR patient would want mechanical ventilation, dialysis artificial nutrition and hydration as long as she maintained cognitive function. She states she would not want major surgery.    Current Outpatient Prescriptions on File Prior to Visit  Medication Sig Dispense Refill  . ALPRAZolam (NIRAVAM) 0.5 MG dissolvable tablet Take 1 tablet at bedtime as needed for anxiety.  30 tablet  5  . aspirin 81 MG tablet Take 81 mg by mouth 3 (three) times a week.      .  hydrochlorothiazide (HYDRODIURIL) 25 MG tablet TAKE 1 TABLET ONCE DAILY.  30 tablet  5  . lisinopril (PRINIVIL,ZESTRIL) 20 MG tablet TAKE 1 TABLET ONCE DAILY.  30 tablet  5  . Multiple Vitamins-Minerals (ICAPS) CAPS Take by mouth daily.        . sertraline (ZOLOFT) 100 MG tablet Take 1 tablet (100 mg total) by mouth daily.  90 tablet  1  . SYNTHROID 100 MCG tablet TAKE 1 TABLET ONCE DAILY.  30 tablet  10   No current facility-administered medications on file prior to visit.      Review of Systems Current Outpatient Prescriptions on File Prior to Visit  Medication Sig Dispense Refill  . ALPRAZolam (NIRAVAM) 0.5 MG dissolvable tablet Take 1 tablet at bedtime as needed for anxiety.  30 tablet  5  . aspirin 81 MG tablet Take 81 mg by mouth 3 (three) times a week.      . hydrochlorothiazide (HYDRODIURIL) 25 MG tablet TAKE 1 TABLET ONCE DAILY.  30 tablet  5  . lisinopril (PRINIVIL,ZESTRIL) 20 MG tablet TAKE 1 TABLET ONCE DAILY.  30 tablet  5  . Multiple Vitamins-Minerals (ICAPS) CAPS Take by mouth daily.        . sertraline (  ZOLOFT) 100 MG tablet Take 1 tablet (100 mg total) by mouth daily.  90 tablet  1  . SYNTHROID 100 MCG tablet TAKE 1 TABLET ONCE DAILY.  30 tablet  10   No current facility-administered medications on file prior to visit.       Objective:   Physical Exam Filed Vitals:   11/07/12 1120  BP: 132/68  Pulse: 86  Temp: 97.5 F (36.4 C)  Resp: 12   Wt Readings from Last 3 Encounters:  No data found for Wt   Gen'l- Elderly white woman in no distress HEENT - cerumen impaction right. Tender to palpation over the right TMJ. Mild crepitus on palpation. Cor- RRR Pulm - normal respirations   Procedure Note :     Procedure :  Ear irrigation   Indication:  Cerumen impaction   Risks, including pain, dizziness, eardrum perforation, bleeding, infection and others as well as benefits were explained to the patient in detail. Verbal consent was obtained and the patient  agreed to proceed.    We used Ear Irrigation syringe with lukewarm water for irrigation. A large amount wax was recovered. Procedure has also required manual wax removal with an ear loop.   Tolerated well. Complications: None.   Postprocedure instructions :  Call if problems.       Assessment & Plan:  1. Cerumen impaction - irrigated w/o difficulty with the use of a curette and irrigation syringe  2. TMJ pain - patient with pain in the right perauricular area with tenderness over the TMJ Plan Use of aspercreme externally  Small bites.

## 2012-12-05 ENCOUNTER — Other Ambulatory Visit: Payer: Self-pay

## 2012-12-05 MED ORDER — SERTRALINE HCL 100 MG PO TABS: 100.0000 mg | ORAL_TABLET | Freq: Every day | ORAL | Status: DC

## 2013-01-25 ENCOUNTER — Telehealth: Payer: Self-pay | Admitting: Internal Medicine

## 2013-01-25 DIAGNOSIS — N939 Abnormal uterine and vaginal bleeding, unspecified: Secondary | ICD-10-CM

## 2013-01-25 NOTE — Telephone Encounter (Signed)
Jessica Haynes wants to be referred to a female GYN who takes [employer].  She started spotting 2 wks ago for a day.  It started again today.  She would not feel comfortable with a female GYN.

## 2013-01-26 NOTE — Telephone Encounter (Signed)
Referral request placed.

## 2013-01-27 ENCOUNTER — Encounter: Payer: Self-pay | Admitting: Internal Medicine

## 2013-01-27 ENCOUNTER — Other Ambulatory Visit (INDEPENDENT_AMBULATORY_CARE_PROVIDER_SITE_OTHER): Payer: Medicare Other

## 2013-01-27 ENCOUNTER — Ambulatory Visit (INDEPENDENT_AMBULATORY_CARE_PROVIDER_SITE_OTHER): Payer: Medicare Other | Admitting: Internal Medicine

## 2013-01-27 VITALS — BP 136/68 | HR 94 | Temp 97.9°F

## 2013-01-27 DIAGNOSIS — N950 Postmenopausal bleeding: Secondary | ICD-10-CM

## 2013-01-27 LAB — CBC WITH DIFFERENTIAL/PLATELET
Basophils Relative: 0.2 % (ref 0.0–3.0)
Eosinophils Relative: 2 % (ref 0.0–5.0)
HCT: 40.4 % (ref 36.0–46.0)
Hemoglobin: 13.7 g/dL (ref 12.0–15.0)
Lymphs Abs: 1.5 10*3/uL (ref 0.7–4.0)
MCV: 89.2 fl (ref 78.0–100.0)
Monocytes Absolute: 0.4 10*3/uL (ref 0.1–1.0)
Monocytes Relative: 5.5 % (ref 3.0–12.0)
Neutro Abs: 5.1 10*3/uL (ref 1.4–7.7)
RBC: 4.53 Mil/uL (ref 3.87–5.11)
WBC: 7.1 10*3/uL (ref 4.5–10.5)

## 2013-01-27 LAB — BASIC METABOLIC PANEL
BUN: 25 mg/dL — ABNORMAL HIGH (ref 6–23)
CO2: 31 mEq/L (ref 19–32)
Calcium: 9.7 mg/dL (ref 8.4–10.5)
Creatinine, Ser: 1.5 mg/dL — ABNORMAL HIGH (ref 0.4–1.2)
Glucose, Bld: 98 mg/dL (ref 70–99)
Sodium: 142 mEq/L (ref 135–145)

## 2013-01-27 NOTE — Progress Notes (Signed)
Subjective:    Patient ID: Jessica Haynes, female    DOB: 1922/11/24, 80 y.o.   MRN: [id number]  HPI  Pt presents to the clinic today with c/o vaginal spotting x 2 weeks. She is postmenopausal. She denies pain with the spotting. The discharge is brown-burgandy. She has not had any pain with this spotting. She has no family history of vulvar, uterine or ovarian cancer.  Review of Systems      Past Medical History  Diagnosis Date  . Skin cancer   . Meningioma   . Hepatic cyst   . Hypertrophy of kidney   . Irritable bladder   . Hyperlipidemia   . Hypothyroid   . Hypertension     Current Outpatient Prescriptions  Medication Sig Dispense Refill  . ALPRAZolam (NIRAVAM) 0.5 MG dissolvable tablet Take 1 tablet at bedtime as needed for anxiety.  30 tablet  5  . AMBULATORY NON FORMULARY MEDICATION Macular Degeneration capsules  2 daily      . aspirin 81 MG tablet Take 81 mg by mouth 3 (three) times a week.      . hydrochlorothiazide (HYDRODIURIL) 25 MG tablet TAKE 1 TABLET ONCE DAILY.  30 tablet  5  . lisinopril (PRINIVIL,ZESTRIL) 20 MG tablet TAKE 1 TABLET ONCE DAILY.  30 tablet  5  . psyllium (METAMUCIL) 0.52 G capsule Take 0.52 g by mouth daily.      . sertraline (ZOLOFT) 100 MG tablet Take 1 tablet (100 mg total) by mouth daily.  90 tablet  1  . SYNTHROID 100 MCG tablet TAKE 1 TABLET ONCE DAILY.  30 tablet  10   No current facility-administered medications for this visit.    Allergies  Allergen Reactions  . Clarithromycin     REACTION: causes swelling  . Neomycin     REACTION: causes itching and redness  . Penicillins     REACTION: causes swelling    History reviewed. No pertinent family history.  History   Social History  . Marital Status: Widowed    Spouse Name: N/A    Number of Children: N/A  . Years of Education: N/A   Occupational History  . Not on file.   Social History Main Topics  . Smoking status: Never Smoker   . Smokeless tobacco: Not on file   . Alcohol Use: No  . Drug Use: Not on file  . Sexually Active: Not on file   Other Topics Concern  . Not on file   Social History Narrative   Recently widowed after 62 years   She just recently sold off many household goods and had a finality to her loss.   [facility] in [facility]   End Of Life DNR patient would want mechanical ventilation, dialysis artificial nutrition and hydration as long as she maintained cognitive function. She states she would not want major surgery.     Constitutional: Denies fever, malaise, fatigue, headache or abrupt weight changes.  Respiratory: Denies difficulty breathing, shortness of breath, cough or sputum production.   Cardiovascular: Denies chest pain, chest tightness, palpitations or swelling in the hands or feet.  GU: Pt reports vaginal spotting. Denies urgency, frequency, pain with urination, burning sensation, blood in urine, odor or discharge.   No other specific complaints in a complete review of systems (except as listed in HPI above).  Objective:   Physical Exam   BP 136/68  Pulse 94  Temp(Src) 97.9 F (36.6 C) (Oral)  SpO2 98% Wt Readings from  Last 3 Encounters:  No data found for Wt    General: Appears her stated age, well developed, well nourished in NAD. Cardiovascular: Normal rate and rhythm. S1,S2 noted.  No murmur, rubs or gallops noted. No JVD or BLE edema. No carotid bruits noted. Pulmonary/Chest: Normal effort and positive vesicular breath sounds. No respiratory distress. No wheezes, rales or ronchi noted.  Abdomen: Soft and nontender. Normal bowel sounds, no bruits noted. No distention or masses noted. GYN exam deffered today      Assessment & Plan:   Post menopausal bleeding, new onset:  Will check labs today Will call [facility] to see if she can be seen sooner  Will f/u after labs

## 2013-01-27 NOTE — Patient Instructions (Signed)
Postmenopausal Bleeding Menopause is commonly referred to as the "change in life." It is a time when the fertile years, the time of ovulating and having menstrual periods, has come to an end. It is also determined by not having menstrual periods for 12 months.  Postmenopausal bleeding is any bleeding a woman has after she has entered into menopause. Any type of postmenopausal bleeding, even if it appears to be a typical menstrual period, is concerning. This should be evaluated by your caregiver.  CAUSES   Hormone therapy.  Cancer of the cervix or cancer of the lining of the uterus (endometrial cancer).  Thinning of the uterine lining (uterine atrophy).  Thyroid diseases.  Certain medicines.  Infection of the uterus or cervix.  Inflammation or irritation of the uterine lining (endometritis).  Estrogen-secreting tumors.  Growths (polyps) on the cervix, uterine lining, or uterus.  Uterine tumors (fibroids).  Being very overweight (obese). DIAGNOSIS  Your caregiver will take a medical history and ask questions. A physical exam will also be performed. Further tests may include:   A transvaginal ultrasound. An ultrasound wand or probe is inserted into your vagina to view the pelvic organs.  A biopsy of the lining of the uterus (endometrium). A sample of the endometrium is removed and examined.  A hysteroscopy. Your caregiver may use an instrument with a light and a camera attached to it (hysteroscope). The hysteroscope is used to look inside the uterus for problems.  A dilation and curettage (D&C). Tissue is removed from the uterine lining to be examined for problems. TREATMENT  Treatment depends on the cause of the bleeding. Some treatments include:   Surgery.  Medicines.  Hormones.  A hysteroscopy or D&C to remove polyps or fibroids.  Changing or stopping a current medicine you are taking. Talk to your caregiver about your specific treatment. HOME CARE INSTRUCTIONS    Maintain a healthy weight.  Keep regular pelvic exams and Pap tests. SEEK MEDICAL CARE IF:   You have bleeding, even if it is light in comparison to your previous periods.  Your bleeding lasts more than 1 week.  You have abdominal pain.  You develop bleeding with sexual intercourse. SEEK IMMEDIATE MEDICAL CARE IF:   You have a fever, chills, headache, dizziness, muscle aches, and bleeding.  You have severe pain with bleeding.  You are passing blood clots.  You have bleeding and need more than 1 pad an hour.  You feel faint. MAKE SURE YOU:  Understand these instructions.  Will watch your condition.  Will get help right away if you are not doing well or get worse. Document Released: 11/18/2005 Document Revised: 11/02/2011 Document Reviewed: 04/16/2011 [employer] Patient Information 2014 [employer].  

## 2013-02-21 DIAGNOSIS — C541 Malignant neoplasm of endometrium: Secondary | ICD-10-CM

## 2013-02-21 HISTORY — DX: Malignant neoplasm of endometrium: C54.1

## 2013-02-22 ENCOUNTER — Other Ambulatory Visit (HOSPITAL_COMMUNITY)
Admission: RE | Admit: 2013-02-22 | Discharge: 2013-02-22 | Disposition: A | Discharge status: Home / Self Care | Payer: Medicare Other | Source: Ambulatory Visit | Attending: Obstetrics & Gynecology | Admitting: Obstetrics & Gynecology

## 2013-02-22 ENCOUNTER — Encounter: Payer: Self-pay | Admitting: Obstetrics & Gynecology

## 2013-02-22 ENCOUNTER — Ambulatory Visit (INDEPENDENT_AMBULATORY_CARE_PROVIDER_SITE_OTHER): Payer: Medicare Other | Admitting: Obstetrics & Gynecology

## 2013-02-22 VITALS — BP 145/75 | HR 93 | Temp 97.9°F | Ht 62.0 in

## 2013-02-22 DIAGNOSIS — C549 Malignant neoplasm of corpus uteri, unspecified: Secondary | ICD-10-CM | POA: Insufficient documentation

## 2013-02-22 DIAGNOSIS — Z01419 Encounter for gynecological examination (general) (routine) without abnormal findings: Secondary | ICD-10-CM | POA: Insufficient documentation

## 2013-02-22 DIAGNOSIS — N950 Postmenopausal bleeding: Secondary | ICD-10-CM

## 2013-02-22 DIAGNOSIS — Z1151 Encounter for screening for human papillomavirus (HPV): Secondary | ICD-10-CM | POA: Insufficient documentation

## 2013-02-23 NOTE — Progress Notes (Signed)
ENDOMETRIAL BIOPSY     The indications for endometrial biopsy were reviewed; patient had episode of postmenopausal bleeding.   Risks of the biopsy including cramping, bleeding, infection, uterine perforation, inadequate specimen and need for additional procedures  were discussed. The patient states she understands and agrees to undergo procedure today. Consent was signed. Time out was performed.   A sterile speculum was placed in the patient's vagina and a pap smear of the cervix was done.  The cervix was prepped with Betadine. A single-toothed tenaculum was placed on the anterior lip of the cervix to stabilize it. The 3 mm pipelle was introduced into the endometrial cavity without difficulty to a depth of 7 cm, and there was immediate return of old blood.  Three passes were done to obtain a moderate amount of tissue that was sent to pathology. The instruments were removed from the patient's vagina. Minimal bleeding from the cervix was noted. The patient tolerated the procedure well. Routine post-procedure instructions were given to the patient. The patient will be called with the results and given recommendations for further management.

## 2013-02-23 NOTE — Patient Instructions (Signed)
Return to clinic for any scheduled appointments or for any gynecologic concerns as needed.   

## 2013-02-27 ENCOUNTER — Encounter: Payer: Self-pay | Admitting: *Deleted

## 2013-02-28 ENCOUNTER — Ambulatory Visit (HOSPITAL_COMMUNITY)
Admission: RE | Admit: 2013-02-28 | Discharge: 2013-02-28 | Disposition: A | Discharge status: Home / Self Care | Payer: Medicare Other | Source: Ambulatory Visit | Attending: Obstetrics & Gynecology | Admitting: Obstetrics & Gynecology

## 2013-02-28 ENCOUNTER — Ambulatory Visit (HOSPITAL_COMMUNITY): Payer: Medicare Other

## 2013-02-28 ENCOUNTER — Telehealth: Payer: Self-pay | Admitting: *Deleted

## 2013-02-28 DIAGNOSIS — N950 Postmenopausal bleeding: Secondary | ICD-10-CM | POA: Insufficient documentation

## 2013-02-28 DIAGNOSIS — R9389 Abnormal findings on diagnostic imaging of other specified body structures: Secondary | ICD-10-CM | POA: Insufficient documentation

## 2013-02-28 DIAGNOSIS — N83209 Unspecified ovarian cyst, unspecified side: Secondary | ICD-10-CM | POA: Insufficient documentation

## 2013-02-28 NOTE — Telephone Encounter (Signed)
Patient met with Dr. Penne Lash this morning for results.  Phoned patient and left voicemail message with appointment information.  [facility] 03/02/13 arrive at 11:30 am for 12:00 pm with Dr. Grant Ruts.  Left message for patient to contact Dr. Laurann Montana office at [phone] if appointment is not convenient.

## 2013-02-28 NOTE — Progress Notes (Signed)
  Pt informed today that she has FIGO Grade 1 Endometrial adenocarcinoma.  Pt will be referred to [facility] at [facility]  They were not available by phone today to schedule the appt.  We will call her when they answer they call us back (left a message).  Face to Face time = 15 minutes.

## 2013-03-01 ENCOUNTER — Encounter: Payer: Self-pay | Admitting: Obstetrics & Gynecology

## 2013-03-01 DIAGNOSIS — C541 Malignant neoplasm of endometrium: Secondary | ICD-10-CM | POA: Insufficient documentation

## 2013-03-02 ENCOUNTER — Other Ambulatory Visit: Payer: Self-pay | Admitting: Gynecologic Oncology

## 2013-03-02 ENCOUNTER — Ambulatory Visit: Payer: Medicare Other | Attending: Gynecologic Oncology | Admitting: Gynecologic Oncology

## 2013-03-02 ENCOUNTER — Encounter: Payer: Self-pay | Admitting: Gynecologic Oncology

## 2013-03-02 VITALS — BP 142/64 | HR 66 | Temp 98.1°F | Resp 16 | Ht 62.0 in

## 2013-03-02 DIAGNOSIS — C541 Malignant neoplasm of endometrium: Secondary | ICD-10-CM

## 2013-03-02 NOTE — Patient Instructions (Signed)
Cancer of the Uterus The uterus is part of a woman's reproductive system. It is the hollow, pear-shaped organ where a baby grows. The uterus is in the pelvis between the bladder and the rectum. The narrow, lower portion of the uterus is the cervix. The fallopian tubes extend from either side of the top of the uterus to the ovaries. The wall of the uterus has two layers of tissue. The inner layer, or lining, is the endometrium. The outer layer is muscle tissue called the myometrium. In women of childbearing age, the lining of the uterus grows and thickens each month to prepare for pregnancy. If a woman does not become pregnant, the thick, bloody lining flows out of the body through the vagina. This flow is called menstruation. TYPES OF UTERINE CANCER  The most common type of cancer of the uterus begins in the lining (endometrium). It is called endometrial cancer, uterine cancer, or cancer of the uterus. It is seen in 2% to 3% of women.  A different type of cancer, uterine sarcoma, develops in the muscle (myometrium). Cancer that begins in the cervix is also a different type of cancer.  Rarely, a noncancerous fibroid tumor of the uterus develops into a sarcoma. CAUSES  No one knows the exact causes of uterine cancer. But it is clear that this disease is not contagious. No one can "catch" cancer from another person. Women who get this disease are more likely than other women to have certain risk factors. A risk factor is something that increases a person's chance of developing the disease.  Most women who have known risk factors do not get uterine cancer. On the other hand, many who do get this disease have none of these factors. Doctors can seldom explain why one woman gets uterine cancer and another does not.  Studies have found the following risk factors:  Age. Cancer of the uterus occurs mostly in women over age 50.  Endometrial hyperplasia (enlarged endometrium). The risk of uterine cancer is  higher if a woman has endometrial hyperplasia.  Hormone replacement therapy (HRT). HRT is used to control the symptoms of menopause, to prevent osteoporosis (thinning of the bones), and to reduce the risk of heart disease or stroke. Women who still have their uterus, and use estrogen without progesterone, have an increased risk of uterine cancer. Long-term use and large doses of estrogen seem to increase this risk. Women who use a combination of estrogen and progesterone have a lower risk of uterine cancer than women who use estrogen alone. The progesterone protects the uterus from developing cancer.  Obesity and related conditions. The body stores and releases some of its estrogen in fatty tissue. That is why obese women are more likely than thin women to have higher levels of estrogen in their bodies. High levels of estrogen may be the reason that obese women have an increased risk of developing uterine cancer. The risk of this disease is also higher in women with diabetes or high blood pressure. These conditions occur in many obese women.  Tamoxifen. Women taking the drug tamoxifen to prevent or treat breast cancer have an increased risk of uterine cancer. This risk appears to be related to the estrogen-like effect of this drug on the uterus.  Race. White women are more likely than African-American women to get uterine cancer.  Colorectal cancer. Women who have had an inherited form of colorectal cancer have a higher risk of developing uterine cancer than other women.  Infertility.  Beginning menstrual   periods before age 12.  Having menstrual periods after age 52.  History of cancer of the ovary or intestine.  Family history of uterine cancer.  Having diabetes, high blood pressure, thyroid or gallbladder disease.  Long-term use of high does of birth control pills. Birth control pills today are low in hormone doses.  Radiation to the abdomen or pelvis.  Smoking. SYMPTOMS  Uterine  cancer usually occurs after menopause. But it may also occur around the time that menopause begins. Abnormal vaginal bleeding is the most common symptom of uterine cancer. Bleeding may start as a watery, blood-streaked flow that gradually contains more blood. Women should not assume that abnormal vaginal bleeding is part of menopause. A woman should see her caregiver if she has any of the following symptoms:  Unusual vaginal bleeding or discharge.  Difficult or painful urination.  Pain during intercourse.  Pain in the pelvic area.  Increased girth (growth) of the stomach.  Any vaginal bleeding after menopause.  Unexplained weight loss. These symptoms can be caused by cancer or other less serious conditions. Most often they are not cancer. But a thorough evaluation is needed to be certain. DIAGNOSIS  If a woman has symptoms that suggest uterine cancer, her caregiver may check her general health and may order blood and urine tests. The caregiver also may perform one or more of these exams or tests.  Blood and urine tests and chest x-rays. The woman also may have:  Other X-rays.  CT scans.  Ultrasound test.  Magnetic resonance imaging (MRI).  Sigmoidoscopy.  Colonoscopy.  Pelvic exam. A woman will have a pelvic exam to check the vagina, uterus, bladder, and rectum. The caregiver feels these organs for any lumps or changes in their shape or size. To see the upper part of the vagina and the cervix, the caregiver inserts an instrument called a speculum into the vagina.  Pap test. The caregiver collects cells from the cervix and upper vagina. A medical laboratory checks for abnormal cells. The Pap test is better for detecting cancer of the cervix. But cells from inside the uterus usually do not show up on a Pap test. It is not a reliable test for uterine cancer.  Transvaginal ultrasound. The medical caregiver inserts an instrument into the vagina. The instrument aims high-frequency  sound waves at the uterus. The pattern of the echoes they produce creates a picture. If the endometrium looks too thick, the caregiver can do a biopsy.  Biopsy. The medical caregiver removes a sample of tissue from the uterine lining. This usually can be done in the caregiver's office.  Dilatation and Curettage (D&C). In some cases, a woman may need to have a D&C. D&C is usually done as same-day surgery with anesthesia in a hospital. A pathologist examines the tissue (lining of the uterus) to check for cancer cells and other conditions. STAGING   If uterine cancer is diagnosed, the caregiver needs to know the stage, or extent, of the disease to plan the best treatment. Staging is a careful attempt to find out whether the cancer has spread, and if so, to what parts of the body.  When uterine cancer spreads (metastasizes) outside the uterus, cancer cells are often found in nearby lymph nodes, nerves, or blood vessels. If the cancer has reached the lymph nodes, cancer cells may have spread to other lymph nodes and other organs of the body.  Staging is done at the time of surgery. In most cases, the most reliable way to stage   this disease is to remove the uterus, cervix, tubes, ovaries, and lymph nodes. A pathologist uses a microscope to examine the uterus and other tissues removed by the surgeon, to determine the extent of the cancer in the pelvis.  If lymph nodes have cancer cells, other parts of the body are examined, to see if it has spread to other organs. MAIN FEATURES OF EACH STAGE OF THE DISEASE: Stage I. The cancer is only in the body of the uterus. It is not in the cervix. Stage II. The cancer has spread from the body of the uterus to the cervix. Stage III. The cancer has spread outside the uterus, but not outside the pelvis (and not to the bladder or rectum). Lymph nodes in the pelvis may contain cancer cells. Stage IV. The cancer has spread into the bladder or rectum. It may have spread  beyond the pelvis to other body parts. TREATMENT  Women with uterine cancer have many treatment options. Most women with uterine cancer are treated with surgery. Some have radiation or chemotherapy. A smaller number of women may be treated with hormonal therapy. Some patients receive a combination of therapies. You may want to consult with another cancer doctor for a second opinion. The caregiver (usually a cancer doctor) is the best person to describe your treatment choices and to discuss the expected results of treatment. SURGERY  Most women with uterine cancer have surgery to remove the uterus, cervix, tubes, and ovaries (total hysterectomy). This is usually done through an incision in the abdomen.  The doctor may also remove the lymph nodes near the tumor, to see if they contain cancer. If cancer cells have reached the lymph nodes, it may mean that the disease has spread to other parts of the body. If cancer cells have not spread beyond the endometrium, the woman may not need to have any other treatment. The length of the hospital stay may vary from several days to a week. RADIATION THERAPY  In radiation therapy, high-energy rays are used to kill cancer cells. Like surgery, radiation therapy is a local therapy. It affects cancer cells only in the treated area.  Some women with Stage I, II, or III uterine cancer need both radiation therapy and surgery. They may have radiation before surgery to shrink the tumor, or after surgery to destroy any cancer cells that remain in the area. The doctor may suggest radiation treatments for the small number of women who cannot have surgery.  Doctors use two types of radiation therapy to treat uterine cancer:  External radiation. In external radiation therapy, a large machine outside the body is used to aim radiation at the tumor area. The woman usually does not stay overnight (outpatient) at the hospital or clinic, and receives external radiation 5 days a week  for several weeks. This schedule helps protect healthy cells and tissue by spreading out the total dose of radiation. No radioactive materials are put into the body for external radiation therapy.  Internal radiation. In internal radiation therapy, tiny tubes containing a radioactive substance are inserted through the vagina and cervix, into the uterus, and left in place for a few days. The woman stays in the hospital during this treatment. To protect others from radiation exposure, the patient may not be able to have visitors or may have visitors only for a short period of time while the implant is in place. Once the implant is removed, the woman has no radioactivity in her body.  Some patients need both   external and internal radiation therapies. CHEMOTHERAPY Chemotherapy is not usually used for endometrial cancer of the uterus. However, with sarcoma of the uterus or of the fibroid, it may be used in combination with surgery. Chemotherapy may also be used with recurring sarcoma, and in patients who cannot have surgery. HORMONE THERAPY Hormonal therapy involves substances that prevent cancer cells from multiplying or growing by attaching to hormone receptors. This causes changes in cancer cells. Before therapy begins, the caregiver may request a hormone receptor test. This special lab test of uterine tissue helps the caregiver learn if estrogen and progesterone receptors are present. If the tissue has receptors, the woman is more likely to respond to hormonal therapy.  Hormonal therapy is called a systemic therapy, because it can affect cancer cells throughout the body. Usually, hormonal therapy is a type of progesterone, taken as a pill or injection.  The doctor may use hormonal therapy for women with uterine cancer who are unable to have surgery or radiation therapy. Also, the doctor may give hormonal therapy to women with uterine cancer that has spread to the lungs or other distant sites. It is also  given to women with uterine cancer that has come back.  Hormonal therapy can cause a number of side effects. Women taking progesterone may retain fluid, have an increased appetite, and gain weight. Women who are still menstruating may have changes in their periods.  Hormone therapy can be used in combination with surgery or radiation. HOME CARE INSTRUCTIONS   Maintain a normal weight with a healthy balanced diet and exercise.  If you have diabetes, high blood pressure, thyroid or gallbladder disease, keep them in control with your caregiver's treatment and recommendations.  Do not smoke.  Do not take estrogen without taking progesterone with it, for menopausal symptoms.  Join a support group or get counseling, if you would like help dealing with your cancer.  If you are on hormone replacement therapy, see your caregiver as recommended, and be informed about the side effects of HRT.  Women with known risk factors should ask their caregiver what symptoms to look for and how often they should have an examination.  Keep your follow-up appointments and take your medicines as advised.  Write your questions down, and take them with you to your caregiver's appointments.  You may want another person to be with you for your appointments, so you do not miss any instructions. SEEK MEDICAL CARE IF:   You have any abnormal vaginal bleeding.  You are having menstrual periods at the age of 52 or older.  You have bleeding after sexual intercourse.  You are taking tomoxifen and develop vaginal bleeding.  Your stomach is growing, and you are not pregnant.  You have pain with sexual intercourse.  You have stomach or pelvis pain.  You have weight loss for no known reason.  You have pain or difficulty with urination. [facility]  Cancer Information Service (CIS) provides accurate, up-to-date information on cancer to patients and their families, health professionals, and  the general public:  Phone: 1-800-4-CANCER (1-800-422-6237).  Internet: http://www.cancer.gov NCI's website contains complete information about cancer causes and prevention, screening and diagnosis, treatment and survivorship, clinical trials, statistics, funding, training, and employment opportunities, and the Institute and its programs. CLINICAL TRIALS A woman who is interested in being part of a clinical trial should talk with her caregiver. NCI's website (http://www.cancer.gov) provides general information about clinical trials. It also offers detailed information about specific ongoing studies of uterine   cancer by linking to PDQ, a cancer information database developed by the NCI. The Cancer Information Service at 1-800-4-CANCER can answer questions about cancer and provide information from the PDQ database. Document Released: 08/10/2005 Document Revised: 11/02/2011 Document Reviewed: 06/13/2009 [facility] Patient Information 2014 [place], [place].

## 2013-03-02 NOTE — Progress Notes (Signed)
Consult Note: Gyn-Onc  Jessica Haynes 79 y.o. female  CC:  Chief Complaint  Patient presents with  . Endometrial Adenocarcinoma    New Consult    HPI: Patient is seen today in consultation at the request of Dr. Penne Lash.  This is an 79 year old gravida 1 para 1 who is been menopausal since her 48s. About 2 months ago she began having some spotting that was not painful. She would have a little bit of spotting for a few days and then none for a few weeks and that would occur. This happened 33 times before she was able to seek medical assistance. She underwent endometrial biopsy on July 2 that revealed a grade 1 endometrioid adenocarcinoma. She underwent a transvaginal ultrasound on July 8. Revealed an anteverted in retroflexed uterus measuring 5.7 x 3.4 x 3.6 cm. There is an area of focal thickening of the endometrium associated in the left posterolateral portion of the endometrium measuring 1.5 cm in depth. The myometrial endometrial interface in this location was probably delineated suspicious for associated microinvasion with greater than 50% mural involvement suspected on one evaluation but no definitive spread beyond the serosa seen. Right ovary measured 2.4 x 1.2 x 1.7 cm and contained to the thin walled up centimeters simple cyst. The left ovary was not seen with confidence. She was subsequently referred to [place] for evaluation.  Interval History:  As above  Review of Systems The patient otherwise has no significant complaints. She does complain of urge incontinence her bladder. She does time her voids. She goes to the bathroom about one time per night. She has regular bowel movements daily with the use of Metamucil. Occasionally she'll have some loose stools. She has shortness of breath when she bends over. She also has vertigo when she lies down. She can go up a flight of stairs. She refuses to have weight checked she does not like to see what her weight is. She does calorie counts and only  consumes 1200-1400 kcal per day and her weight has been fairly stable. She states that she has had a meningioma in her brain for 12-14 years and that she does not want surgery. She stopped having mammograms several years ago. She's never had a colonoscopy.   Constitutional:  Denies fever. Skin: No rash, sores, jaundice, itching, or dryness.  Cardiovascular: No chest pain, shortness of breath, or edema  Pulmonary: No cough or wheeze.  Gastro Intestinal:  No nausea, vomiting, constipation, or diarrhea reported. No bright red blood per rectum or change in bowel movement.  Genitourinary: No frequency, urgency, or dysuria.  Denies vaginal bleeding and discharge.  Musculoskeletal: No myalgia, arthralgia, joint swelling or pain.  Neurologic: No weakness, numbness, or change in gait.  Psychology: No issues  Current Meds:  Outpatient Encounter Prescriptions as of 03/02/2013  Medication Sig Dispense Refill  . ALPRAZolam (NIRAVAM) 0.5 MG dissolvable tablet Take 1 tablet at bedtime as needed for anxiety.  30 tablet  5  . AMBULATORY NON FORMULARY MEDICATION Macular Degeneration capsules  2 daily      . aspirin 81 MG tablet Take 81 mg by mouth 3 (three) times a week.      . hydrochlorothiazide (HYDRODIURIL) 25 MG tablet TAKE 1 TABLET ONCE DAILY.  30 tablet  5  . lisinopril (PRINIVIL,ZESTRIL) 20 MG tablet TAKE 1 TABLET ONCE DAILY.  30 tablet  5  . psyllium (METAMUCIL) 0.52 G capsule Take 0.52 g by mouth daily.      . sertraline (ZOLOFT) 100  MG tablet Take 1 tablet (100 mg total) by mouth daily.  90 tablet  1  . SYNTHROID 100 MCG tablet TAKE 1 TABLET ONCE DAILY.  30 tablet  10   No facility-administered encounter medications on file as of 03/02/2013.    Allergy:  Allergies  Allergen Reactions  . Clarithromycin     REACTION: causes swelling  . Neomycin     REACTION: causes itching and redness  . Penicillins     REACTION: causes swelling    Social Hx:   History   Social History  . Marital  Status: Widowed    Spouse Name: N/A    Number of Children: N/A  . Years of Education: N/A   Occupational History  . Not on file.   Social History Main Topics  . Smoking status: Never Smoker   . Smokeless tobacco: Not on file  . Alcohol Use: No  . Drug Use: Not on file  . Sexually Active: Not on file   Other Topics Concern  . Not on file   Social History Narrative   Recently widowed after 62 years   She just recently sold off many household goods and had a finality to her loss.   L[facility] in independent [facility] the [facility]   End Of Life DNR patient would want mechanical ventilation, dialysis artificial nutrition and hydration as long as she maintained cognitive function. She states she would not want major surgery.    Past Surgical Hx:  Past Surgical History  Procedure Laterality Date  . Mastoid surgery    . Cataract extraction      Past Medical Hx:  Past Medical History  Diagnosis Date  . Skin cancer   . Meningioma   . Hepatic cyst   . Hypertrophy of kidney   . Irritable bladder   . Hyperlipidemia   . Hypothyroid   . Hypertension     Daughter with history of breast cancer at age of 38 to Oncology Hx:   No history exists.    Family Hx: No family history on file.  Vitals:  Blood pressure 142/64, pulse 66, temperature 98.1 F (36.7 C), resp. rate 16, height 5\' 2"  (1.575 m), weight 0 lb (0 kg).  Physical Exam: Well-nourished well-developed female in no acute distress.  Neck: Supple, no lymphadenopathy, no thyromegaly.  Lungs: Clear to auscultation when he.  Cardiovascular: Regular rate rhythm.  Abdomen: Soft, nontender, nondistended. There is no palpable masses or prostatomegaly.  Extremity: No edema.  Pelvic: External genitalia within normal limits and atrophic vagina is markedly atrophic. The cervix is visualized. There's no gross visible lesions. Bimanual examination the uterus is midplane normal size shape and consistency. There is no adnexal  masses.  Assessment/Plan: 79 year old with a clinical stage I grade 1 endometrioid adenocarcinoma. Ultrasound is concerning for myometrial invasion. I spent 30 minutes face to face time with the patient and her daughter. We discussed options including surgery, radiation, and hormonal therapy. I discussed that these would be my preference of treatment in the order that they were discussed. With regards to surgery we discussed both minimally invasive surgery as well as laparotomy. If she wished to have a minimally invasive procedure I would need to speak with her neurosurgeon to ensure the Trendelenburg would not posterior problem with regards to her meningioma. She immediately became hesitant to does not want to have that type of surgery. We therefore discussed the option of proceeding with a total abdominal hysterectomy BSO via Pfannenstiel skin incision. We also  discussed radiation therapy and that this could be brachii therapy over 3-5 treatments and that she would need to be referred to radiation oncology. We also discussed hormonal therapy both oral progesterones as well as a progesterone releasing intrauterine device. They asked several questions which were answered to their satisfaction.  They were provided patient information regarding endometrial cancer in the treatment. He'll consider these options and will contact us with their decision.  Lakenzie Mcclafferty A., MD 03/02/2013, 12:52 PM

## 2013-03-07 ENCOUNTER — Encounter: Payer: Self-pay | Admitting: Radiation Oncology

## 2013-03-07 NOTE — Progress Notes (Addendum)
GYN Location of Tumor / Histology: endometrium  Patient presented 2 months ago with symptoms of: vaginal spotting  Biopsies of endometrium (if applicable) revealed:  Endometrium, biopsy - ENDOMETRIOID ADENOCARCINOMA. - SEE COMMENT. Microscopic Comment The adenocarcinoma appears FIGO Grade I.  Past/Anticipated interventions by Gyn/Onc surgery, if any: possibleTAH-BSO   Past/Anticipated interventions by medical oncology, if any: hormonal therapy  Weight changes, if any: none  Bowel/Bladder complaints, if any: urge incontinence, occasional loose stools  Nausea/Vomiting, if any: no  Pain issues, if any:  no  SAFETY ISSUES:  Prior radiation? no  Pacemaker/ICD? no  Possible current pregnancy? na  Is the patient on methotrexate? no  Current Complaints / other details:  Widowed, lives in independent living. Pt denies pain, fatigue, loss of appetite, vaginal discharge.

## 2013-03-08 ENCOUNTER — Ambulatory Visit
Admission: RE | Admit: 2013-03-08 | Discharge: 2013-03-08 | Disposition: A | Discharge status: Home / Self Care | Payer: Medicare Other | Source: Ambulatory Visit | Attending: Radiation Oncology | Admitting: Radiation Oncology

## 2013-03-08 ENCOUNTER — Telehealth: Payer: Self-pay | Admitting: Internal Medicine

## 2013-03-08 ENCOUNTER — Encounter: Payer: Self-pay | Admitting: Radiation Oncology

## 2013-03-08 VITALS — BP 149/84 | HR 88 | Temp 97.6°F | Resp 20 | Ht 62.0 in

## 2013-03-08 DIAGNOSIS — E039 Hypothyroidism, unspecified: Secondary | ICD-10-CM | POA: Insufficient documentation

## 2013-03-08 DIAGNOSIS — I10 Essential (primary) hypertension: Secondary | ICD-10-CM | POA: Insufficient documentation

## 2013-03-08 DIAGNOSIS — C549 Malignant neoplasm of corpus uteri, unspecified: Secondary | ICD-10-CM | POA: Insufficient documentation

## 2013-03-08 DIAGNOSIS — C541 Malignant neoplasm of endometrium: Secondary | ICD-10-CM

## 2013-03-08 DIAGNOSIS — H9191 Unspecified hearing loss, right ear: Secondary | ICD-10-CM | POA: Insufficient documentation

## 2013-03-08 DIAGNOSIS — Z79899 Other long term (current) drug therapy: Secondary | ICD-10-CM | POA: Insufficient documentation

## 2013-03-08 HISTORY — DX: Unspecified macular degeneration: H35.30

## 2013-03-08 HISTORY — DX: Malignant neoplasm of endometrium: C54.1

## 2013-03-08 HISTORY — DX: Unspecified hearing loss, right ear: H91.91

## 2013-03-08 NOTE — Progress Notes (Signed)
Please see the Nurse Progress Note in the MD Initial Consult Encounter for this patient. 

## 2013-03-08 NOTE — Telephone Encounter (Signed)
Hmmmm! OK she should keep the appointment she has scheduled and I will try not to waste anybody's time.

## 2013-03-08 NOTE — Telephone Encounter (Signed)
The patient's daughter called and scheduled the patient a follow up appointment.  She was very anxious on the phone and asked that I relay to you to (and I quote....) "review her entire chart before the appointment so her [the patient] time isn't wasted".  I told the daughter I would really this message.    If you want the patient re-scheduled, let me know, and i'll be happy to do that.   Thanks!

## 2013-03-08 NOTE — Progress Notes (Signed)
Radiation Oncology         [phone] ________________________________  Initial outpatient Consultation  Name: Jessica Haynes MRN: [id number]  Date: 03/08/2013  DOB: December 04, 1922  [id number] Norins, MD  Thompson Caul., MD   REFERRING PHYSICIAN: Thompson Caul., MD  DIAGNOSIS: The encounter diagnosis was Endometrial cancer.  HISTORY OF PRESENT ILLNESS::Jessica Haynes is a 79 y.o. female who is female who is seen out of the courtesy of Dr. Duard Brady for an opinion concerning radiation therapy as part of management of patient's recently diagnosed endometrial cancer. Patient presented earlier this year with vaginal spotting and no other symptoms.  She was seen by Dr. Macon Large and endometrial biopsy was performed which revealed endometrioid adenocarcinoma most likely FIGO grade 1. Patient proceeded to undergo a transabdominal and transvaginal ultrasound of the pelvis. This showed the uterus to be anteverted measurements of 5.7 cm length by 3.4 cm width and a depth of 3.6 cm.. Along the left posterior lateral portion of the endometrium there appeared to be muscle invasion  greater than 50% of the thickness. There was no obvious spread into the serosa on ultrasound. With these findings the patient was seen by Dr. Duard Brady and options for management including surgery,  radiation therapy and hormonal therapy were discussed with the patient. At this time the patient does not wish to proceed with surgery in light of her age. She is most interested in radiation therapy and wishes to explore more details concerning this treatment.    PREVIOUS RADIATION THERAPY: No  PAST MEDICAL HISTORY:  has a past medical history of Skin cancer; Meningioma; Hepatic cyst; Hypertrophy of kidney; Irritable bladder; Hyperlipidemia; Hypothyroid; Hypertension; Cancer (02/22/13); Endometrial cancer (02/2013); Deafness in right ear; and Macular degeneration.    PAST SURGICAL HISTORY: Past Surgical History  Procedure Laterality  Date  . Mastoid surgery  20 years ago  . Cataract extraction      bilateral    FAMILY HISTORY: family history includes Cancer in her sister; Cancer (age of onset: 28) in her daughter; and Stroke in her mother.  SOCIAL HISTORY:  reports that she has never smoked. She does not have any smokeless tobacco history on file. She reports that she does not drink alcohol or use illicit drugs.  ALLERGIES: Clarithromycin; Neomycin; and Penicillins  MEDICATIONS:  Current Outpatient Prescriptions  Medication Sig Dispense Refill  . ALPRAZolam (NIRAVAM) 0.5 MG dissolvable tablet Take 1 tablet at bedtime as needed for anxiety.  30 tablet  5  . AMBULATORY NON FORMULARY MEDICATION Macular Degeneration capsules  2 daily      . aspirin 81 MG tablet Take 81 mg by mouth 3 (three) times a week.      . hydrochlorothiazide (HYDRODIURIL) 25 MG tablet TAKE 1 TABLET ONCE DAILY.  30 tablet  5  . lisinopril (PRINIVIL,ZESTRIL) 20 MG tablet TAKE 1 TABLET ONCE DAILY.  30 tablet  5  . psyllium (METAMUCIL) 0.52 G capsule Take 0.52 g by mouth daily.      . sertraline (ZOLOFT) 100 MG tablet Take 1 tablet (100 mg total) by mouth daily.  90 tablet  1  . SYNTHROID 100 MCG tablet TAKE 1 TABLET ONCE DAILY.  30 tablet  10   No current facility-administered medications for this encounter.    REVIEW OF SYSTEMS:  A 15 point review of systems is documented in the electronic medical record. This was obtained by the nursing staff. However, I reviewed this with the patient to discuss relevant findings and make appropriate  changes. Other than some vaginal spotting the patient has been asymptomatic in terms of her endometrial cancer. She specifically denies any pain within the pelvis area,  urinary symptoms or bowel complaints. She denies any new bony pain. She occasionally has problems with dizziness   PHYSICAL EXAM:  height is 5\' 2"  (1.575 m). Her temperature is 97.6 F (36.4 C). Her blood pressure is 149/84 and her pulse is 88. Her  respiration is 20.  the patient did not wish to have an examination today.   LABORATORY DATA:  Lab Results  Component Value Date   WBC 7.1 01/27/2013   HGB 13.7 01/27/2013   HCT 40.4 01/27/2013   MCV 89.2 01/27/2013   PLT 219.0 01/27/2013   Lab Results  Component Value Date   NA 142 01/27/2013   K 4.5 01/27/2013   CL 107 01/27/2013   CO2 31 01/27/2013   Lab Results  Component Value Date   ALT 21 12/04/2009   AST 17 12/04/2009   ALKPHOS 38* 12/04/2009   BILITOT 0.8 12/04/2009     RADIOGRAPHY: US Transvaginal Non-ob  02/28/2013   *RADIOLOGY REPORT*  Clinical Data: Postmenopausal bleeding with three episodes of spotting in 2 months.  TRANSABDOMINAL AND TRANSVAGINAL ULTRASOUND OF PELVIS Technique:  Both transabdominal and transvaginal ultrasound examinations of the pelvis were performed. Transabdominal technique was performed for global imaging of the pelvis including uterus, ovaries, adnexal regions, and pelvic cul-de-sac.  It was necessary to proceed with endovaginal exam following the transabdominal exam to visualize the myometrium, endometrium and adnexa.  Comparison:  None  Findings:  Uterus: Is anteverted and retro flexed and demonstrates a sagittal length of 5.7 cm, width of 3.4 cm in depth of 3.6 cm.  Endometrium: An area of focal thickening of the endometrium is identified associated with the left posterolateral portion of the endometrium measuring 15.2 mm in depth with the myometrial endometrial interface in this location poorly delineated.  Given the history of endometrial biopsy positive for grade 1 adenocarcinoma, this appearance is suspicious for associated mural invasion with greater than 50% mural involvement suspected on cine loop evaluation but no definite spread beyond the serosa seen.  Right ovary:  Measures 2.4 x 1.2 x 1.7 cm and contains two thin- walled sub centimeter simple cysts.  Left ovary: Is not seen with confidence either transabdominally or endovaginally  Other findings: No pelvic  fluid or separate adnexal masses are seen.  IMPRESSION: Abnormal area of the endometrial thickening with findings suspicious for associated mural invasion measuring greater than 50% of the myometrial thickness with no definite extramural involvement seen.  Simple right ovarian cyst.  Given the patient's postmenopausal status, cyst appearance and size these are most compatible with benign cysts and can be followed annually.   Original Report Authenticated By: Rhodia Albright, M.D.   US Pelvis Complete  02/28/2013   *RADIOLOGY REPORT*  Clinical Data: Postmenopausal bleeding with three episodes of spotting in 2 months.  TRANSABDOMINAL AND TRANSVAGINAL ULTRASOUND OF PELVIS Technique:  Both transabdominal and transvaginal ultrasound examinations of the pelvis were performed. Transabdominal technique was performed for global imaging of the pelvis including uterus, ovaries, adnexal regions, and pelvic cul-de-sac.  It was necessary to proceed with endovaginal exam following the transabdominal exam to visualize the myometrium, endometrium and adnexa.  Comparison:  None  Findings:  Uterus: Is anteverted and retro flexed and demonstrates a sagittal length of 5.7 cm, width of 3.4 cm in depth of 3.6 cm.  Endometrium: An area of focal thickening of  the endometrium is identified associated with the left posterolateral portion of the endometrium measuring 15.2 mm in depth with the myometrial endometrial interface in this location poorly delineated.  Given the history of endometrial biopsy positive for grade 1 adenocarcinoma, this appearance is suspicious for associated mural invasion with greater than 50% mural involvement suspected on cine loop evaluation but no definite spread beyond the serosa seen.  Right ovary:  Measures 2.4 x 1.2 x 1.7 cm and contains two thin- walled sub centimeter simple cysts.  Left ovary: Is not seen with confidence either transabdominally or endovaginally  Other findings: No pelvic fluid or separate  adnexal masses are seen.  IMPRESSION: Abnormal area of the endometrial thickening with findings suspicious for associated mural invasion measuring greater than 50% of the myometrial thickness with no definite extramural involvement seen.  Simple right ovarian cyst.  Given the patient's postmenopausal status, cyst appearance and size these are most compatible with benign cysts and can be followed annually.   Original Report Authenticated By: Rhodia Albright, M.D.      IMPRESSION: Grade 1 endometrioid adenocarcinoma, staging incomplete.   I discussed options to consider for treatment. This included surgery including  minimally invasive surgery as well as laparotomy approach. Also discussed hormonal therapy with oral progesterones and possibly placement of a IUD. I also discussed radiation therapy options including external beam and intracavitary brachytherapy treatments. I discussed the ultrasound findings with probable deep myometrial invasion and I recommended  MRI of the pelvis to further evaluate this issue.  If the patient decides on radiation therapy for management and she does have  deep myometrial invasion,  I  would recommend a combination of external beam and brachytherapy.  PLAN: After hearing the details and time involvement with radiation therapy for definitive management, the patient is unsure whether she will proceed with any treatment at this time. She would like to speak with her primary care physician Dr. Debby Bud and her daughter prior to deciding on any potential treatments.     ------------------------------------------------  -----------------------------------  Billie Lade, PhD, MD

## 2013-03-16 ENCOUNTER — Encounter: Payer: Self-pay | Admitting: Internal Medicine

## 2013-03-16 ENCOUNTER — Ambulatory Visit (INDEPENDENT_AMBULATORY_CARE_PROVIDER_SITE_OTHER): Payer: Medicare Other | Admitting: Internal Medicine

## 2013-03-16 VITALS — BP 126/62 | HR 78 | Temp 98.0°F

## 2013-03-16 DIAGNOSIS — C549 Malignant neoplasm of corpus uteri, unspecified: Secondary | ICD-10-CM

## 2013-03-16 DIAGNOSIS — C541 Malignant neoplasm of endometrium: Secondary | ICD-10-CM

## 2013-03-16 NOTE — Patient Instructions (Addendum)
Good to see you.  You have FIGO stage 1 adenocarcinoma of the  ednometrium (uterus). This is a definite diagnosis and this cancer will grow. I don't know the natural history of this disease in terms of what happens and how you feel left untreated but it will be life-threatening.  The best treatment will be minimally invasive surgery. The meningioma should not be a complicating problem and since you would be under general anesthesia there is no problem with positional vertigo.  I understand your position today. As a compromise I suggest that you have a follow up scan in 3 months - anticipating growth and possibly spread of disease - and make a decision then: do nothing and accept this as your fate or have treatment with minimally invasive surgery still being the best choice although if there is a lot of growth this may be more difficult with an increase in potential surgical complications.  Call me anytime if you want to talk more or change your decision.

## 2013-03-19 NOTE — Progress Notes (Signed)
  Subjective:    Patient ID: Franki Cabot, female    DOB: [birth year], 79 y.o.   MRN: [id number]  HPI Mrs Tschida presents to discuss treatment options for FIGO stage 1 endometrial cancer. Reviewed notes from Gyn and radiation oncology with her. She states she feels well, admits to a limited life-expectancy and is reluctant to undertake any treatment.  PMH, FamHx and SocHx reviewed for any changes and relevance. Current Outpatient Prescriptions on File Prior to Visit  Medication Sig Dispense Refill  . ALPRAZolam (NIRAVAM) 0.5 MG dissolvable tablet Take 1 tablet at bedtime as needed for anxiety.  30 tablet  5  . AMBULATORY NON FORMULARY MEDICATION Macular Degeneration capsules  2 daily      . aspirin 81 MG tablet Take 81 mg by mouth 3 (three) times a week.      . hydrochlorothiazide (HYDRODIURIL) 25 MG tablet TAKE 1 TABLET ONCE DAILY.  30 tablet  5  . lisinopril (PRINIVIL,ZESTRIL) 20 MG tablet TAKE 1 TABLET ONCE DAILY.  30 tablet  5  . sertraline (ZOLOFT) 100 MG tablet Take 1 tablet (100 mg total) by mouth daily.  90 tablet  1  . SYNTHROID 100 MCG tablet TAKE 1 TABLET ONCE DAILY.  30 tablet  10   No current facility-administered medications on file prior to visit.      Review of Systems System review is negative for any constitutional, cardiac, pulmonary, GI or neuro symptoms or complaints other than as described in the HPI.     Objective:   Physical Exam Filed Vitals:   03/16/13 1108  BP: 126/62  Pulse: 78  Temp: 98 F (36.7 C)   Gen'l - WNWD woman who is rational, calm and clear about her wishes.       Assessment & Plan:

## 2013-03-19 NOTE — Assessment & Plan Note (Signed)
FIGO stage 1 endometrial cancer. All office/consult notes reviewed; images reviewed. Discussed treatment options as previously presented. She makes the point that she feels well, is very old and is reluctant to undergo any treatment. Spoke with her about surgical treatment carrying the least morbidity and that she was a reasonably good candidate for minimally invasive surgery. She prefers to wait and have follow-up imaging in 6 months. After much discussion about the risks of spread of disease which would complicate minimally invasive surgery she agreed to follow up in 3 months and then make a decision.   Offered her support in her decision as being sound and rational.  Plan Follow up CT pelvis in 3 month - order entered  (greater than 50% of30 min  visit spent on education and counseling)

## 2013-03-21 ENCOUNTER — Other Ambulatory Visit: Payer: Self-pay | Admitting: Internal Medicine

## 2013-03-21 DIAGNOSIS — C541 Malignant neoplasm of endometrium: Secondary | ICD-10-CM

## 2013-03-22 ENCOUNTER — Other Ambulatory Visit: Payer: Medicare Other

## 2013-04-11 ENCOUNTER — Other Ambulatory Visit: Payer: Self-pay

## 2013-04-11 MED ORDER — ALPRAZOLAM 0.5 MG PO TBDP: 0.5000 mg | ORAL_TABLET | Freq: Every evening | ORAL | Status: DC | PRN

## 2013-04-12 NOTE — Telephone Encounter (Signed)
Alprazolam called to pharmacy  

## 2013-05-05 ENCOUNTER — Other Ambulatory Visit: Payer: Self-pay | Admitting: Internal Medicine

## 2013-05-08 ENCOUNTER — Other Ambulatory Visit: Payer: Self-pay

## 2013-05-08 MED ORDER — ALPRAZOLAM 0.5 MG PO TBDP: 0.5000 mg | ORAL_TABLET | Freq: Every evening | ORAL | Status: DC | PRN

## 2013-05-08 NOTE — Telephone Encounter (Signed)
Notified pharmacy spoke with Asher Muir gave md  Approval...lmb

## 2013-06-07 ENCOUNTER — Other Ambulatory Visit: Payer: Self-pay | Admitting: Internal Medicine

## 2013-06-12 ENCOUNTER — Ambulatory Visit (INDEPENDENT_AMBULATORY_CARE_PROVIDER_SITE_OTHER): Payer: Medicare Other | Admitting: Internal Medicine

## 2013-06-12 ENCOUNTER — Encounter: Payer: Self-pay | Admitting: Internal Medicine

## 2013-06-12 VITALS — BP 144/60 | HR 84 | Temp 97.1°F

## 2013-06-12 DIAGNOSIS — C549 Malignant neoplasm of corpus uteri, unspecified: Secondary | ICD-10-CM

## 2013-06-12 DIAGNOSIS — C541 Malignant neoplasm of endometrium: Secondary | ICD-10-CM

## 2013-06-12 DIAGNOSIS — Z23 Encounter for immunization: Secondary | ICD-10-CM

## 2013-06-12 NOTE — Patient Instructions (Signed)
Good to see you.  You have made a very reasonable decision in regard to treatment for the endometrial cancer. It is also reasonable to take the progesterone product to slow the progression - few side effects. Will need to research the dosing and get back to you.  Immunization Flu shot and tetanus shot today. Please return in 2 weeks for Prevnar 13 pneumonia vaccine. Return 8 weeks later for the pneumovax 23 valent polysaccharide vaccine.   Have a nice lunch.

## 2013-06-12 NOTE — Progress Notes (Signed)
Patient refused weight today 

## 2013-06-12 NOTE — Progress Notes (Signed)
Subjective:    Patient ID: Jessica Haynes, female    DOB: Apr 19, 1923, 79 y.o.   MRN: [id number]  HPI Needs flu shot along with other immunizations.  She has made a firm decision to not pursue surgical treatment or radiation therapy for FIGO I endometrial cancer but she is willing to take progesterone to slow down the cancer. Her daughter ,representing the family, is present and Jessica Haynes has discussed this decision with them and they are supportive.  Discussed amxiety - she is taking zoloft and prn Xanax. Explained that this is not a a sleep medicine but is a good prn medication for breakthrough anxiety.  Past Medical History  Diagnosis Date  . Skin cancer   . Meningioma   . Hepatic cyst   . Hypertrophy of kidney   . Irritable bladder   . Hyperlipidemia   . Hypothyroid   . Hypertension   . Cancer 02/22/13    endometrial  . Endometrial cancer 02/2013  . Deafness in right ear   . Macular degeneration    Past Surgical History  Procedure Laterality Date  . Mastoid surgery  20 years ago  . Cataract extraction      bilateral   Family History  Problem Relation Age of Onset  . Cancer Daughter 4    breast  . Stroke Mother   . Cancer Sister     1 sister w/breast   History   Social History  . Marital Status: Widowed    Spouse Name: N/A    Number of Children: N/A  . Years of Education: N/A   Occupational History  . Not on file.   Social History Main Topics  . Smoking status: Never Smoker   . Smokeless tobacco: Not on file  . Alcohol Use: No  . Drug Use: No  . Sexual Activity: Not on file     Comment: G1 P1, menopause in 50's   Other Topics Concern  . Not on file   Social History Narrative   Recently widowed after 62 years   She just recently sold off many household goods and had a finality to her loss.   [facility]   End Of Life DNR patient would want mechanical ventilation, dialysis artificial nutrition and hydration as long  as she maintained cognitive function. She states she would not want major surgery.    Current Outpatient Prescriptions on File Prior to Visit  Medication Sig Dispense Refill  . ALPRAZolam (NIRAVAM) 0.5 MG dissolvable tablet Take 1 tablet (0.5 mg total) by mouth at bedtime as needed for anxiety.  30 tablet  0  . AMBULATORY NON FORMULARY MEDICATION Macular Degeneration capsules  2 daily      . aspirin 81 MG tablet Take 81 mg by mouth 3 (three) times a week.      . hydrochlorothiazide (HYDRODIURIL) 25 MG tablet TAKE 1 TABLET ONCE DAILY.  30 tablet  5  . levothyroxine (SYNTHROID, LEVOTHROID) 100 MCG tablet TAKE 1 TABLET ONCE DAILY.  30 tablet  5  . lisinopril (PRINIVIL,ZESTRIL) 20 MG tablet TAKE 1 TABLET ONCE DAILY.  30 tablet  5  . sertraline (ZOLOFT) 100 MG tablet TAKE 1 TABLET ONCE DAILY.  90 tablet  3   No current facility-administered medications on file prior to visit.      Review of Systems Constitutional:  Negative for fever, chills, activity change and unexpected weight change.  HEENT:  Negative for hearing loss, ear pain, congestion, neck stiffness  and postnasal drip. Negative for sore throat or swallowing problems. Negative for dental complaints.   Eyes: Negative for vision loss or change in visual acuity.  Respiratory: Negative for chest tightness and wheezing. Negative for DOE.   Cardiovascular: Negative for chest pain or palpitations. No decreased exercise tolerance Gastrointestinal: No change in bowel habit. No bloating or gas. No reflux or indigestion Genitourinary: Negative for urgency, frequency, flank pain and difficulty urinating.  Musculoskeletal: Negative for myalgias, back pain, arthralgias and gait problem.  Neurological: Negative for dizziness, tremors, weakness and headaches.  Hematological: Negative for adenopathy.  Psychiatric/Behavioral: Negative for behavioral problems and dysphoric mood.       Objective:   Physical Exam Filed Vitals:   06/12/13 1045   BP: 144/60  Pulse: 84  Temp: 97.1 F (36.2 C)   Gen'l- WNWD woman looking younger than her years HEENT- lesion on the lower eyelid right eye, C&S clear, PERRLA Cor - RRR Pulm - normal respirations Neuro - alert and oriented, normal "get up and go," normal gait.       Assessment & Plan:

## 2013-06-13 ENCOUNTER — Telehealth: Payer: Self-pay

## 2013-06-13 MED ORDER — PROGESTERONE MICRONIZED 100 MG PO CAPS: 100.0000 mg | ORAL_CAPSULE | Freq: Every day | ORAL | Status: DC

## 2013-06-13 NOTE — Telephone Encounter (Signed)
Patient has been notified

## 2013-06-13 NOTE — Assessment & Plan Note (Signed)
Jessica Haynes has decided to not have surgery or radiation therapy. She is willing to take progesterone to slow disease. Verified efficacy via PubMed search.  Plan progesterone 100 mg qHS

## 2013-06-13 NOTE — Telephone Encounter (Signed)
Message copied by Noreene Larsson on Tue Jun 13, 2013  8:15 AM ------      Message from: Jacques Navy      Created: Tue Jun 13, 2013  5:16 AM       Call patient please - researched use of progesterone for slowing endometrial cancer - good literature to support use. Rx for micronized progesterone capsule 100 mg sent to pharmacy.            Thanks ------

## 2013-06-14 ENCOUNTER — Other Ambulatory Visit: Payer: Self-pay | Admitting: Internal Medicine

## 2013-06-14 NOTE — Progress Notes (Signed)
Opened in error

## 2013-06-24 HISTORY — PX: OTHER SURGICAL HISTORY: SHX169

## 2013-06-28 ENCOUNTER — Ambulatory Visit (INDEPENDENT_AMBULATORY_CARE_PROVIDER_SITE_OTHER): Payer: Medicare Other

## 2013-06-28 DIAGNOSIS — Z23 Encounter for immunization: Secondary | ICD-10-CM

## 2013-06-28 NOTE — Progress Notes (Signed)
Here for Prevnar injection. Given in R deltoid tolerated well by patient.

## 2013-07-03 ENCOUNTER — Encounter: Payer: Self-pay | Admitting: Internal Medicine

## 2013-07-03 ENCOUNTER — Ambulatory Visit (INDEPENDENT_AMBULATORY_CARE_PROVIDER_SITE_OTHER): Payer: Medicare Other | Admitting: Internal Medicine

## 2013-07-03 ENCOUNTER — Other Ambulatory Visit: Payer: Medicare Other

## 2013-07-03 VITALS — BP 122/60 | HR 79 | Temp 97.7°F

## 2013-07-03 DIAGNOSIS — D485 Neoplasm of uncertain behavior of skin: Secondary | ICD-10-CM

## 2013-07-03 NOTE — Progress Notes (Signed)
  Subjective:    Patient ID: Jessica Haynes, female    DOB: 22-Feb-1923, 79 y.o.   MRN: [id number]  HPI Jessica Haynes presents for evaluation of new, changing skin lesion on the right face in the infraorbital region. She has a h/o basal carcinoma on the nose.   PMH, FamHx and SocHx reviewed for any changes and relevance.  Current Outpatient Prescriptions on File Prior to Visit  Medication Sig Dispense Refill  . AMBULATORY NON FORMULARY MEDICATION Macular Degeneration capsules  2 daily      . aspirin 81 MG tablet Take 81 mg by mouth 3 (three) times a week.      . hydrochlorothiazide (HYDRODIURIL) 25 MG tablet TAKE 1 TABLET ONCE DAILY.  30 tablet  5  . levothyroxine (SYNTHROID, LEVOTHROID) 100 MCG tablet TAKE 1 TABLET ONCE DAILY.  30 tablet  5  . lisinopril (PRINIVIL,ZESTRIL) 20 MG tablet TAKE 1 TABLET ONCE DAILY.  30 tablet  5  . progesterone (PROMETRIUM) 100 MG capsule Take 1 capsule (100 mg total) by mouth daily.  30 capsule  5  . sertraline (ZOLOFT) 100 MG tablet TAKE 1 TABLET ONCE DAILY.  90 tablet  3   No current facility-administered medications on file prior to visit.      Review of Systems System review is negative for any constitutional, cardiac, pulmonary, GI or neuro symptoms or complaints other than as described in the HPI.     Objective:   Physical Exam Filed Vitals:   07/03/13 1141  BP: 122/60  Pulse: 79  Temp: 97.7 F (36.5 C)   Cor- RRR Pulm - normal respirations Derm- tan macular-papular lesions right infra-orbital region without excoriation, varigation , irregular borders.       Assessment & Plan:  Derm - benign appearing lesions on the face.  Plan Referral to Derm- Dr. Terri Piedra

## 2013-07-03 NOTE — Progress Notes (Signed)
Patient refused weight today 

## 2013-07-03 NOTE — Progress Notes (Signed)
Pre visit review using our clinic review tool, if applicable. No additional management support is needed unless otherwise documented below in the visit note. 

## 2013-08-23 ENCOUNTER — Ambulatory Visit (INDEPENDENT_AMBULATORY_CARE_PROVIDER_SITE_OTHER): Payer: Medicare Other

## 2013-08-23 DIAGNOSIS — Z23 Encounter for immunization: Secondary | ICD-10-CM

## 2013-09-07 ENCOUNTER — Ambulatory Visit (INDEPENDENT_AMBULATORY_CARE_PROVIDER_SITE_OTHER): Payer: Medicare Other | Admitting: Internal Medicine

## 2013-09-07 ENCOUNTER — Encounter: Payer: Self-pay | Admitting: Internal Medicine

## 2013-09-07 VITALS — BP 120/62 | HR 80 | Temp 96.7°F

## 2013-09-07 DIAGNOSIS — Z0000 Encounter for general adult medical examination without abnormal findings: Secondary | ICD-10-CM

## 2013-09-07 DIAGNOSIS — R0989 Other specified symptoms and signs involving the circulatory and respiratory systems: Secondary | ICD-10-CM

## 2013-09-07 DIAGNOSIS — G510 Bell's palsy: Secondary | ICD-10-CM

## 2013-09-07 DIAGNOSIS — C549 Malignant neoplasm of corpus uteri, unspecified: Secondary | ICD-10-CM

## 2013-09-07 DIAGNOSIS — H9190 Unspecified hearing loss, unspecified ear: Secondary | ICD-10-CM

## 2013-09-07 DIAGNOSIS — C541 Malignant neoplasm of endometrium: Secondary | ICD-10-CM

## 2013-09-07 DIAGNOSIS — D320 Benign neoplasm of cerebral meninges: Secondary | ICD-10-CM

## 2013-09-07 DIAGNOSIS — I10 Essential (primary) hypertension: Secondary | ICD-10-CM

## 2013-09-07 DIAGNOSIS — H9191 Unspecified hearing loss, right ear: Secondary | ICD-10-CM

## 2013-09-07 DIAGNOSIS — R42 Dizziness and giddiness: Secondary | ICD-10-CM

## 2013-09-07 DIAGNOSIS — H3530 Unspecified macular degeneration: Secondary | ICD-10-CM

## 2013-09-07 DIAGNOSIS — R0609 Other forms of dyspnea: Secondary | ICD-10-CM

## 2013-09-07 NOTE — Assessment & Plan Note (Signed)
Chronic problem not tested today. At rest O2 saturation is normal. No 2 D echo on file. No PFTs on file.

## 2013-09-07 NOTE — Assessment & Plan Note (Signed)
MRI '11: IMPRESSION:  1. No acute infarct.  2. Chronic right middle cranial fossa extra-axial mass  infiltrating the skull base and infratemporal soft tissues appears  not significantly changed in size since 10/29/2007. Given the  close proximity to the right internal auditory structures, this  might involve the right seventh nerve, uncertain. Temporal bone CT  would be best for evaluation of bone destruction at the skull base,  where as IAC protocol MRI (IV contrast mandatory) contrast would  best evaluate the right seventh nerve.  No further evaluation with a slowly growing tumor. 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 This also is affecting her balance.

## 2013-09-07 NOTE — Assessment & Plan Note (Signed)
Progressive macular degeneration with progessive loss of vision but still able to read with a magnifying lens.

## 2013-09-07 NOTE — Assessment & Plan Note (Signed)
FIGO Grade 1 endometrioid adenocarcinoma diagnosed on endometrial biopsy. Has deferred treatment knowing she has a potentially terminal disease. She has not had excessive symptoms.

## 2013-09-07 NOTE — Patient Instructions (Signed)
Glad to see you. Overall, given the things you are dealing with you are in pretty good condition for the condition that you are in.

## 2013-09-07 NOTE — Assessment & Plan Note (Signed)
Pretty much resolved. 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

## 2013-09-07 NOTE — Progress Notes (Signed)
Patient refused weight today

## 2013-09-07 NOTE — Assessment & Plan Note (Signed)
Chronic dizziness/dysequilbrium that is stable.

## 2013-09-07 NOTE — Assessment & Plan Note (Signed)
BP Readings from Last 3 Encounters:  09/07/13 120/62  07/03/13 122/60  06/12/13 144/60   Good control on present medications.

## 2013-09-07 NOTE — Progress Notes (Signed)
Pre visit review using our clinic review tool, if applicable. No additional management support is needed unless otherwise documented below in the visit note. 

## 2013-09-07 NOTE — Assessment & Plan Note (Signed)
Chronic. Based on MRI 2011 no encroachment of tumor on auditory apparatus.

## 2013-09-07 NOTE — Progress Notes (Signed)
Subjective:    Patient ID: Jessica Haynes, female    DOB: [birth year], 80 y.o.   MRN: [id number]  HPI Jessica Haynes presents for a mobility and independence exam. Her multiplicity of problems she is limited in her ability to ambulate - cannot ambulate w/o cane or personal assistance within her home and is limited to 200 yds or less using Cane. She can manage ADLs, including bathing, but is at high risk for falls and would benefit from having a "stand-by." She is able to feed herself but cannot prepare meals. She does have unanticipated falls, she does have incontinence of bowel and bladder on an intermittent (accident) basis.   Today she is feeling well and has not had a recent fall or injury. She is accompanied by her son-in-law.   Past Medical History  Diagnosis Date  . Skin cancer   . Meningioma   . Hepatic cyst   . Hypertrophy of kidney   . Irritable bladder   . Hyperlipidemia   . Hypothyroid   . Hypertension   . Cancer 02/22/13    endometrial  . Endometrial cancer 02/2013  . Deafness in right ear   . Macular degeneration    Past Surgical History  Procedure Laterality Date  . Mastoid surgery  20 years ago  . Cataract extraction      bilateral   Family History  Problem Relation Age of Onset  . Cancer Daughter 48    breast  . Stroke Mother   . Cancer Sister     1 sister w/breast   History   Social History  . Marital Status: Widowed    Spouse Name: N/A    Number of Children: N/A  . Years of Education: N/A   Occupational History  . Not on file.   Social History Main Topics  . Smoking status: Never Smoker   . Smokeless tobacco: Not on file  . Alcohol Use: No  . Drug Use: No  . Sexual Activity: Not on file     Comment: G1 P1, menopause in 50's   Other Topics Concern  . Not on file   Social History Narrative   Recently widowed after 48 years   She just recently sold off many household goods and had a finality to her loss.   Lives in independent living at  the [facility] DNR patient would want mechanical ventilation, dialysis artificial nutrition and hydration as long as she maintained cognitive function. She states she would not want major surgery.    Current Outpatient Prescriptions on File Prior to Visit  Medication Sig Dispense Refill  . AMBULATORY NON FORMULARY MEDICATION Macular Degeneration capsules  2 daily      . aspirin 81 MG tablet Take 81 mg by mouth 3 (three) times a week.      . hydrochlorothiazide (HYDRODIURIL) 25 MG tablet TAKE 1 TABLET ONCE DAILY.  30 tablet  5  . levothyroxine (SYNTHROID, LEVOTHROID) 100 MCG tablet TAKE 1 TABLET ONCE DAILY.  30 tablet  5  . lisinopril (PRINIVIL,ZESTRIL) 20 MG tablet TAKE 1 TABLET ONCE DAILY.  30 tablet  5  . progesterone (PROMETRIUM) 100 MG capsule Take 1 capsule (100 mg total) by mouth daily.  30 capsule  5  . sertraline (ZOLOFT) 100 MG tablet TAKE 1 TABLET ONCE DAILY.  90 tablet  3   No current facility-administered medications on file prior to visit.      Review of Systems Constitutional:  Negative for  fever, chills, activity change and unexpected weight change.  HEENT:  Positive for hearing loss right ear.no ear pain, congestion, neck stiffness and postnasal drip. Negative for sore throat or swallowing problems. Negative for dental complaints.   Eyes: Positive for vision loss or change in visual acuity.  Respiratory: Negative for chest tightness and wheezing. Positive for DOE.   Cardiovascular: Negative for chest pain or palpitations. No decreased exercise tolerance Gastrointestinal: No change in bowel habit. No bloating or gas. No reflux or indigestion Genitourinary: Positive for urgency, frequency, and occasional overflow incontinence and chronic leakage.  Musculoskeletal: Positive for myalgias, arthralgias and gait problem.  Neurological: Positive for dizziness, no tremors, positive for generalized weakness, no  headaches.  Hematological: Negative for adenopathy.    Psychiatric/Behavioral: Negative for behavioral problems and dysphoric mood.       Objective:   Physical Exam Filed Vitals:   09/07/13 0940  BP: 120/62  Pulse: 80  Temp: 96.7 F (35.9 C)   BP Readings from Last 3 Encounters:  09/07/13 120/62  07/03/13 122/60  06/12/13 144/60   Gen'l - elderly woman in no distress HEENT - Chronic stye medial lower lid right Neck - supple Cor - 2+ radial pulse, RRR, no JVD Pulm - normal respirations, no rales or wheezes Neuro - A&O x 3, CN II- XII - mild facial droop (CN VII impairment), PERRLA, EOMI; MS 4/5 throughout - able to stand w/o assistance, no pronator drift, negative rhomberg; Cerebellar - no tremor or cogwheeling, unsteady gait requires 1+ Assistance, cannot tandem gait.  MSK - no joint deformity, no inflammation.        Assessment & Plan:

## 2013-09-11 ENCOUNTER — Other Ambulatory Visit: Payer: Self-pay

## 2013-09-11 NOTE — Telephone Encounter (Signed)
Received a fax from [facility] that progesterone now needs a PA. Or insurance does cover generic Provera. Per Dr Linda Hedges faxed back to pharmacy okay to changes to Provera , same dose/equivalent pharmacy fax [phone]

## 2013-09-12 ENCOUNTER — Other Ambulatory Visit: Payer: Self-pay

## 2013-09-12 MED ORDER — MEDROXYPROGESTERONE ACETATE 10 MG PO TABS: 10.0000 mg | ORAL_TABLET | Freq: Every day | ORAL | Status: DC

## 2013-10-30 ENCOUNTER — Other Ambulatory Visit: Payer: Self-pay | Admitting: Internal Medicine

## 2013-11-16 ENCOUNTER — Telehealth: Payer: Self-pay | Admitting: Internal Medicine

## 2013-11-16 NOTE — Telephone Encounter (Signed)
She has 3-4 movements per day.  Muddy water with little to no solid material. No abdominal pain.  She has not been on antibiotics.

## 2013-11-16 NOTE — Telephone Encounter (Signed)
Need more info: how many stools per day; blood or mucus in the stool; abdominal pain; any recent antibiotics.

## 2013-11-16 NOTE — Telephone Encounter (Signed)
Pt has had diarrhea for 3 wks.  She has been taking imodium.  No stomach pain.  Liquid diarrhea.  What else can she do.

## 2013-11-16 NOTE — Telephone Encounter (Signed)
Called patient and confirmed: no fever, no pain, no blood in the stool, no mucus. 3-4 watery stools per day for 3 weeks.  Plan Stop immodium  Increase Metamucil to twice a day  Fiber in the diet.  For persistent problems call back ..call Dr. Maudie Mercury at [place].

## 2014-01-03 ENCOUNTER — Other Ambulatory Visit (INDEPENDENT_AMBULATORY_CARE_PROVIDER_SITE_OTHER): Payer: Medicare Other

## 2014-01-03 ENCOUNTER — Encounter: Payer: Self-pay | Admitting: Internal Medicine

## 2014-01-03 ENCOUNTER — Ambulatory Visit (INDEPENDENT_AMBULATORY_CARE_PROVIDER_SITE_OTHER): Payer: Medicare Other | Admitting: Internal Medicine

## 2014-01-03 VITALS — BP 130/60 | HR 99 | Temp 98.0°F

## 2014-01-03 DIAGNOSIS — C801 Malignant (primary) neoplasm, unspecified: Secondary | ICD-10-CM

## 2014-01-03 DIAGNOSIS — R197 Diarrhea, unspecified: Secondary | ICD-10-CM

## 2014-01-03 LAB — CBC WITH DIFFERENTIAL/PLATELET
BASOS ABS: 0 10*3/uL (ref 0.0–0.1)
BASOS PCT: 0.3 % (ref 0.0–3.0)
EOS ABS: 0.2 10*3/uL (ref 0.0–0.7)
Eosinophils Relative: 2.1 % (ref 0.0–5.0)
HCT: 35.7 % — ABNORMAL LOW (ref 36.0–46.0)
Hemoglobin: 12.3 g/dL (ref 12.0–15.0)
LYMPHS PCT: 22.1 % (ref 12.0–46.0)
Lymphs Abs: 1.8 10*3/uL (ref 0.7–4.0)
MCHC: 34.6 g/dL (ref 30.0–36.0)
MCV: 89.4 fl (ref 78.0–100.0)
Monocytes Absolute: 0.5 10*3/uL (ref 0.1–1.0)
Monocytes Relative: 6 % (ref 3.0–12.0)
NEUTROS PCT: 69.5 % (ref 43.0–77.0)
Neutro Abs: 5.8 10*3/uL (ref 1.4–7.7)
PLATELETS: 221 10*3/uL (ref 150.0–400.0)
RBC: 3.99 Mil/uL (ref 3.87–5.11)
RDW: 13.7 % (ref 11.5–15.5)
WBC: 8.3 10*3/uL (ref 4.0–10.5)

## 2014-01-03 LAB — BASIC METABOLIC PANEL
BUN: 30 mg/dL — ABNORMAL HIGH (ref 6–23)
CALCIUM: 9.7 mg/dL (ref 8.4–10.5)
CO2: 22 mEq/L (ref 19–32)
Chloride: 110 mEq/L (ref 96–112)
Creatinine, Ser: 1.7 mg/dL — ABNORMAL HIGH (ref 0.4–1.2)
GFR: 29.89 mL/min — AB (ref >60.00)
Glucose, Bld: 120 mg/dL — ABNORMAL HIGH (ref 70–99)
POTASSIUM: 4.1 meq/L (ref 3.5–5.1)
SODIUM: 140 meq/L (ref 135–145)

## 2014-01-03 LAB — TSH: TSH: 3.81 u[IU]/mL (ref 0.35–4.50)

## 2014-01-03 NOTE — Progress Notes (Signed)
Patient refused to be weighed.

## 2014-01-03 NOTE — Progress Notes (Signed)
Pre visit review using our clinic review tool, if applicable. No additional management support is needed unless otherwise documented below in the visit note. 

## 2014-01-03 NOTE — Progress Notes (Signed)
   Subjective:    Patient ID: Jessica Haynes, female    DOB: 1922-11-08, 80 y.o.   MRN: [id number]  HPI   She has had symptoms for at least 3 months in the context of absence of  definite trigger except for starting a "cancer pill". This is Provera 10 mg. She denies any significant travel; sick contacts; sick pets; suspicious food or liquid exposure/ingestion.  She has not taken any oral antibodies the last 3 months  She's treated the diarrhea with Imodium and dietary change without significant improvement in the symptoms. The course remains essentially stable without significant variation     She has had some "hard nuggets" in the context of diffuse watery brown liquid occurring 4-5 times a day.  Significant history includes a diagnosis of uterine cancer November 2014. She declined surgery. Dr Linda Hedges started Provera.  She denies a past history of colitis, diverticulitis or ulcer disease     Review of Systems She has  some vertiginous symptoms in the supine position  She's had some left ankle edema with less swelling on the right.  She specifically denies nausea, vomiting, dyspepsia, abdominal pain, hematemesis, melena, or rectal bleeding  There is no associated fever, chills, sweats, or weight loss  There's been no decrease in urine volume or lightheadedness.  She has no dysuria, pyuria, or hematuria.     Objective:   Physical Exam General appearance adequate  nourishment w/o distress. Slight facial asymmetry  Eyes: No conjunctival inflammation or scleral icterus is present.OS ptosis > OD  Oral exam: Dental hygiene is good; lips and gums are healthy appearing.There is no oropharyngeal erythema or exudate noted.   Heart:  Normal rate and regular rhythm. S1 and S2 normal without gallop, murmur, click, rub . S4 with slurring Lungs:Chest clear to auscultation; no wheezes, rhonchi,rales ,or rubs present.No increased work of breathing.   Abdomen: bowel sounds normal,  soft and non-tender without masses, organomegaly or hernias noted.  No guarding or rebound . No tenderness over the flanks to percussion  Musculoskeletal: Able to lie flat and sit up without help. Negative straight leg raising bilaterally. Gait normal Thoracic lordosis  Skin:Warm & dry.  Intact without suspicious lesions or rashes ; no jaundice . Slight  Tenting  Decreased pedal pulses   Lymphatic: No lymphadenopathy is noted about the head, neck, axilla areas.                  Assessment & Plan:  #1 diarrhea , chronic #2 uterine cancer, untreated See orders

## 2014-01-03 NOTE — Patient Instructions (Signed)
Stop Metamucil & take a Probiotic , Florastor OR Align, every day until the bowels are normal. This will replace the normal bacteria which  are necessary for formation of normal stool and processing of food. Your next office appointment will be determined based upon review of your pending labs . Those instructions will be transmitted to you  by mail.

## 2014-01-04 ENCOUNTER — Ambulatory Visit: Payer: Medicare Other | Admitting: Internal Medicine

## 2014-01-04 ENCOUNTER — Telehealth: Payer: Self-pay | Admitting: Internal Medicine

## 2014-01-04 NOTE — Telephone Encounter (Signed)
Please call to clearify medicaiton direction for probiotic that was given to her on 01/03/14.

## 2014-01-05 NOTE — Telephone Encounter (Signed)
Please help due to Andreasa's absence.

## 2014-01-11 ENCOUNTER — Telehealth: Payer: Self-pay | Admitting: Internal Medicine

## 2014-01-11 NOTE — Telephone Encounter (Signed)
PT SON CAME IN 01/11/2014 TO REQUEST LAB RESULTS AND ADDRESS MEDICATION CONCERNS. HE STATES THAT THE PT WAS SUPPOSED TO RECEIVE LAB RESULTS IN THE MAIL FROM PREVIOUS VISIT AND HAS NOT RECEIVED THEM. JUST WANTS TO CHECK ON THE STATUS OF RESULTS. HE ALSO STATES THAT PT IS NOT FOLLOWING PROVIDED INSTRUCTIONS FOR THE NEW MEDICATION PRESCRIBED AT LAST VISIT. SON WOULD LIKE TO BE CONTACTED AT [phone].

## 2014-01-11 NOTE — Telephone Encounter (Signed)
I spoke to [place] and let him know the print out of patient's labs is at the front desk. He states he will be back to pick them up.

## 2014-01-27 ENCOUNTER — Ambulatory Visit: Payer: Medicare Other

## 2014-01-27 ENCOUNTER — Ambulatory Visit (INDEPENDENT_AMBULATORY_CARE_PROVIDER_SITE_OTHER): Payer: Medicare Other | Admitting: Family Medicine

## 2014-01-27 VITALS — BP 130/62 | HR 104 | Temp 97.8°F | Resp 16 | Ht 64.0 in

## 2014-01-27 DIAGNOSIS — M25522 Pain in left elbow: Secondary | ICD-10-CM

## 2014-01-27 DIAGNOSIS — S59919A Unspecified injury of unspecified forearm, initial encounter: Secondary | ICD-10-CM

## 2014-01-27 DIAGNOSIS — S6990XA Unspecified injury of unspecified wrist, hand and finger(s), initial encounter: Secondary | ICD-10-CM

## 2014-01-27 DIAGNOSIS — S59909A Unspecified injury of unspecified elbow, initial encounter: Secondary | ICD-10-CM

## 2014-01-27 DIAGNOSIS — S59902A Unspecified injury of left elbow, initial encounter: Secondary | ICD-10-CM

## 2014-01-27 DIAGNOSIS — M25529 Pain in unspecified elbow: Secondary | ICD-10-CM

## 2014-01-27 DIAGNOSIS — S51019A Laceration without foreign body of unspecified elbow, initial encounter: Secondary | ICD-10-CM

## 2014-01-27 MED ORDER — MUPIROCIN 2 % EX OINT: 1.0000 "application " | TOPICAL_OINTMENT | Freq: Two times a day (BID) | CUTANEOUS | Status: DC

## 2014-01-27 NOTE — Patient Instructions (Addendum)
Clean wounds with soap and water once to twice per day, bactroban ointment, and new bandage.  Return to the clinic or go to the nearest emergency room if any of your symptoms worsen or new symptoms occur.   Skin Tear Care A skin tear is a wound in which the top layer of skin has peeled off. This is a common problem with aging because the skin becomes thinner and more fragile as a person gets older. In addition, some medicines, such as oral corticosteroids, can lead to skin thinning if taken for long periods of time.  A skin tear is often repaired with tape or skin adhesive strips. This keeps the skin that has been peeled off in contact with the healthier skin beneath. Depending on the location of the wound, a bandage (dressing) may be applied over the tape or skin adhesive strips. Sometimes, during the healing process, the skin turns black and dies. Even when this happens, the torn skin acts as a good dressing until the skin underneath gets healthier and repairs itself. HOME CARE INSTRUCTIONS   Change dressings once per day or as directed by your caregiver.  Gently clean the skin tear and the area around the tear using saline solution or mild soap and water.  Do not rub the injured skin dry. Let the area air dry.  Apply petroleum jelly or an antibiotic cream or ointment to keep the tear moist. This will help the wound heal. Do not allow a scab to form.  If the dressing sticks before the next dressing change, moisten it with warm soapy water and gently remove it.  Protect the injured skin until it has healed.  Only take over-the-counter or prescription medicines as directed by your caregiver.  Take showers or baths using warm soapy water. Apply a new dressing after the shower or bath.  Keep all follow-up appointments as directed by your caregiver.  SEEK IMMEDIATE MEDICAL CARE IF:   You have redness, swelling, or increasing pain in the skin tear.  You havepus coming from the skin  tear.  You have chills.  You have a red streak that goes away from the skin tear.  You have a bad smell coming from the tear or dressing.  You have a fever or persistent symptoms for more than 2 3 days.  You have a fever and your symptoms suddenly get worse. MAKE SURE YOU:  Understand these instructions.  Will watch this condition.  Will get help right away if your child is not doing well or gets worse. Document Released: 05/05/2001 Document Revised: 05/04/2012 Document Reviewed: 02/22/2012 [facility] Patient Information 2014 [place].

## 2014-01-27 NOTE — Progress Notes (Addendum)
Subjective:    Patient ID: Jessica Haynes, female    DOB: [birth year], 80 y.o.   MRN: [id number]  HPI Chief Complaint  Patient presents with  . Arm Injury    LT, pt fell 01/25/14, arm is now swollen    This chart was scribed for Jessica Ray, MD by Thea Alken, ED Scribe. This patient was seen in room 2 and the patient's care was started at 11:28 AM.  HPI Comments: Jessica Haynes is a 80 y.o. female who presents to the [facility] complaining of a left arm injury that occurred 2 days ago. Pt reports she fell when stepping on a concrete curb causing her to fall and scrap her left arm. She has tried to treat arm with hot water pads. States she was initially using gauze but used a kleenex yesterday held in place with a rubber band. She now has swelling to left arm that she noticed this morning. Pt reports she able to bend elbow but has pain inside. Pt is right hand dominate.  Patient Active Problem List   Diagnosis Date Noted  . Deafness in right ear   . Endometrial cancer 03/01/2013  . Cancer 02/22/2013  . Dizziness 04/13/2012  . Sleep disorder 04/13/2012  . Macular degeneration 06/08/2011  . BELL'S PALSY 11/25/2009  . VITAMIN B12 DEFICIENCY 11/10/2007  . DYSPNEA ON EXERTION 08/29/2007  . MENINGIOMA 05/13/2007  . HYPOTHYROIDISM 05/13/2007  . HYPERLIPIDEMIA 05/13/2007  . HYPERTENSION 05/13/2007  . HEPATIC CYST 05/13/2007  . Other specified disorders of bladder 05/13/2007   Past Medical History  Diagnosis Date  . Skin cancer   . Meningioma   . Hepatic cyst   . Hypertrophy of kidney   . Irritable bladder   . Hyperlipidemia   . Hypothyroid   . Hypertension   . Cancer 02/22/13    endometrial  . Endometrial cancer 02/2013  . Deafness in right ear   . Macular degeneration    Past Surgical History  Procedure Laterality Date  . Mastoid surgery    . Cataract extraction      bilateral  . Uterine biopsy  06/2013  . No colonoscopy      patient declined    Allergies  Allergen Reactions  . Clarithromycin     REACTION: causes swelling  . Neomycin     REACTION: causes itching and redness  . Penicillins     REACTION: causes swelling   Prior to Admission medications   Medication Sig Start Date End Date Taking? Authorizing Provider  AMBULATORY NON FORMULARY MEDICATION Macular Degeneration capsules  2 daily   Yes Historical Provider, MD  aspirin 81 MG tablet Take 81 mg by mouth 3 (three) times a week.   Yes Historical Provider, MD  hydrochlorothiazide (HYDRODIURIL) 25 MG tablet TAKE 1 TABLET ONCE DAILY. 10/30/13  Yes Neena Rhymes, MD  levothyroxine (SYNTHROID, LEVOTHROID) 100 MCG tablet TAKE 1 TABLET ONCE DAILY. 10/30/13  Yes Neena Rhymes, MD  lisinopril (PRINIVIL,ZESTRIL) 20 MG tablet TAKE 1 TABLET ONCE DAILY. 10/30/13  Yes Neena Rhymes, MD  medroxyPROGESTERone (PROVERA) 10 MG tablet Take 1 tablet (10 mg total) by mouth daily. 09/12/13  Yes Neena Rhymes, MD  sertraline (ZOLOFT) 100 MG tablet TAKE 1 TABLET ONCE DAILY. 06/07/13  Yes Neena Rhymes, MD   History   Social History  . Marital Status: Widowed    Spouse Name: N/A    Number of Children: N/A  . Years of Education:  N/A   Occupational History  . Not on file.   Social History Main Topics  . Smoking status: Never Smoker   . Smokeless tobacco: Not on file  . Alcohol Use: No  . Drug Use: No  . Sexual Activity: Not on file     Comment: G1 P1, menopause in 50's   Other Topics Concern  . Not on file   Social History Narrative   Recently widowed after 42 years   She just recently sold off many household goods and had a finality to her loss.   Lives in independent living at the [facility] patient would want mechanical ventilation, dialysis artificial nutrition and hydration as long as she maintained cognitive function. She states she would not want major surgery.   Review of Systems  Musculoskeletal: Positive for myalgias.  Skin: Positive for  wound.   Objective:   Physical Exam  Nursing note and vitals reviewed. Constitutional: She is oriented to person, place, and time. She appears well-developed and well-nourished. No distress.  HENT:  Head: Normocephalic and atraumatic.  Eyes: Conjunctivae and EOM are normal.  Neck: Normal range of motion. No tracheal deviation present.  Cardiovascular: Normal rate.   Pulmonary/Chest: Effort normal. No respiratory distress.  Musculoskeletal: Normal range of motion.  clavicle left shoulder non tender humorous non tender Right wrist-small abrasion on volar hand. No bony tenderness. Full ROM. No pain with flexion Left wrist- full ROM Left forearm- slight soft tissue swelling distally where she placed the rubber band. Left elbow- Full ROM Tenderness along proximal radius of left elbow. NVI distally.  Multiple superficial skin tears to lateral aspect. Minimal erythema no spreading erythema.  Neurological: She is alert and oriented to person, place, and time.  Skin: Skin is warm and dry.  Psychiatric: She has a normal mood and affect. Her behavior is normal.   [facility] reading (PRIMARY) done by Dr. Carlota Raspberry - L elbow: small anterior fat pad, no post fat pad, no fx seen.      Assessment & Plan:   Jessica Haynes is a 80 y.o. female Injury of elbow, left - Plan: DG Elbow Complete Left  Elbow pain, left - Plan: DG Elbow Complete Left  Skin tear of elbow without complication - Plan: mupirocin ointment (BACTROBAN) 2 %  Contusion of elbow without apparent fracture, skin tears.  Suspect the arm swelling was from tourniquet effect of rubber band.  NVI distally now.  bactroban rx and wound care discussed, loose bandage applied, rtc precautions.   Meds ordered this encounter  Medications  . mupirocin ointment (BACTROBAN) 2 %    Sig: Apply 1 application topically 2 (two) times daily.    Dispense:  22 g    Refill:  0   Patient Instructions  Clean wounds with soap and water once to twice per day,  bactroban ointment, and new bandage.  Return to the clinic or go to the nearest emergency room if any of your symptoms worsen or new symptoms occur.   Skin Tear Care A skin tear is a wound in which the top layer of skin has peeled off. This is a common problem with aging because the skin becomes thinner and more fragile as a person gets older. In addition, some medicines, such as oral corticosteroids, can lead to skin thinning if taken for long periods of time.  A skin tear is often repaired with tape or skin adhesive strips. This keeps the skin that has been peeled  off in contact with the healthier skin beneath. Depending on the location of the wound, a bandage (dressing) may be applied over the tape or skin adhesive strips. Sometimes, during the healing process, the skin turns black and dies. Even when this happens, the torn skin acts as a good dressing until the skin underneath gets healthier and repairs itself. HOME CARE INSTRUCTIONS   Change dressings once per day or as directed by your caregiver.  Gently clean the skin tear and the area around the tear using saline solution or mild soap and water.  Do not rub the injured skin dry. Let the area air dry.  Apply petroleum jelly or an antibiotic cream or ointment to keep the tear moist. This will help the wound heal. Do not allow a scab to form.  If the dressing sticks before the next dressing change, moisten it with warm soapy water and gently remove it.  Protect the injured skin until it has healed.  Only take over-the-counter or prescription medicines as directed by your caregiver.  Take showers or baths using warm soapy water. Apply a new dressing after the shower or bath.  Keep all follow-up appointments as directed by your caregiver.  SEEK IMMEDIATE MEDICAL CARE IF:   You have redness, swelling, or increasing pain in the skin tear.  You havepus coming from the skin tear.  You have chills.  You have a red streak that goes  away from the skin tear.  You have a bad smell coming from the tear or dressing.  You have a fever or persistent symptoms for more than 2 3 days.  You have a fever and your symptoms suddenly get worse. MAKE SURE YOU:  Understand these instructions.  Will watch this condition.  Will get help right away if your child is not doing well or gets worse. Document Released: 05/05/2001 Document Revised: 05/04/2012 Document Reviewed: 02/22/2012 [facility] Patient Information 2014 [place].       I personally performed the services described in this documentation, which was scribed in my presence. The recorded information has been reviewed and considered, and addended by me as needed.

## 2014-02-26 ENCOUNTER — Ambulatory Visit (INDEPENDENT_AMBULATORY_CARE_PROVIDER_SITE_OTHER): Payer: Medicare Other | Admitting: Family Medicine

## 2014-02-26 ENCOUNTER — Encounter: Payer: Self-pay | Admitting: Family Medicine

## 2014-02-26 ENCOUNTER — Telehealth: Payer: Self-pay | Admitting: *Deleted

## 2014-02-26 VITALS — BP 118/78 | HR 91 | Temp 98.2°F | Ht 64.0 in

## 2014-02-26 DIAGNOSIS — Z7689 Persons encountering health services in other specified circumstances: Secondary | ICD-10-CM

## 2014-02-26 DIAGNOSIS — N183 Chronic kidney disease, stage 3 unspecified: Secondary | ICD-10-CM

## 2014-02-26 DIAGNOSIS — F3289 Other specified depressive episodes: Secondary | ICD-10-CM

## 2014-02-26 DIAGNOSIS — F32A Depression, unspecified: Secondary | ICD-10-CM

## 2014-02-26 DIAGNOSIS — R197 Diarrhea, unspecified: Secondary | ICD-10-CM

## 2014-02-26 DIAGNOSIS — D320 Benign neoplasm of cerebral meninges: Secondary | ICD-10-CM

## 2014-02-26 DIAGNOSIS — Z7189 Other specified counseling: Secondary | ICD-10-CM

## 2014-02-26 DIAGNOSIS — C549 Malignant neoplasm of corpus uteri, unspecified: Secondary | ICD-10-CM

## 2014-02-26 DIAGNOSIS — F329 Major depressive disorder, single episode, unspecified: Secondary | ICD-10-CM

## 2014-02-26 DIAGNOSIS — I10 Essential (primary) hypertension: Secondary | ICD-10-CM

## 2014-02-26 DIAGNOSIS — C541 Malignant neoplasm of endometrium: Secondary | ICD-10-CM

## 2014-02-26 DIAGNOSIS — K529 Noninfective gastroenteritis and colitis, unspecified: Secondary | ICD-10-CM

## 2014-02-26 DIAGNOSIS — G510 Bell's palsy: Secondary | ICD-10-CM

## 2014-02-26 LAB — BASIC METABOLIC PANEL
BUN: 28 mg/dL — ABNORMAL HIGH (ref 6–23)
CHLORIDE: 110 meq/L (ref 96–112)
CO2: 22 meq/L (ref 19–32)
Calcium: 9.7 mg/dL (ref 8.4–10.5)
Creatinine, Ser: 1.7 mg/dL — ABNORMAL HIGH (ref 0.4–1.2)
GFR: 30.92 mL/min — ABNORMAL LOW (ref >60.00)
Glucose, Bld: 96 mg/dL (ref 70–99)
POTASSIUM: 4 meq/L (ref 3.5–5.1)
SODIUM: 139 meq/L (ref 135–145)

## 2014-02-26 NOTE — Progress Notes (Signed)
No chief complaint on file.   HPI:  Jessica Haynes is here to establish care.  She used to see Dr. Linda Hedges. Has the following chronic problems and concerns today:  Patient Active Problem List   Diagnosis Date Noted  . Deafness in right ear   . Endometrial cancer 03/01/2013  . Macular degeneration 06/08/2011  . BELL'S PALSY 11/25/2009  . VITAMIN B12 DEFICIENCY 11/10/2007  . MENINGIOMA 05/13/2007  . HYPOTHYROIDISM 05/13/2007  . HYPERLIPIDEMIA 05/13/2007  . HYPERTENSION 05/13/2007  . Other specified disorders of bladder 05/13/2007   Diarrhea: -watery diarrhea for a few months -symtoms: occ growling of stomach, 3-4 episodes of watery diarrhea -denies: abd pain, nausea, vomiting, weight loss (refuses to check weight), fevers, mucus or blood in stool, malaise, foreign travel or antibiotic in the last 6 months  Uterine cancer: -reports told to have surgery but did not want to do this -thinks is cure/prevented by hormones which Dr. Linda Hedges was prescribing -sees Dr. Alycia Rossetti for this  HTN/CKD: Taking asa, hctz and lisinopril -denies: CP, SOB, swelling -progressive kidney disease - worsening on last set of labs in May with PCP  Hypothyroidism: -on levothyroixine and stable  Bells palsy/ meningioma: -reports chronic drooping of face and intermittent vertigo and refuses treatment or workup  Depression: -stable, no SI  Health Maintenance:  ROS: See pertinent positives and negatives per HPI.  Past Medical History  Diagnosis Date  . Skin cancer   . Meningioma   . Hepatic cyst   . Hypertrophy of kidney   . Irritable bladder   . Hyperlipidemia   . Hypothyroid   . Hypertension   . Cancer 02/22/13    endometrial  . Endometrial cancer 02/2013  . Deafness in right ear   . Macular degeneration   . HEPATIC CYST 05/13/2007    Qualifier: Diagnosis of  By: Danny Lawless CMA, [place]      Family History  Problem Relation Age of Onset  . Cancer Daughter 75    breast  . Stroke Mother    . Cancer Sister     1 sister w/breast  . Colon cancer Neg Hx     History   Social History  . Marital Status: Widowed    Spouse Name: N/A    Number of Children: N/A  . Years of Education: N/A   Social History Main Topics  . Smoking status: Never Smoker   . Smokeless tobacco: None  . Alcohol Use: No  . Drug Use: No  . Sexual Activity: None     Comment: G1 P1, menopause in 50's   Other Topics Concern  . None   Social History Narrative   Recently widowed after 66 years   She just recently sold off many household goods and had a finality to her loss.   Lives in independent living at the [place] [facility] patient would want mechanical ventilation, dialysis artificial nutrition and hydration as long as she maintained cognitive function. She states she would not want major surgery.    Current outpatient prescriptions:aspirin 81 MG tablet, Take 81 mg by mouth 3 (three) times a week., Disp: , Rfl: ;  hydrochlorothiazide (HYDRODIURIL) 25 MG tablet, TAKE 1 TABLET ONCE DAILY., Disp: 30 tablet, Rfl: 5;  levothyroxine (SYNTHROID, LEVOTHROID) 100 MCG tablet, TAKE 1 TABLET ONCE DAILY., Disp: 30 tablet, Rfl: 5;  lisinopril (PRINIVIL,ZESTRIL) 20 MG tablet, TAKE 1 TABLET ONCE DAILY., Disp: 30 tablet, Rfl: 5 medroxyPROGESTERone (PROVERA) 10 MG tablet, Take 1 tablet (10 mg  total) by mouth daily., Disp: 30 tablet, Rfl: 5;  sertraline (ZOLOFT) 100 MG tablet, TAKE 1 TABLET ONCE DAILY., Disp: 90 tablet, Rfl: 3;  tobramycin-dexamethasone (TOBRADEX) ophthalmic ointment, 1 application 3 (three) times daily., Disp: , Rfl:   EXAM:  Filed Vitals:   02/26/14 1135  BP: 118/78  Pulse: 91  Temp: 98.2 F (36.8 C)    Body mass index is 0.00 kg/(m^2).  GENERAL: vitals reviewed and listed above, alert, oriented, appears well hydrated and in no acute distress  HEENT: atraumatic, conjunttiva clear, no obvious abnormalities on inspection of external nose and ears  NECK: no obvious masses on  inspection  LUNGS: clear to auscultation bilaterally, no wheezes, rales or rhonchi, good air movement  CV: HRRR, no peripheral edema  MS: moves all extremities without noticeable abnormality  PSYCH: pleasant and cooperative, no obvious depression or anxiety  ASSESSMENT AND PLAN:  Discussed the following assessment and plan:  MENINGIOMA Bell's palsy -chronic, defers any evaluation  HYPERTENSION -continue medications, hctz and lisinopril  Endometrial cancer -advised I do not manage or treat this and will not rx the provera aas advise management by specialist skilled and knowledgeable in this treatment and follow up - advised my assistant to contact gyn/onc for follow up appt  Chronic diarrhea - Plan: Ambulatory referral to Gastroenterology -we discussed possible serious and likely etiologies, workup and treatment, treatment risks and return precautions -after this discussion, Adisynn opted for evaluation by GI as is interfering with her life and has tried metamucil, imodium, probiotic without help -she refused to provide weight or stool sample today -follow up advised  -of course, we advised Renata  to return or notify a doctor immediately if symptoms worsen or persist or new concerns arise.  CKD (chronic kidney disease), stage 3 (moderate) - Plan: Basic metabolic panel -advised BMP today  Depression: -continue zoloft  -We reviewed the PMH, PSH, FH, SH, Meds and Allergies. -We provided refills for any medications we will prescribe as needed. -We addressed current concerns per orders and patient instructions. -We have asked for records for pertinent exams, studies, vaccines and notes from previous providers. -We have advised patient to follow up per instructions below.   -Patient advised to return or notify a doctor immediately if symptoms worsen or persist or new concerns arise.  Patient Instructions  -We placed a referral for you as discussed to the Gastroenterologist for  the diarrhea. It usually takes about 1-2 weeks to process and schedule this referral. If you have not heard from [place] regarding this appointment in 2 weeks please contact our office.  -please follow up with your Gyneocology and Oncology doctor regarding treatment of your uterine cancer per appointment details provided. I do not treat this and would advise they prescribe your hormone therapy to optimize your treatment and follow up for this.  -We have ordered labs or studies at this visit to recheck your kidney function. It can take up to 1-2 weeks for results and processing. We will contact you with instructions IF your results are abnormal. Normal results will be released to your [facility]. If you have not heard from [place] or can not find your results in [facility] in 2 weeks please contact our office.      Colin Benton R.

## 2014-02-26 NOTE — Progress Notes (Signed)
Pre visit review using our clinic review tool, if applicable. No additional management support is needed unless otherwise documented below in the visit note. 

## 2014-02-26 NOTE — Patient Instructions (Addendum)
-  We placed a referral for you as discussed to the Gastroenterologist for the diarrhea. It usually takes about 1-2 weeks to process and schedule this referral. If you have not heard from [place] regarding this appointment in 2 weeks please contact our office.  -please follow up with your Gyneocology and Oncology doctor regarding treatment of your uterine cancer per appointment details provided. I do not treat this and would advise they prescribe your hormone therapy to optimize your treatment and follow up for this.  -We have ordered labs or studies at this visit to recheck your kidney function. It can take up to 1-2 weeks for results and processing. We will contact you with instructions IF your results are abnormal. Normal results will be released to your [facility]. If you have not heard from [place] or can not find your results in [facility] in 2 weeks please contact our office.

## 2014-02-26 NOTE — Telephone Encounter (Signed)
Per Dr Maudie Mercury the pt needs to see Dr Nancy Marus to follow up for endometrial cancer.  I called Dr Elenora Gamma office and spoke with Cam and she stated the pt would not be able to see Dr Alycia Rossetti but she scheduled the pt an appt to see Dr Denman George on 03/12/2014 and to arrive at 1:15pm and the pt was given this information.

## 2014-02-27 ENCOUNTER — Telehealth: Payer: Self-pay | Admitting: Family Medicine

## 2014-02-27 ENCOUNTER — Telehealth: Payer: Self-pay | Admitting: *Deleted

## 2014-02-27 NOTE — Telephone Encounter (Signed)
Patient informed of lab results. 

## 2014-02-27 NOTE — Telephone Encounter (Signed)
Relevant patient education assigned to patient using Emmi. ° °

## 2014-02-27 NOTE — Telephone Encounter (Signed)
Toney Sang called to cancel Jessica Haynes appt scheduled for March 12, 2014 with Dr.Rossi. Pt is being seen by another MD

## 2014-03-05 ENCOUNTER — Telehealth: Payer: Self-pay | Admitting: Family Medicine

## 2014-03-05 NOTE — Telephone Encounter (Signed)
Noted. Please let patient/son in law know that I do advise she establish care with another primary care doctor if she does not wish to see me as I would like for her to be able to live the best she can at 22! Please provide them the [place] number to call [phone] if they need a list of primary care doctors. Please advise we will provide care for 30 days if needed in the interim while they find another doctor.

## 2014-03-05 NOTE — Telephone Encounter (Signed)
Pt's son-in-law called and wanted to make dr. Maudie Mercury aware that the pt states that she is a wonderful doctor however she was just to young for her. Pt has decided not to go to any of the specialist, states she is just going to live her life until its her time. Didn't feel as though it was any need since she is 80 yrs old.

## 2014-03-05 NOTE — Telephone Encounter (Signed)
Patient's son, Geoffry Paradise was informed.

## 2014-03-07 ENCOUNTER — Telehealth: Payer: Self-pay | Admitting: Family Medicine

## 2014-03-07 NOTE — Telephone Encounter (Signed)
[facility] [place], [address]. Is requesting re-fill on medroxyPROGESTERone (PROVERA) 10 MG tablet

## 2014-03-08 NOTE — Telephone Encounter (Signed)
Rx denial called to [facility] at [phone].

## 2014-03-08 NOTE — Telephone Encounter (Signed)
Pt has decided not to see [place] and we scheduled them with a specialist for management of this.

## 2014-03-09 ENCOUNTER — Other Ambulatory Visit: Payer: Self-pay | Admitting: *Deleted

## 2014-03-09 MED ORDER — MEDROXYPROGESTERONE ACETATE 10 MG PO TABS: 10.0000 mg | ORAL_TABLET | Freq: Every day | ORAL | Status: DC

## 2014-03-09 NOTE — Telephone Encounter (Signed)
Dr Maudie Mercury approved the refill and this was sent to the pts pharmacy.

## 2014-03-10 ENCOUNTER — Ambulatory Visit (INDEPENDENT_AMBULATORY_CARE_PROVIDER_SITE_OTHER): Payer: Medicare Other | Admitting: Family Medicine

## 2014-03-10 VITALS — BP 132/58 | HR 103 | Temp 98.2°F | Resp 18 | Ht 64.0 in

## 2014-03-10 DIAGNOSIS — Z3009 Encounter for other general counseling and advice on contraception: Secondary | ICD-10-CM

## 2014-03-10 DIAGNOSIS — R197 Diarrhea, unspecified: Secondary | ICD-10-CM

## 2014-03-10 NOTE — Progress Notes (Signed)
Subjective:    Patient ID: Jessica Haynes, female    DOB: 1923-06-14, 80 y.o.   MRN: [id number] This chart was scribed for Merri Ray, MD by Vernell Barrier, Medical Scribe. The patient was seen in room 09. This patient's care was started at 10:39 AM.  HPI HPI Comments: Jessica Haynes is a 80 y.o. female who presents to the [facility] for chronic diarrhea follow up. Followed by PCP Dr. Colin Benton. Per notes in sx, pt planned on transferring here to other primary care from phone note 5 days ago. Multiple issues addressed at July 6th visit with Dr. Maudie Mercury including chronic diarrhea. Per that note, diarrhea for few months. Watery with 3-4 episodes per day. Had been seen by Dr. Unice Cobble in May 2013 for diarrhea. Diarrhea for 3 months at that time. She had been treating diarrhea with Imodium and change in diet without appreciable improvement. Prescribed a probiotic but apparently this was not taken. Planned for evaluation with gastroenterology at visit 12 days ago. As she had taken metamucil, Imodium and apparently Probiotic without any help. Stool testing was refused as well as weight assessment last visit as well as today. Per phone note, she had declined going to any specialist.   In between doctors. States initial provider retired so was assigned to Dr. Maudie Mercury. Reports dissatisfaction with Dr. Maudie Mercury. Feels she only wanted to refer her to specialists based on what was in her chart as opposed to what was actually wrong with her or what she was in for during the visit. Was referred to Dr. Carlota Raspberry by family members. Diagnosed with cervical cancer and choosing not to have any treatment. Believes she only has 2-3 years of life remaining and would like them spent without chemotherapy or other forms of cancer treatment.   Presents with 3-4 months of persistent diarrhea. States 3-4 times a day she will get the urge to have a BM and cannot make it to the bathroom in time unless she is  really close by. Reports very watery loose stool. No hematochezia. No pain associated. Been on Florastor probiotic which she admits did give her some intermittent hard stools but still mostly watery. Hx of urinary incontinence; wears incontinence pads. Managing well. States sxs are affecting her quality of life. Aafraid to leave the house if she does not know where a bathroom is. Will feel fine and out of nowhere reports sudden onset sensation for BM. Denies fever or nausea.   Patient Active Problem List   Diagnosis Date Noted  . Deafness in right ear   . Endometrial cancer 03/01/2013  . Macular degeneration 06/08/2011  . BELL'S PALSY 11/25/2009  . VITAMIN B12 DEFICIENCY 11/10/2007  . MENINGIOMA 05/13/2007  . HYPOTHYROIDISM 05/13/2007  . HYPERLIPIDEMIA 05/13/2007  . HYPERTENSION 05/13/2007  . Other specified disorders of bladder 05/13/2007   Past Medical History  Diagnosis Date  . Skin cancer   . Meningioma   . Hepatic cyst   . Hypertrophy of kidney   . Irritable bladder   . Hyperlipidemia   . Hypothyroid   . Hypertension   . Cancer 02/22/13    endometrial  . Endometrial cancer 02/2013  . Deafness in right ear   . Macular degeneration   . HEPATIC CYST 05/13/2007    Qualifier: Diagnosis of  By: [place], [place]     Past Surgical History  Procedure Laterality Date  . Mastoid surgery    . Cataract extraction      bilateral  .  Uterine biopsy  06/2013  . No colonoscopy      patient declined   Allergies  Allergen Reactions  . Clarithromycin     REACTION: causes swelling  . Neomycin     REACTION: causes itching and redness  . Penicillins     REACTION: causes swelling   Prior to Admission medications   Medication Sig Start Date End Date Taking? Authorizing Provider  aspirin 81 MG tablet Take 81 mg by mouth 3 (three) times a week.   Yes Historical Provider, MD  hydrochlorothiazide (HYDRODIURIL) 25 MG tablet TAKE 1 TABLET ONCE DAILY. 10/30/13  Yes Neena Rhymes, MD    levothyroxine (SYNTHROID, LEVOTHROID) 100 MCG tablet TAKE 1 TABLET ONCE DAILY. 10/30/13  Yes Neena Rhymes, MD  lisinopril (PRINIVIL,ZESTRIL) 20 MG tablet TAKE 1 TABLET ONCE DAILY. 10/30/13  Yes Neena Rhymes, MD  medroxyPROGESTERone (PROVERA) 10 MG tablet Take 1 tablet (10 mg total) by mouth daily. 03/09/14  Yes Lucretia Kern, DO  mupirocin ointment (BACTROBAN) 2 % Apply 1 application topically daily.   Yes Historical Provider, MD  sertraline (ZOLOFT) 100 MG tablet TAKE 1 TABLET ONCE DAILY. 06/07/13   Neena Rhymes, MD  tobramycin-dexamethasone [facility]) ophthalmic ointment 1 application 3 (three) times daily.    Historical Provider, MD   History   Social History  . Marital Status: Widowed    Spouse Name: N/A    Number of Children: N/A  . Years of Education: N/A   Occupational History  . Not on file.   Social History Main Topics  . Smoking status: Never Smoker   . Smokeless tobacco: Not on file  . Alcohol Use: No  . Drug Use: No  . Sexual Activity: Not on file     Comment: G1 P1, menopause in 50's   Other Topics Concern  . Not on file   Social History Narrative   Recently widowed after 3 years   She just recently sold off many household goods and had a finality to her loss.   Lives in independent living at the [facility] patient would want mechanical ventilation, dialysis artificial nutrition and hydration as long as she maintained cognitive function. She states she would not want major surgery.   Review of Systems  Constitutional: Negative for fever.  Gastrointestinal: Positive for diarrhea. Negative for nausea and blood in stool.   Objective:  Physical Exam  Vitals reviewed. Constitutional: She is oriented to person, place, and time. She appears well-developed and well-nourished. No distress.  HENT:  Head: Normocephalic and atraumatic.  Eyes: Conjunctivae and EOM are normal.  Neck: Neck supple.  Cardiovascular: Normal rate, regular rhythm and  normal heart sounds.   No murmur heard. Pulmonary/Chest: Effort normal and breath sounds normal. She has no wheezes. She has no rales.  Abdominal: Soft. She exhibits no mass. There is no tenderness.  Musculoskeletal: Normal range of motion.  Lymphadenopathy:    She has no cervical adenopathy.  Neurological: She is alert and oriented to person, place, and time.  Skin: Skin is warm and dry.  Psychiatric: She has a normal mood and affect. Her behavior is normal.    Filed Vitals:   03/10/14 0953  BP: 132/58  Pulse: 103  Temp: 98.2 F (36.8 C)  TempSrc: Oral  Resp: 18  Height: 5\' 4"  (1.626 m)  SpO2: 96%    Assessment & Plan:   Jessica Haynes is a 80 y.o. female Diarrhea - Plan: Stool culture, Ova  and parasite examination, Clostridium Difficile by PCR, Fecal leukocytes  -prior eval and recommendations noted form previous primary provider's office. Agree with stool studies intially - check culture, O and P, and C diff testing, but if these are normal - refer to GI for eval.   Other general counseling and advice for contraceptive management  -discussed change of PCP as not happy with prior office.  Discussed that best fit would likely be geriatrician, and number provided.  Discussed, however,. That we are happy to see her here for any acute issues.   Meds ordered this encounter  Medications  . mupirocin ointment (BACTROBAN) 2 %    Sig: Apply 1 application topically daily.   Patient Instructions  As we discussed, for primary care - I would recommend [facility]: [phone]  We are still happy to see you for acute concerns.  For the diarrhea - can check for infections on stool testing, but if these tests are normal, recommend evaluation with gastroenterologist as previously recommended.  We can wait on results first, then make a referral then if needed. Return to the clinic or go to the nearest emergency room if any of your symptoms worsen or new symptoms  occur.  Diarrhea Diarrhea is frequent loose and watery bowel movements. It can cause you to feel weak and dehydrated. Dehydration can cause you to become tired and thirsty, have a dry mouth, and have decreased urination that often is dark yellow. Diarrhea is a sign of another problem, most often an infection that will not last long. In most cases, diarrhea typically lasts 2-3 days. However, it can last longer if it is a sign of something more serious. It is important to treat your diarrhea as directed by your caregive to lessen or prevent future episodes of diarrhea. CAUSES  Some common causes include:  Gastrointestinal infections caused by viruses, bacteria, or parasites.  Food poisoning or food allergies.  Certain medicines, such as antibiotics, chemotherapy, and laxatives.  Artificial sweeteners and fructose.  Digestive disorders. HOME CARE INSTRUCTIONS  Ensure adequate fluid intake (hydration): have 1 cup (8 oz) of fluid for each diarrhea episode. Avoid fluids that contain simple sugars or sports drinks, fruit juices, whole milk products, and sodas. Your urine should be clear or pale yellow if you are drinking enough fluids. Hydrate with an oral rehydration solution that you can purchase at pharmacies, retail stores, and online. You can prepare an oral rehydration solution at home by mixing the following ingredients together:   - tsp table salt.   tsp baking soda.   tsp salt substitute containing potassium chloride.  1  tablespoons sugar.  1 L (34 oz) of water.  Certain foods and beverages may increase the speed at which food moves through the gastrointestinal (GI) tract. These foods and beverages should be avoided and include:  Caffeinated and alcoholic beverages.  High-fiber foods, such as raw fruits and vegetables, nuts, seeds, and whole grain breads and cereals.  Foods and beverages sweetened with sugar alcohols, such as xylitol, sorbitol, and mannitol.  Some foods may  be well tolerated and may help thicken stool including:  Starchy foods, such as rice, toast, pasta, low-sugar cereal, oatmeal, grits, baked potatoes, crackers, and bagels.  Bananas.  Applesauce.  Add probiotic-rich foods to help increase healthy bacteria in the GI tract, such as yogurt and fermented milk products.  Wash your hands well after each diarrhea episode.  Only take over-the-counter or prescription medicines as directed by your caregiver.  Take a warm bath to relieve any burning or pain from frequent diarrhea episodes. SEEK IMMEDIATE MEDICAL CARE IF:   You are unable to keep fluids down.  You have persistent vomiting.  You have blood in your stool, or your stools are black and tarry.  You do not urinate in 6-8 hours, or there is only a small amount of very dark urine.  You have abdominal pain that increases or localizes.  You have weakness, dizziness, confusion, or lightheadedness.  You have a severe headache.  Your diarrhea gets worse or does not get better.  You have a fever or persistent symptoms for more than 2-3 days.  You have a fever and your symptoms suddenly get worse. MAKE SURE YOU:   Understand these instructions.  Will watch your condition.  Will get help right away if you are not doing well or get worse. Document Released: 07/31/2002 Document Revised: 07/27/2012 Document Reviewed: 04/17/2012 [facility] Patient Information 2015 [place], [place]. This information is not intended to replace advice given to you by your health care provider. Make sure you discuss any questions you have with your health care provider.       I personally performed the services described in this documentation, which was scribed in my presence. The recorded information has been reviewed and is accurate.

## 2014-03-10 NOTE — Patient Instructions (Signed)
As we discussed, for primary care - I would recommend [facility]: [phone]  We are still happy to see you for acute concerns.  For the diarrhea - can check for infections on stool testing, but if these tests are normal, recommend evaluation with gastroenterologist as previously recommended.  We can wait on results first, then make a referral then if needed. Return to the clinic or go to the nearest emergency room if any of your symptoms worsen or new symptoms occur.  Diarrhea Diarrhea is frequent loose and watery bowel movements. It can cause you to feel weak and dehydrated. Dehydration can cause you to become tired and thirsty, have a dry mouth, and have decreased urination that often is dark yellow. Diarrhea is a sign of another problem, most often an infection that will not last long. In most cases, diarrhea typically lasts 2-3 days. However, it can last longer if it is a sign of something more serious. It is important to treat your diarrhea as directed by your caregive to lessen or prevent future episodes of diarrhea. CAUSES  Some common causes include:  Gastrointestinal infections caused by viruses, bacteria, or parasites.  Food poisoning or food allergies.  Certain medicines, such as antibiotics, chemotherapy, and laxatives.  Artificial sweeteners and fructose.  Digestive disorders. HOME CARE INSTRUCTIONS  Ensure adequate fluid intake (hydration): have 1 cup (8 oz) of fluid for each diarrhea episode. Avoid fluids that contain simple sugars or sports drinks, fruit juices, whole milk products, and sodas. Your urine should be clear or pale yellow if you are drinking enough fluids. Hydrate with an oral rehydration solution that you can purchase at pharmacies, retail stores, and online. You can prepare an oral rehydration solution at home by mixing the following ingredients together:   - tsp table salt.   tsp baking soda.   tsp salt substitute containing  potassium chloride.  1  tablespoons sugar.  1 L (34 oz) of water.  Certain foods and beverages may increase the speed at which food moves through the gastrointestinal (GI) tract. These foods and beverages should be avoided and include:  Caffeinated and alcoholic beverages.  High-fiber foods, such as raw fruits and vegetables, nuts, seeds, and whole grain breads and cereals.  Foods and beverages sweetened with sugar alcohols, such as xylitol, sorbitol, and mannitol.  Some foods may be well tolerated and may help thicken stool including:  Starchy foods, such as rice, toast, pasta, low-sugar cereal, oatmeal, grits, baked potatoes, crackers, and bagels.  Bananas.  Applesauce.  Add probiotic-rich foods to help increase healthy bacteria in the GI tract, such as yogurt and fermented milk products.  Wash your hands well after each diarrhea episode.  Only take over-the-counter or prescription medicines as directed by your caregiver.  Take a warm bath to relieve any burning or pain from frequent diarrhea episodes. SEEK IMMEDIATE MEDICAL CARE IF:   You are unable to keep fluids down.  You have persistent vomiting.  You have blood in your stool, or your stools are black and tarry.  You do not urinate in 6-8 hours, or there is only a small amount of very dark urine.  You have abdominal pain that increases or localizes.  You have weakness, dizziness, confusion, or lightheadedness.  You have a severe headache.  Your diarrhea gets worse or does not get better.  You have a fever or persistent symptoms for more than 2-3 days.  You have a fever and your symptoms suddenly get worse.  MAKE SURE YOU:   Understand these instructions.  Will watch your condition.  Will get help right away if you are not doing well or get worse. Document Released: 07/31/2002 Document Revised: 07/27/2012 Document Reviewed: 04/17/2012 [facility] Patient Information 2015 [place], [place]. This information is  not intended to replace advice given to you by your health care provider. Make sure you discuss any questions you have with your health care provider.

## 2014-03-12 ENCOUNTER — Ambulatory Visit: Payer: Medicare Other | Admitting: Gynecologic Oncology

## 2014-03-13 LAB — CLOSTRIDIUM DIFFICILE BY PCR: Toxigenic C. Difficile by PCR: NOT DETECTED

## 2014-03-14 LAB — STOOL CELLS, WBC & RBC
RBC/40X FIELD: 0
WBC/40X FIELD (SOL): 0

## 2014-03-14 LAB — OVA AND PARASITE EXAMINATION: OP: NONE SEEN

## 2014-03-16 LAB — STOOL CULTURE

## 2014-03-19 ENCOUNTER — Telehealth: Payer: Self-pay | Admitting: Family Medicine

## 2014-03-19 ENCOUNTER — Encounter: Payer: Self-pay | Admitting: *Deleted

## 2014-03-19 NOTE — Telephone Encounter (Signed)
Jessica Haynes, son-in-law states she is till having diarrhea, which she has been having over the last 8 months,  He believes the diarrhea is a side effect from her cancer tx, states she is having 3-4 loose watery stools daily. Is there anything she can try to alleviate this issue.  Please advise.

## 2014-03-20 NOTE — Telephone Encounter (Signed)
See prior note/lab notes. Labs were negative for infection.  Recommend she see gastroenterologist as planned by Dr. Maudie Mercury - does she have appointment scheduled? If not we can try to have her worked in this week if possible to any GI. As far as treatment of diarrhea now, i donlt have any new recommendations from what she was told prior - probiotic, episodic use of immodium.  I know this has been going on for some time which is why I think she needs to see gastroenterologist.

## 2014-03-20 NOTE — Telephone Encounter (Signed)
   03/19/14:Jessica Haynes, son-in-law states she is till having diarrhea, which she has been having over the last 8 months, He believes the diarrhea is a side effect from her cancer tx, states she is having 3-4 loose watery stools daily. Is there anything she can try to alleviate this issue.  Please advise   03/20/14: Pt's son in law called again. i told him i would route the message straight to you.

## 2014-03-23 NOTE — Telephone Encounter (Signed)
Lm for [facility]. [phone]

## 2014-03-27 ENCOUNTER — Encounter: Payer: Self-pay | Admitting: Gastroenterology

## 2014-03-27 NOTE — Telephone Encounter (Signed)
Pt son in law asked me to call pt and leave a message that she needs to go to the GI. He is also going to get his wife to try to convince her to go.  I have called and left a voicemail as requested with the information below.  [facility]  [place]  Address: [address], [place], [place] [address]  [phone]

## 2014-04-02 ENCOUNTER — Encounter: Payer: Self-pay | Admitting: *Deleted

## 2014-04-06 ENCOUNTER — Encounter: Payer: Self-pay | Admitting: Gastroenterology

## 2014-04-06 ENCOUNTER — Other Ambulatory Visit (INDEPENDENT_AMBULATORY_CARE_PROVIDER_SITE_OTHER): Payer: Medicare Other

## 2014-04-06 ENCOUNTER — Ambulatory Visit (INDEPENDENT_AMBULATORY_CARE_PROVIDER_SITE_OTHER): Payer: Medicare Other | Admitting: Gastroenterology

## 2014-04-06 VITALS — BP 126/58 | HR 96 | Ht 64.0 in

## 2014-04-06 DIAGNOSIS — R197 Diarrhea, unspecified: Secondary | ICD-10-CM

## 2014-04-06 LAB — IGA: IgA: 109 mg/dL (ref 68–378)

## 2014-04-06 MED ORDER — CHOLESTYRAMINE 4 G PO PACK: 4.0000 g | PACK | Freq: Every day | ORAL | Status: DC

## 2014-04-06 NOTE — Patient Instructions (Signed)
Your physician has requested that you go to the basement for the following lab work before leaving today: TTG IGA  We have sent the following medications to your pharmacy for you to pick up at your convenience: Questran, Alpha 2 Bull[place]   Follow up visit with Dr. Hilarie Fredrickson is scheduled for 06-08-2014 at 9 am

## 2014-04-06 NOTE — Progress Notes (Addendum)
04/06/2014 Jessica Haynes [id number] 09-20-1922   HISTORY OF PRESENT ILLNESS:  This is an 80 year old female with PMH of HTN, HLD, hypothyroid, anxiety, vertigo, and endometrial cancer (just taking medication, does not want surgery).  She presents to our office today with complaints of diarrhea for the past 6 months.  Having 3-4 BM's per day that are usually liquid but recently had a little form to them.  Has urgency.  Denies nocturnal BM's or blood in the stool.  No nausea, vomiting, abdominal pain, or weight loss.  She tells me that she does not want a lot of evaluation at her age and just wants a medication to help the diarrhea.  She had stool studies performed, which were negative for Cdiff, O & P, enteric pathogens, and WBC's.  TSH was normal in 12/2013 as well as CBC.  She had a colonoscopy in 11/2009, which showed only pan-diverticulosis.   Past Medical History  Diagnosis Date  . Skin cancer   . Meningioma   . Hypertrophy of kidney   . Irritable bladder   . Hyperlipidemia   . Hypothyroid   . Hypertension   . Endometrial cancer 02/2013  . Deafness in right ear   . Macular degeneration   . HEPATIC CYST 05/13/2007    Qualifier: Diagnosis of  By: [place], [place]    . Anxiety   . Diverticulosis   . Shingles    Past Surgical History  Procedure Laterality Date  . Mastoid surgery    . Cataract extraction Bilateral   . Uterine biopsy  06/2013  . Colonoscopy  11/2009    normal  . Tonsillectomy      reports that she has never smoked. She has never used smokeless tobacco. She reports that she does not drink alcohol or use illicit drugs. family history includes Breast cancer in her sister; Breast cancer (age of onset: 75) in her daughter; Stroke in her mother. There is no history of Colon cancer. Allergies  Allergen Reactions  . Clarithromycin     REACTION: causes swelling  . Neomycin     REACTION: causes itching and redness  . Penicillins     REACTION: causes swelling       Outpatient Encounter Prescriptions as of 04/06/2014  Medication Sig  . AMBULATORY NON FORMULARY MEDICATION Macular Dege Tabs Take 1 tablet by mouth twice a day  . aspirin 81 MG tablet Take 81 mg by mouth 3 (three) times a week.  . hydrochlorothiazide (HYDRODIURIL) 25 MG tablet TAKE 1 TABLET ONCE DAILY.  Marland Kitchen levothyroxine (SYNTHROID, LEVOTHROID) 100 MCG tablet TAKE 1 TABLET ONCE DAILY.  Marland Kitchen lisinopril (PRINIVIL,ZESTRIL) 20 MG tablet TAKE 1 TABLET ONCE DAILY.  . medroxyPROGESTERone (PROVERA) 10 MG tablet Take 1 tablet (10 mg total) by mouth daily.  . sertraline (ZOLOFT) 100 MG tablet TAKE 1 TABLET ONCE DAILY.  . cholestyramine (QUESTRAN) 4 G packet Take 1 packet (4 g total) by mouth daily.  . mupirocin ointment (BACTROBAN) 2 % Apply 1 application topically daily.  . [DISCONTINUED] tobramycin-dexamethasone (TOBRADEX) ophthalmic ointment 1 application 3 (three) times daily.     REVIEW OF SYSTEMS  : All other systems reviewed and negative except where noted in the History of Present Illness.   PHYSICAL EXAM: BP 126/58  Pulse 96  Ht 5\' 4"  (1.626 m) General: Well developed white female in no acute distress Head: Normocephalic and atraumatic Eyes:  Sclerae anicteric, conjunctiva pink. Ears: Normal auditory acuity  Lungs: Clear throughout to auscultation  Heart: Regular rate and rhythm Abdomen: Soft, non-distended.  Normal bowel sounds.  Non-tender. Musculoskeletal: Symmetrical with no gross deformities  Skin: No lesions on visible extremities Extremities: No edema Psychological:  Alert and cooperative. Normal mood and affect  ASSESSMENT AND PLAN: -Diarrhea:  Chronic x 6 months.  ? If it is due to her change in diet with increase fiber intake.  She does not want a lot of testing.  Stool studies negative.  She would just like a medication to try.  Will try questran once daily.  She is willing to have labs drawn so will check celiac labs.  Follow-up in 8 weeks or sooner if  needed.  Addendum: Reviewed and agree with initial management. Jerene Bears, MD

## 2014-04-08 LAB — T-TRANSGLUTAMINASE (TTG) IGG: Tissue Transglut Ab: 2 U/mL (ref 0–5)

## 2014-04-10 ENCOUNTER — Other Ambulatory Visit: Payer: Self-pay | Admitting: Family Medicine

## 2014-04-10 NOTE — Telephone Encounter (Signed)
Rx denial called to [facility] at [phone].

## 2014-06-08 ENCOUNTER — Encounter: Payer: Self-pay | Admitting: Internal Medicine

## 2014-06-08 ENCOUNTER — Ambulatory Visit (INDEPENDENT_AMBULATORY_CARE_PROVIDER_SITE_OTHER): Payer: Medicare Other | Admitting: Internal Medicine

## 2014-06-08 VITALS — BP 122/58 | HR 84 | Ht 64.0 in

## 2014-06-08 DIAGNOSIS — K529 Noninfective gastroenteritis and colitis, unspecified: Secondary | ICD-10-CM

## 2014-06-08 MED ORDER — COLESTIPOL HCL 1 G PO TABS: 1.0000 g | ORAL_TABLET | Freq: Two times a day (BID) | ORAL | Status: DC

## 2014-06-08 NOTE — Patient Instructions (Addendum)
Your prescription has been sent to your pharmacy   (Colestid) Follow up as needed

## 2014-06-08 NOTE — Progress Notes (Signed)
Subjective:    Patient ID: Jessica Haynes, female    DOB: [birth year], 80 y.o.   MRN: [id number]  HPI Annalysia Willenbring is a 80 yo female with PMH of hypertension, hyperlipidemia, hypothyroidism, anxiety, vertigo, endometrial cancer who is seen in followup for chronic diarrhea. She was seen by Alonza Bogus, PA-C on 04/06/2014. At that time she was having diarrhea loose and watery stools 3-4 times per day. There was increased urgency. There is no blood in her stool or melena. No weight loss. No abdominal pain. Prior to this visit stool studies have been performed which were negative, her thyroid was normal. She had a colonoscopy in April 2011 which was normal except for pandiverticulosis. After this visit she was started on cholestyramine 4 g, one packet daily. She used this for 8 weeks and had significant improvement in her diarrhea. She reports with this medicine she was having one, sometimes 2 bowel movements a day with much more formed. There is also less borborygmi. She has been out of this medication for one week and her stools have continued to be more normal. She did not like the taste of the medication but did take it as directed. She continues to have no abdominal pain, no blood in her stool or melena. No fevers or chills. Appetite and eating have been normal for her.   Review of Systems As per history of present illness, otherwise negative  Current Medications, Allergies, Past Medical History, Past Surgical History, Family History and Social History were reviewed in [employer] record.     Objective:   Physical Exam BP 122/58  Pulse 84  Ht _0  (1.626 m) Constitutional: Well-developed and well-nourished. No distress. HEENT: Normocephalic and atraumatic. Oropharynx is clear and moist. No oropharyngeal exudate. Conjunctivae are normal.  No scleral icterus. Cardiovascular: Normal rate, regular rhythm and intact distal pulses.  Pulmonary/chest: Effort normal and  breath sounds normal. No wheezing, rales or rhonchi. Abdominal: Soft, nontender, nondistended. Bowel sounds active throughout.  Extremities: no clubbing, cyanosis, or edema Neurological: Alert and oriented to person place and time. Psychiatric: Normal mood and affect. Behavior is normal.  CBC    Component Value Date/Time   WBC 8.3 01/03/2014 1240   RBC 3.99 01/03/2014 1240   HGB 12.3 01/03/2014 1240   HCT 35.7* 01/03/2014 1240   PLT 221.0 01/03/2014 1240   MCV 89.4 01/03/2014 1240   MCHC 34.6 01/03/2014 1240   RDW 13.7 01/03/2014 1240   LYMPHSABS 1.8 01/03/2014 1240   MONOABS 0.5 01/03/2014 1240   EOSABS 0.2 01/03/2014 1240   BASOSABS 0.0 01/03/2014 1240    CMP     Component Value Date/Time   NA 139 02/26/2014 1219   K 4.0 02/26/2014 1219   CL 110 02/26/2014 1219   CO2 22 02/26/2014 1219   GLUCOSE 96 02/26/2014 1219   BUN 28* 02/26/2014 1219   CREATININE 1.7* 02/26/2014 1219   CALCIUM 9.7 02/26/2014 1219   PROT 6.1 12/04/2009 1131   ALBUMIN 3.7 12/04/2009 1131   AST 17 12/04/2009 1131   ALT 21 12/04/2009 1131   ALKPHOS 38* 12/04/2009 1131   BILITOT 0.8 12/04/2009 1131   GFRNONAA 38.95 04/03/2010 1137   GFRAA  Value: 47        The eGFR has been calculated using the MDRD equation. This calculation has not been validated in all clinical situations. eGFR's persistently <60 mL/min signify possible Chronic Kidney Disease.* 12/09/2009 1858        Assessment & Plan:  80 yo female with PMH of hypertension, hyperlipidemia, hypothyroidism, anxiety, vertigo, endometrial cancer who is seen in followup for chronic diarrhea.  1. Chronic diarrhea -- patient reports 6-8 months of chronic diarrhea with urgency of not completely clear etiology, though infectious etiology was excluded. She did not and does not wish to pursue further invasive testing for this issue. She responded well to cholestyramine, though has been off this medication for one week and the diarrhea has not yet returned. We discussed that it is possible  that she will again develop diarrhea with urgency and if this happens, she can resume a similar medicine. Given the poor taste associated with cholestyramine, I will give her a prescription for colestipol 2 g once daily. The dose can be increased if necessary. She is reminded to take this 4 hours apart from any other medication as it can impair resorption of other medications. She voices understanding. Followup was offered, but she prefers to call back and be seen as needed.

## 2014-06-25 ENCOUNTER — Encounter: Payer: Self-pay | Admitting: Internal Medicine

## 2014-06-27 ENCOUNTER — Telehealth: Payer: Self-pay | Admitting: Gynecologic Oncology

## 2014-06-27 NOTE — Telephone Encounter (Signed)
GYN ONC OFFICE NOTE:   Jessica Haynes  80 y.o. female  CC:  Chief Complaint  Patient presents with  . Abstract    HPI: Patient is  an 80 y.o.  gravida 1 para 1 who is been menopausal since her 30s. Initially seen in 2014. After endometrial biopsy on February 22, 2013 that revealed a grade 1 endometrioid adenocarcinoma. She underwent a transvaginal ultrasound on July 8. Revealed an anteverted in retroflexed uterus measuring 5.7 x 3.4 x 3.6 cm. There is an area of focal thickening of the endometrium associated in the left posterolateral portion of the endometrium measuring 1.5 cm in depth. The myometrial endometrial interface in this location was probably delineated suspicious for associated microinvasion with greater than 50% mural involvement suspected on one evaluation but no definitive spread beyond the serosa seen.  The options presented included surgery, radiation, and hormonal therapy.. If she wished to have a minimally invasive procedure evaluation by her  neurosurgeon would be important to ensure the Trendelenburg would not posterior problem with regards to her meningioma. She declined that option.  Other options presented were total abdominal hysterectomy BSO via Pfannenstiel skin incision, radiation therapy 80 and hormonal therapy both oral progesterones as well as a progesterone releasing intrauterine device.     Review of Systems  . She states that she has had a meningioma in her brain for 12-14 years and that she does not want surgery. She stopped having mammograms several years ago. She's never had a colonoscopy.   Constitutional:  Denies fever. Skin: No rash, sores, jaundice, itching, or dryness.  Cardiovascular: No chest pain, shortness of breath, or edema  Pulmonary: No cough or wheeze.  Gastro Intestinal:  No nausea, vomiting, constipation, or diarrhea reported. No bright red blood per rectum or change in bowel movement.  Genitourinary: No frequency, urgency, or dysuria.  Denies  vaginal bleeding and discharge.  Musculoskeletal: No myalgia, arthralgia, joint swelling or pain.  Neurologic: No weakness, numbness, or change in gait.  Psychology: No issues    Social Hx:   History   Social History  . Marital Status: Widowed    Spouse Name: N/A    Number of Children: 1  . Years of Education: N/A   Occupational History  . Housewife    Social History Main Topics  . Smoking status: Never Smoker   . Smokeless tobacco: Never Used  . Alcohol Use: No  . Drug Use: No  . Sexual Activity: Not on file     Comment: G1 P1, menopause in 50's   Other Topics Concern  . Not on file   Social History Narrative   Recently widowed after 70 years   She just recently sold off many household goods and had a finality to her loss.   Lives in independent living at the [facility] patient would want mechanical ventilation, dialysis artificial nutrition and hydration as long as she maintained cognitive function. She states she would not want major surgery.    Past Surgical Hx:  Past Surgical History  Procedure Laterality Date  . Mastoid surgery    . Cataract extraction Bilateral   . Uterine biopsy  06/2013  . Colonoscopy  11/2009    normal  . Tonsillectomy      Past Medical Hx:  Past Medical History  Diagnosis Date  . Skin cancer   . Meningioma   . Hypertrophy of kidney   . Irritable bladder   . Hyperlipidemia   . Hypothyroid   .  Hypertension   . Endometrial cancer 02/2013  . Deafness in right ear   . Macular degeneration   . HEPATIC CYST 05/13/2007    Qualifier: Diagnosis of  By: [place], [place]    . Anxiety   . Diverticulosis   . Shingles     Daughter with history of breast cancer at age of 33 to Oncology Hx:   No history exists.    Family Hx:  Family History  Problem Relation Age of Onset  . Breast cancer Daughter 44  . Stroke Mother   . Breast cancer Sister     1 sister w/breast  . Colon cancer Neg Hx     Vitals:  Blood  pressure 142/64, pulse 66, temperature 98.1 F (36.7 C), resp. rate 16, height 5\' 2"  (1.575 m), weight 0 lb (0 kg).  Physical Exam: Well-nourished well-developed female in no acute distress.  Neck: Supple, no lymphadenopathy, no thyromegaly.  Lungs: Clear to auscultation when he.  Cardiovascular: Regular rate rhythm.  Abdomen: Soft, nontender, nondistended. There is no palpable masses or prostatomegaly.  Extremity: No edema.  Pelvic: External genitalia within normal limits and atrophic vagina is markedly atrophic. The cervix is visualized. There's no gross visible lesions. Bimanual examination the uterus is midplane normal size shape and consistency. There is no adnexal masses.  Assessment/Plan: 80 year old with a clinical stage I grade 1 endometrioid adenocarcinoma. Ultrasound is concerning for myometrial invasion. Lost to follow up since initial consultation last year.     Janie Morning, MD

## 2014-06-28 ENCOUNTER — Ambulatory Visit: Payer: Medicare Other | Attending: Gynecologic Oncology | Admitting: Gynecologic Oncology

## 2014-06-28 ENCOUNTER — Encounter: Payer: Self-pay | Admitting: Gynecologic Oncology

## 2014-06-28 VITALS — BP 144/51 | HR 96 | Temp 97.9°F | Resp 20 | Ht 64.0 in

## 2014-06-28 DIAGNOSIS — Z85828 Personal history of other malignant neoplasm of skin: Secondary | ICD-10-CM | POA: Diagnosis not present

## 2014-06-28 DIAGNOSIS — Z803 Family history of malignant neoplasm of breast: Secondary | ICD-10-CM | POA: Insufficient documentation

## 2014-06-28 DIAGNOSIS — C541 Malignant neoplasm of endometrium: Secondary | ICD-10-CM | POA: Diagnosis present

## 2014-06-28 DIAGNOSIS — I10 Essential (primary) hypertension: Secondary | ICD-10-CM | POA: Diagnosis not present

## 2014-06-28 DIAGNOSIS — E785 Hyperlipidemia, unspecified: Secondary | ICD-10-CM | POA: Diagnosis not present

## 2014-06-28 DIAGNOSIS — Z800 Family history of malignant neoplasm of digestive organs: Secondary | ICD-10-CM | POA: Diagnosis not present

## 2014-06-28 DIAGNOSIS — E039 Hypothyroidism, unspecified: Secondary | ICD-10-CM | POA: Insufficient documentation

## 2014-06-28 NOTE — Progress Notes (Signed)
GYN ONC OFFICE NOTE:   Jessica Haynes  80 y.o. female  CC:  Chief Complaint  Patient presents with  . Endometrial Adenocarcinoma    HPI: Patient is  an 79 y.o.  gravida 1 para 1 who is been menopausal since her 73s. Initially seen in 2014. After endometrial biopsy on February 22, 2013 that revealed a grade 1 endometrioid adenocarcinoma. She underwent a transvaginal ultrasound on July 8. Revealed an anteverted in retroflexed uterus measuring 5.7 x 3.4 x 3.6 cm. There is an area of focal thickening of the endometrium associated in the left posterolateral portion of the endometrium measuring 1.5 cm in depth. The myometrial endometrial interface in this location was probably delineated suspicious for associated microinvasion with greater than 50% mural involvement suspected on one evaluation but no definitive spread beyond the serosa seen.  The options presented included surgery, radiation, and hormonal therapy.. If she wished to have a minimally invasive procedure evaluation by her  neurosurgeon would be important to ensure the Trendelenburg would not posterior problem with regards to her meningioma. She declined that option.  Other options presented were total abdominal hysterectomy BSO via Pfannenstiel skin incision, radiation therapy and hormonal therapy both oral progesterones as well as a progesterone releasing intrauterine device.   Wednesday Ericsson was seen by radiation oncology but decided not to pursue that option    Review of Systems  She states that she has had a meningioma in her brain for 12-14 years and that she does not want surgery. She stopped having mammograms several years ago. She's never had a colonoscopy. Constitutional:  Denies fever. Skin: No rash, sores, jaundice, itching, or dryness.  Cardiovascular: No chest pain, shortness of breath, or edema  Pulmonary: No cough or wheeze. No hemoptysis Gastro Intestinal:  No nausea, vomiting, constipation, reports  diarrhea when she  discontinues the anti diarrheals.. No bright red blood per rectum or change in bowel movement.  Genitourinary: No frequency, urgency, or dysuria.   Rare vaginal bleeding no discharge. No change in bladder habits  Musculoskeletal: No myalgia, arthralgia, joint swelling or pain.  Neurologic: No weakness, numbness, or change in gait. Is disdainful of using a cain because it makes her appear old.  Psychology:good mood.  Unhappy at her current residence    Social Hx:   History   Social History  . Marital Status: Widowed    Spouse Name: N/A    Number of Children: 1  . Years of Education: N/A   Occupational History  . Housewife    Social History Main Topics  . Smoking status: Never Smoker   . Smokeless tobacco: Never Used  . Alcohol Use: No  . Drug Use: No  . Sexual Activity: Not on file     Comment: G1 P1, menopause in 50's   Other Topics Concern  . Not on file   Social History Narrative   Recently widowed after 84 years   She just recently sold off many household goods and had a finality to her loss.   Lives in independent living at the [place] [facility] patient would want mechanical ventilation, dialysis artificial nutrition and hydration as long as she maintained cognitive function. She states she would not want major surgery.    Past Surgical Hx:  Past Surgical History  Procedure Laterality Date  . Mastoid surgery    . Cataract extraction Bilateral   . Uterine biopsy  06/2013  . Colonoscopy  11/2009    normal  . Tonsillectomy  Past Medical Hx:  Past Medical History  Diagnosis Date  . Skin cancer   . Meningioma   . Hypertrophy of kidney   . Irritable bladder   . Hyperlipidemia   . Hypothyroid   . Hypertension   . Endometrial cancer 02/2013  . Deafness in right ear   . Macular degeneration   . HEPATIC CYST 05/13/2007    Qualifier: Diagnosis of  By: [place], [place]    . Anxiety   . Diverticulosis   . Shingles     Daughter with history of  breast cancer at age of 9 to Oncology Hx:   No history exists.    Family Hx:  Family History  Problem Relation Age of Onset  . Breast cancer Daughter 48  . Stroke Mother   . Breast cancer Sister     1 sister w/breast  . Colon cancer Neg Hx      Physical Exam: Well-nourished well-developed female in no acute distress.  Neck: Supple, no lymphadenopathy, no thyromegaly.  Lungs: Clear to auscultation   Cardiovascular: Regular rate rhythm.  Abdomen: Soft, nontender, nondistended. There are no palpable masses, no acscites  Extremity: No edema.  Pelvic: External genitalia within normal limits and atrophic vagina is markedly atrophic. The cervix is visualized. There's no gross visible lesions. Bimanual examination the uterus is midplane normal size shape and consistency. There are  no adnexal masses.  Assessment/Plan: 80 year old with a clinical stage I grade 1 endometrioid adenocarcinoma. Ultrasound is concerning for myometrial invasion. Lost to follow up since initial consultation last year. Denies vaginal bleeding.  Remains complaint wit progesterone therapy.  No visible disease in the cervix or vagina.  Recommend continued used of oral progestins Agreed that radiotherapy will be used in the event of heavy bleeding or pain.  Cristi Gwynn declines any operative or radiotherapy intervention.     Janie Morning, MD

## 2014-06-28 NOTE — Patient Instructions (Signed)
Follow-up in 6 months Please call if there is heavy bleeding, abdominal pain   Happy holidays   Thank you very much Ms. Lenox Ponds for allowing me to provide care for you today.  I appreciate your confidence in choosing our Gynecologic Oncology team.  If you have any questions about your visit today please call our office and we will get back to you as soon as possible.  Please consider using the website Medlineplus.gov as an Geneticist, molecular.   Francetta Found. Yareni Creps MD., PhD Gynecologic Oncology

## 2014-12-04 ENCOUNTER — Other Ambulatory Visit: Payer: Self-pay | Admitting: Internal Medicine

## 2014-12-13 ENCOUNTER — Encounter: Payer: Self-pay | Admitting: Gynecologic Oncology

## 2014-12-13 ENCOUNTER — Ambulatory Visit: Payer: Medicare Other | Attending: Gynecologic Oncology | Admitting: Gynecologic Oncology

## 2014-12-13 VITALS — BP 109/52 | HR 95 | Temp 98.3°F | Resp 18 | Ht 64.0 in

## 2014-12-13 DIAGNOSIS — C541 Malignant neoplasm of endometrium: Secondary | ICD-10-CM | POA: Insufficient documentation

## 2014-12-13 MED ORDER — MEDROXYPROGESTERONE ACETATE 10 MG PO TABS: 10.0000 mg | ORAL_TABLET | Freq: Every day | ORAL | Status: DC

## 2014-12-13 NOTE — Patient Instructions (Addendum)
We will call you with the biopsy results from today.   Follow up with Dr. Skeet Latch in 6 months (October 2016). We will call you with an appointment date and time. Please call us sooner with any questions or concerns.   A prescription for Provera has been sent to [facility] to pickup.

## 2014-12-13 NOTE — Progress Notes (Signed)
GYN ONC OFFICE NOTE:   Jessica Haynes  81 y.o. female  CC:  Chief Complaint  Patient presents with  . endometrial cancer    HPI: Patient is  an 82 y.o.  gravida 1 para 1 who is been menopausal since her 30s. Initially seen in 2014. After endometrial biopsy on February 22, 2013 that revealed a grade 1 endometrioid adenocarcinoma. She underwent a transvaginal ultrasound on July 8. Revealed an anteverted in retroflexed uterus measuring 5.7 x 3.4 x 3.6 cm. There is an area of focal thickening of the endometrium associated in the left posterolateral portion of the endometrium measuring 1.5 cm in depth. The myometrial endometrial interface in this location was probably delineated suspicious for associated microinvasion with greater than 50% mural involvement suspected on one evaluation but no definitive spread beyond the serosa seen.  The options presented included surgery, radiation, and hormonal therapy.Jessica Haynes will not accept chemotherapy or operative intervention.  She remains convinced that her age and current comorbid conditions are an absolute contra-indication.    She is the youngest  of 9 children and non of her siblings lived longer than 69 years.  She does not belive that she has much time to live and is not willing to accept an intervention that may  impair her QOL. Jessica Haynes was seen by radiation oncology but decided not to pursue that option    Review of Systems  She states that she has had a meningioma in her brain for 12-14 years and that she does not want surgery. She stopped having mammograms several years ago. She's never had a colonoscopy. Constitutional:  Denies fever. Skin: No rash, sores, jaundice, itching, or dryness.  Cardiovascular: No chest pain, shortness of breath, or edema  Pulmonary: No cough or wheeze. No hemoptysis Gastro Intestinal:  No nausea, vomiting, constipation, reports  diarrhea when she discontinues the anti diarrheals.. No bright red blood per  rectum or change in bowel movement.  Genitourinary: No frequency, urgency, or dysuria.   No vaginal bleeding no discharge. No change in bladder habits  Musculoskeletal: No myalgia, arthralgia, joint swelling or pain.  Neurologic: No weakness, numbness, or change in gait. Is disdainful of using a cane because it makes her appear old.  Psychology:good mood.     Social Hx:   History   Social History  . Marital Status: Widowed    Spouse Name: N/A  . Number of Children: 1  . Years of Education: N/A   Occupational History  . Housewife    Social History Main Topics  . Smoking status: Never Smoker   . Smokeless tobacco: Never Used  . Alcohol Use: No  . Drug Use: No  . Sexual Activity: Not on file     Comment: G1 P1, menopause in 50's   Other Topics Concern  . Not on file   Social History Narrative   Recently widowed after 79 years   She just recently sold off many household goods and had a finality to her loss.   Lives in independent living at the [place] [facility] patient would want mechanical ventilation, dialysis artificial nutrition and hydration as long as she maintained cognitive function. She states she would not want major surgery.  Moved out of a retirement community into an appartment  Past Surgical Hx:  Past Surgical History  Procedure Laterality Date  . Mastoid surgery    . Cataract extraction Bilateral   . Uterine biopsy  06/2013  . Colonoscopy  11/2009  normal  . Tonsillectomy      Past Medical Hx:  Past Medical History  Diagnosis Date  . Skin cancer   . Meningioma   . Hypertrophy of kidney   . Irritable bladder   . Hyperlipidemia   . Hypothyroid   . Hypertension   . Endometrial cancer 02/2013  . Deafness in right ear   . Macular degeneration   . HEPATIC CYST 05/13/2007    Qualifier: Diagnosis of  By: [place], [place]    . Anxiety   . Diverticulosis   . Shingles     Daughter with history of breast cancer at age of 8  Oncology Hx:    No history exists.    Family Hx:  Family History  Problem Relation Age of Onset  . Breast cancer Daughter 61  . Stroke Mother   . Breast cancer Sister     1 sister w/breast  . Colon cancer Neg Hx      Physical Exam: BP 109/52 mmHg  Pulse 95  Temp(Src) 98.3 F (36.8 C) (Oral)  Resp 18  Ht 5\' 4"  (1.626 m)  Wt  DECLINES WEIGHT Well-nourished well-developed female in no acute distress.  Neck: Supple, no lymphadenopathy, no thyromegaly.  Lungs: Clear to auscultation   Cardiovascular: Regular rate rhythm.  Abdomen: Soft, nontender, nondistended. There are no palpable masses, no acscites  Extremity: No edema.  Pelvic: External genitalia within normal limits and atrophic vagina is markedly atrophic. The cervix is visualized. There's no gross visible lesions. Bimanual examination the uterus is midplane normal size shape and consistency. There are  no adnexal masses. Uterus sounded to 6 cm .  Scant material obtained on EMB  Assessment/Plan: 81 y.o.  with a clinical stage I grade 1 endometrioid adenocarcinoma. Ultrasound is concerning for myometrial invasion. Lost to follow up since initial consultation last year. Denies vaginal bleeding.  Remains complaint with progesterone therapy.  No visible disease in the cervix or vagina.  Recommend continued used of oral progestins.   If EMB collected today  shows a higher grade Ms Jessica Haynes will consider IUD or increased progestin dose Agreed that radiotherapy will be used in the event of heavy bleeding or pain.  Jessica Haynes declines any operative or radiotherapy intervention. Declines chemotherapy     Jessica Morning, MD

## 2014-12-19 ENCOUNTER — Telehealth: Payer: Self-pay | Admitting: *Deleted

## 2014-12-19 NOTE — Telephone Encounter (Signed)
Patient returned call. Let her know that the biopsy showed the same grade I endometrial cancer and that Dr. Skeet Latch would like her to continue taking Provera daily. While on the phone, patient confirmed that she picked up the Provera at [facility] last week and started taking it. Patient given followup appt with Dr. Skeet Latch on 06/06/15 at 10:45AM. Told patient that if she develops vaginal bleeding or any other symptoms prior to this visit to please call our office - patient agreeable to this.

## 2014-12-19 NOTE — Telephone Encounter (Signed)
Left VM requesting return call from patient.

## 2015-03-07 ENCOUNTER — Other Ambulatory Visit: Payer: Self-pay | Admitting: Internal Medicine

## 2015-06-05 ENCOUNTER — Telehealth: Payer: Self-pay | Admitting: Gynecologic Oncology

## 2015-06-05 NOTE — Telephone Encounter (Signed)
GYN ONC OFFICE NOTE:   Jessica Haynes  81 y.o. female  CC:  Chief Complaint  Patient presents with  . Abstract    HPI: Patient is  an 81 y.o.  gravida 1 para 1 who is been menopausal since her 45s. Initially seen in 2014. After endometrial biopsy on February 22, 2013 that revealed a grade 1 endometrioid adenocarcinoma. She underwent a transvaginal ultrasound on July 8. Revealed an anteverted in retroflexed uterus measuring 5.7 x 3.4 x 3.6 cm. There is an area of focal thickening of the endometrium associated in the left posterolateral portion of the endometrium measuring 1.5 cm in depth. The myometrial endometrial interface in this location was probably delineated suspicious for associated microinvasion with greater than 50% mural involvement suspected on one evaluation but no definitive spread beyond the serosa seen.  The options presented included surgery, radiation, and hormonal therapy.Matteson Blue will not accept chemotherapy or operative intervention.  She remains convinced that her age and current comorbid conditions are an absolute contra-indication.    She is the youngest  of 9 children and non of her siblings lived longer than 71 years.  She does not belive that she has much time to live and is not willing to accept an intervention that may  impair her QOL. Kamaile Zachow was seen by radiation oncology but decided not to pursue that option.  Currently on oral progestin.  EMB  11/2014 Endometrium, biopsy - ENDOMETRIOID ADENOCARCINOMA, FIGO GRADE I. - FRAGMENT OF ENDOCERVICAL TYPE POLYP.  Review of Systems  She states that she has had a meningioma in her brain for 12-14 years and that she does not want surgery. She stopped having mammograms several years ago. She's never had a colonoscopy. Constitutional:  Denies fever. Skin: No rash, sores, jaundice, itching, or dryness.  Cardiovascular: No chest pain, shortness of breath, or edema  Pulmonary: No cough or wheeze. No  hemoptysis Gastro Intestinal:  No nausea, vomiting, constipation, reports  diarrhea when she discontinues the anti diarrheals.. No bright red blood per rectum or change in bowel movement.  Genitourinary: No frequency, urgency, or dysuria.   No vaginal bleeding no discharge. No change in bladder habits  Musculoskeletal: No myalgia, arthralgia, joint swelling or pain.  Neurologic: No weakness, numbness, or change in gait. Is disdainful of using a cane because it makes her appear old.  Psychology:good mood.     Social Hx:   Social History   Social History  . Marital Status: Widowed    Spouse Name: N/A  . Number of Children: 1  . Years of Education: N/A   Occupational History  . Housewife    Social History Main Topics  . Smoking status: Never Smoker   . Smokeless tobacco: Never Used  . Alcohol Use: No  . Drug Use: No  . Sexual Activity: Not on file     Comment: G1 P1, menopause in 50's   Other Topics Concern  . Not on file   Social History Narrative   Recently widowed after 58 years   She just recently sold off many household goods and had a finality to her loss.   Lives in independent living at the [facility] patient would want mechanical ventilation, dialysis artificial nutrition and hydration as long as she maintained cognitive function. She states she would not want major surgery.  Moved out of a retirement community into an appartment  Past Surgical Hx:  Past Surgical History  Procedure Laterality Date  .  Mastoid surgery    . Cataract extraction Bilateral   . Uterine biopsy  06/2013  . Colonoscopy  11/2009    normal  . Tonsillectomy      Past Medical Hx:  Past Medical History  Diagnosis Date  . Skin cancer   . Meningioma   . Hypertrophy of kidney   . Irritable bladder   . Hyperlipidemia   . Hypothyroid   . Hypertension   . Endometrial cancer 02/2013  . Deafness in right ear   . Macular degeneration   . HEPATIC CYST 05/13/2007    Qualifier:  Diagnosis of  By: [place], [place]    . Anxiety   . Diverticulosis   . Shingles     Daughter with history of breast cancer at age of 17  Oncology Hx:   No history exists.    Family Hx:  Family History  Problem Relation Age of Onset  . Breast cancer Daughter 46  . Stroke Mother   . Breast cancer Sister     1 sister w/breast  . Colon cancer Neg Hx      Physical Exam: BP 109/52 mmHg  Pulse 95  Temp(Src) 98.3 F (36.8 C) (Oral)  Resp 18  Ht 5\' 4"  (1.626 m)  Wt  DECLINES WEIGHT Well-nourished well-developed female in no acute distress.  Neck: Supple, no lymphadenopathy, no thyromegaly.  Lungs: Clear to auscultation   Cardiovascular: Regular rate rhythm.  Abdomen: Soft, nontender, nondistended. There are no palpable masses, no acscites  Extremity: No edema.  Pelvic: External genitalia within normal limits and atrophic vagina is markedly atrophic. The cervix is visualized. There's no gross visible lesions. Bimanual examination the uterus is midplane normal size shape and consistency. There are  no adnexal masses. Uterus sounded to 6 cm .  Scant material obtained on EMB  Assessment/Plan: 81 y.o.  with a clinical stage I grade 1 endometrioid adenocarcinoma. Ultrasound is concerning for myometrial invasion. Lost to follow up since initial consultation last year. Denies vaginal bleeding.  Remains complaint with progesterone therapy.  No visible disease in the cervix or vagina.  Recommend continued used of oral progestins.   Agreed that radiotherapy will be used in the event of heavy bleeding or pain.  Azuree Minish declines any operative or radiotherapy intervention. Declines chemotherapy     Janie Morning, MD

## 2015-06-06 ENCOUNTER — Ambulatory Visit: Payer: Medicare Other | Admitting: Gynecologic Oncology

## 2015-06-06 ENCOUNTER — Telehealth: Payer: Self-pay

## 2015-06-06 NOTE — Telephone Encounter (Signed)
Called patient to reschedule missed appt with Dr. Skeet Latch today. Left VM requesting return call from patient in regards to this.

## 2015-06-13 ENCOUNTER — Ambulatory Visit: Payer: Medicare Other | Admitting: Gynecologic Oncology

## 2015-12-02 DIAGNOSIS — E039 Hypothyroidism, unspecified: Secondary | ICD-10-CM | POA: Diagnosis not present

## 2015-12-02 DIAGNOSIS — Z111 Encounter for screening for respiratory tuberculosis: Secondary | ICD-10-CM | POA: Diagnosis not present

## 2015-12-02 DIAGNOSIS — C541 Malignant neoplasm of endometrium: Secondary | ICD-10-CM | POA: Diagnosis not present

## 2015-12-02 DIAGNOSIS — I10 Essential (primary) hypertension: Secondary | ICD-10-CM | POA: Diagnosis not present

## 2015-12-02 DIAGNOSIS — N184 Chronic kidney disease, stage 4 (severe): Secondary | ICD-10-CM | POA: Diagnosis not present

## 2015-12-31 ENCOUNTER — Other Ambulatory Visit: Payer: Self-pay | Admitting: Gynecologic Oncology

## 2016-03-12 DIAGNOSIS — H02132 Senile ectropion of right lower eyelid: Secondary | ICD-10-CM | POA: Diagnosis not present

## 2016-03-12 DIAGNOSIS — Z961 Presence of intraocular lens: Secondary | ICD-10-CM | POA: Diagnosis not present

## 2016-04-22 DIAGNOSIS — E039 Hypothyroidism, unspecified: Secondary | ICD-10-CM | POA: Diagnosis not present

## 2016-04-22 DIAGNOSIS — I10 Essential (primary) hypertension: Secondary | ICD-10-CM | POA: Diagnosis not present

## 2016-04-22 DIAGNOSIS — F3341 Major depressive disorder, recurrent, in partial remission: Secondary | ICD-10-CM | POA: Diagnosis not present

## 2016-04-22 DIAGNOSIS — Z0000 Encounter for general adult medical examination without abnormal findings: Secondary | ICD-10-CM | POA: Diagnosis not present

## 2016-04-22 DIAGNOSIS — Z23 Encounter for immunization: Secondary | ICD-10-CM | POA: Diagnosis not present

## 2016-04-22 DIAGNOSIS — C541 Malignant neoplasm of endometrium: Secondary | ICD-10-CM | POA: Diagnosis not present

## 2016-04-22 DIAGNOSIS — N184 Chronic kidney disease, stage 4 (severe): Secondary | ICD-10-CM | POA: Diagnosis not present

## 2016-04-22 DIAGNOSIS — D329 Benign neoplasm of meninges, unspecified: Secondary | ICD-10-CM | POA: Diagnosis not present

## 2016-04-22 DIAGNOSIS — Q600 Renal agenesis, unilateral: Secondary | ICD-10-CM | POA: Diagnosis not present

## 2016-05-20 DIAGNOSIS — H02103 Unspecified ectropion of right eye, unspecified eyelid: Secondary | ICD-10-CM | POA: Diagnosis not present

## 2016-05-20 DIAGNOSIS — H353222 Exudative age-related macular degeneration, left eye, with inactive choroidal neovascularization: Secondary | ICD-10-CM | POA: Diagnosis not present

## 2016-05-25 DIAGNOSIS — H02201 Unspecified lagophthalmos right upper eyelid: Secondary | ICD-10-CM | POA: Diagnosis not present

## 2016-07-27 DIAGNOSIS — L574 Cutis laxa senilis: Secondary | ICD-10-CM | POA: Diagnosis not present

## 2016-07-27 DIAGNOSIS — H02201 Unspecified lagophthalmos right upper eyelid: Secondary | ICD-10-CM | POA: Diagnosis not present

## 2016-07-27 DIAGNOSIS — H02103 Unspecified ectropion of right eye, unspecified eyelid: Secondary | ICD-10-CM | POA: Diagnosis not present

## 2016-07-27 DIAGNOSIS — H02102 Unspecified ectropion of right lower eyelid: Secondary | ICD-10-CM | POA: Diagnosis not present

## 2016-07-27 DIAGNOSIS — H02834 Dermatochalasis of left upper eyelid: Secondary | ICD-10-CM | POA: Diagnosis not present

## 2016-10-16 DIAGNOSIS — H353222 Exudative age-related macular degeneration, left eye, with inactive choroidal neovascularization: Secondary | ICD-10-CM | POA: Diagnosis not present

## 2016-11-09 DIAGNOSIS — H04123 Dry eye syndrome of bilateral lacrimal glands: Secondary | ICD-10-CM | POA: Diagnosis not present

## 2016-11-30 DIAGNOSIS — H02201 Unspecified lagophthalmos right upper eyelid: Secondary | ICD-10-CM | POA: Diagnosis not present

## 2016-11-30 DIAGNOSIS — H04123 Dry eye syndrome of bilateral lacrimal glands: Secondary | ICD-10-CM | POA: Diagnosis not present

## 2016-12-29 ENCOUNTER — Other Ambulatory Visit: Payer: Self-pay | Admitting: Gynecologic Oncology

## 2017-01-14 DIAGNOSIS — H26492 Other secondary cataract, left eye: Secondary | ICD-10-CM | POA: Diagnosis not present

## 2017-01-15 DIAGNOSIS — M79605 Pain in left leg: Secondary | ICD-10-CM | POA: Diagnosis not present

## 2017-05-11 DIAGNOSIS — L570 Actinic keratosis: Secondary | ICD-10-CM | POA: Diagnosis not present

## 2017-05-11 DIAGNOSIS — L820 Inflamed seborrheic keratosis: Secondary | ICD-10-CM | POA: Diagnosis not present

## 2017-05-19 DIAGNOSIS — F3341 Major depressive disorder, recurrent, in partial remission: Secondary | ICD-10-CM | POA: Diagnosis not present

## 2017-05-19 DIAGNOSIS — E039 Hypothyroidism, unspecified: Secondary | ICD-10-CM | POA: Diagnosis not present

## 2017-05-19 DIAGNOSIS — N184 Chronic kidney disease, stage 4 (severe): Secondary | ICD-10-CM | POA: Diagnosis not present

## 2017-05-19 DIAGNOSIS — Z23 Encounter for immunization: Secondary | ICD-10-CM | POA: Diagnosis not present

## 2017-05-19 DIAGNOSIS — Z0000 Encounter for general adult medical examination without abnormal findings: Secondary | ICD-10-CM | POA: Diagnosis not present

## 2017-05-19 DIAGNOSIS — I10 Essential (primary) hypertension: Secondary | ICD-10-CM | POA: Diagnosis not present

## 2017-05-19 DIAGNOSIS — K529 Noninfective gastroenteritis and colitis, unspecified: Secondary | ICD-10-CM | POA: Diagnosis not present

## 2017-06-02 DIAGNOSIS — H02103 Unspecified ectropion of right eye, unspecified eyelid: Secondary | ICD-10-CM | POA: Diagnosis not present

## 2017-12-01 DIAGNOSIS — H353222 Exudative age-related macular degeneration, left eye, with inactive choroidal neovascularization: Secondary | ICD-10-CM | POA: Diagnosis not present

## 2017-12-09 ENCOUNTER — Emergency Department (HOSPITAL_COMMUNITY)
Admission: EM | Admit: 2017-12-09 | Discharge: 2017-12-09 | Discharge status: Against Medical Advice | Payer: PPO | Attending: Emergency Medicine | Admitting: Emergency Medicine

## 2017-12-09 ENCOUNTER — Encounter (HOSPITAL_COMMUNITY): Payer: Self-pay | Admitting: *Deleted

## 2017-12-09 ENCOUNTER — Other Ambulatory Visit: Payer: Self-pay

## 2017-12-09 ENCOUNTER — Encounter: Payer: Self-pay | Admitting: Emergency Medicine

## 2017-12-09 ENCOUNTER — Ambulatory Visit (INDEPENDENT_AMBULATORY_CARE_PROVIDER_SITE_OTHER): Payer: PPO | Admitting: Emergency Medicine

## 2017-12-09 VITALS — BP 101/64 | HR 109 | Temp 97.7°F | Resp 16 | Ht 63.0 in | Wt 176.4 lb

## 2017-12-09 DIAGNOSIS — Z5321 Procedure and treatment not carried out due to patient leaving prior to being seen by health care provider: Secondary | ICD-10-CM | POA: Diagnosis not present

## 2017-12-09 DIAGNOSIS — R1032 Left lower quadrant pain: Secondary | ICD-10-CM | POA: Diagnosis not present

## 2017-12-09 DIAGNOSIS — Z8542 Personal history of malignant neoplasm of other parts of uterus: Secondary | ICD-10-CM

## 2017-12-09 LAB — POCT URINALYSIS DIP (MANUAL ENTRY)
Glucose, UA: NEGATIVE mg/dL
NITRITE UA: NEGATIVE
Protein Ur, POC: 100 mg/dL — AB
RBC UA: NEGATIVE
Spec Grav, UA: >=1.03 — AB (ref 1.010–1.025)
Urobilinogen, UA: 0.2 E.U./dL
pH, UA: 5 (ref 5.0–8.0)

## 2017-12-09 LAB — COMPREHENSIVE METABOLIC PANEL
ALT: 11 U/L — AB (ref 14–54)
ANION GAP: 11 (ref 5–15)
AST: 13 U/L — ABNORMAL LOW (ref 15–41)
Albumin: 4.4 g/dL (ref 3.5–5.0)
Alkaline Phosphatase: 66 U/L (ref 38–126)
BUN: 43 mg/dL — ABNORMAL HIGH (ref 6–20)
CALCIUM: 9.6 mg/dL (ref 8.9–10.3)
CHLORIDE: 111 mmol/L (ref 101–111)
CO2: 22 mmol/L (ref 22–32)
CREATININE: 2.23 mg/dL — AB (ref 0.44–1.00)
GFR, EST AFRICAN AMERICAN: 21 mL/min — AB (ref >60)
GFR, EST NON AFRICAN AMERICAN: 18 mL/min — AB (ref >60)
Glucose, Bld: 101 mg/dL — ABNORMAL HIGH (ref 65–99)
Potassium: 4.5 mmol/L (ref 3.5–5.1)
Sodium: 144 mmol/L (ref 135–145)
Total Bilirubin: 0.8 mg/dL (ref 0.3–1.2)
Total Protein: 7.5 g/dL (ref 6.5–8.1)

## 2017-12-09 LAB — CBC
HCT: 36.3 % (ref 36.0–46.0)
HEMOGLOBIN: 12 g/dL (ref 12.0–15.0)
MCH: 31 pg (ref 26.0–34.0)
MCHC: 33.1 g/dL (ref 30.0–36.0)
MCV: 93.8 fL (ref 78.0–100.0)
Platelets: 206 10*3/uL (ref 150–400)
RBC: 3.87 MIL/uL (ref 3.87–5.11)
RDW: 13 % (ref 11.5–15.5)
WBC: 8.5 10*3/uL (ref 4.0–10.5)

## 2017-12-09 LAB — LIPASE, BLOOD: Lipase: 50 U/L (ref 11–51)

## 2017-12-09 NOTE — Patient Instructions (Addendum)
To ED now for further evaluation.  Abdominal Pain, Adult Many things can cause belly (abdominal) pain. Most times, belly pain is not dangerous. Many cases of belly pain can be watched and treated at home. Sometimes belly pain is serious, though. Your doctor will try to find the cause of your belly pain. Follow these instructions at home:  Take over-the-counter and prescription medicines only as told by your doctor. Do not take medicines that help you poop (laxatives) unless told to by your doctor.  Drink enough fluid to keep your pee (urine) clear or pale yellow.  Watch your belly pain for any changes.  Keep all follow-up visits as told by your doctor. This is important. Contact a doctor if:  Your belly pain changes or gets worse.  You are not hungry, or you lose weight without trying.  You are having trouble pooping (constipated) or have watery poop (diarrhea) for more than 2-3 days.  You have pain when you pee or poop.  Your belly pain wakes you up at night.  Your pain gets worse with meals, after eating, or with certain foods.  You are throwing up and cannot keep anything down.  You have a fever. Get help right away if:  Your pain does not go away as soon as your doctor says it should.  You cannot stop throwing up.  Your pain is only in areas of your belly, such as the right side or the left lower part of the belly.  You have bloody or black poop, or poop that looks like tar.  You have very bad pain, cramping, or bloating in your belly.  You have signs of not having enough fluid or water in your body (dehydration), such as: ? Dark pee, very little pee, or no pee. ? Cracked lips. ? Dry mouth. ? Sunken eyes. ? Sleepiness. ? Weakness. This information is not intended to replace advice given to you by your health care provider. Make sure you discuss any questions you have with your health care provider. Document Released: 01/27/2008 Document Revised: 02/28/2016  Document Reviewed: 01/22/2016 Elsevier Interactive Patient Education  2018 [employer].     IF you received an x-ray today, you will receive an invoice from [facility]. Please contact [facility] at [phone] with questions or concerns regarding your invoice.   IF you received labwork today, you will receive an invoice from [place] [facility]. Please contact [facility] at [phone] with questions or concerns regarding your invoice.   Our billing staff will not be able to assist you with questions regarding bills from these companies.  You will be contacted with the lab results as soon as they are available. The fastest way to get your results is to activate your My Chart account. Instructions are located on the last page of this paperwork. If you have not heard from [place] regarding the results in 2 weeks, please contact this office.

## 2017-12-09 NOTE — Progress Notes (Addendum)
Jessica Haynes 84 y.o.   Chief Complaint  Patient presents with  . Abdominal Pain    x 2 days    HISTORY OF PRESENT ILLNESS: This is a 84 y.o. female complaining of left lower abdominal pain steady for 2 days.  Denies fever or chills.  Decreased appetite.  Denies nausea or vomiting.  Has history of both diverticulosis and endometrial cancer.  HPI   Prior to Admission medications   Medication Sig Start Date End Date Taking? Authorizing Provider  aspirin 81 MG tablet Take 81 mg by mouth 3 (three) times a week.   Yes [provider]  colestipol (COLESTID) 1 G tablet TAKE 2 TABLETS DAILY FOR DIARRHEA. TAKE 4 HOURS SEPARATE FROM OTHER MEDICATIONS. 03/07/15  Yes Pyrtle, Lajuan Lines, MD  hydrochlorothiazide (HYDRODIURIL) 25 MG tablet TAKE 1 TABLET ONCE DAILY. 10/30/13  Yes Norins, Heinz Knuckles, MD  levothyroxine (SYNTHROID, LEVOTHROID) 100 MCG tablet TAKE 1 TABLET ONCE DAILY. 10/30/13  Yes Norins, Heinz Knuckles, MD  lisinopril (PRINIVIL,ZESTRIL) 20 MG tablet TAKE 1 TABLET ONCE DAILY. 10/30/13  Yes Norins, Heinz Knuckles, MD  medroxyPROGESTERone (PROVERA) 10 MG tablet TAKE 1 TABLET ONCE DAILY. 12/31/15  Yes Cross, Carollee Massed, NP  OVER THE COUNTER MEDICATION    Yes [provider]  OVER THE COUNTER MEDICATION daily.   Yes [provider]  sertraline (ZOLOFT) 100 MG tablet TAKE 1 TABLET ONCE DAILY. 06/07/13  Yes Norins, Heinz Knuckles, MD  AMBULATORY NON FORMULARY MEDICATION Macular Dege Tabs Take 1 tablet by mouth twice a day    [provider]  mupirocin ointment (BACTROBAN) 2 % Apply 1 application topically daily.    [provider]    Allergies  Allergen Reactions  . Clarithromycin     REACTION: causes swelling  . Neomycin     REACTION: causes itching and redness  . Penicillins     REACTION: causes swelling    Patient Active Problem List   Diagnosis Date Noted  . Diarrhea 04/06/2014  . Deafness in right ear   . Endometrial cancer ([place]) 03/01/2013  . Macular  degeneration 06/08/2011  . BELL'S PALSY 11/25/2009  . VITAMIN B12 DEFICIENCY 11/10/2007  . MENINGIOMA 05/13/2007  . HYPOTHYROIDISM 05/13/2007  . HYPERLIPIDEMIA 05/13/2007  . HYPERTENSION 05/13/2007  . Other specified disorders of bladder 05/13/2007    Past Medical History:  Diagnosis Date  . Anxiety   . Deafness in right ear   . Diverticulosis   . Endometrial cancer ([place]) 02/2013  . HEPATIC CYST 05/13/2007   Qualifier: Diagnosis of  By: [place], [place]    . Hyperlipidemia   . Hypertension   . Hypertrophy of kidney   . Hypothyroid   . Irritable bladder   . Macular degeneration   . Meningioma ([place])   . Shingles   . Skin cancer     Past Surgical History:  Procedure Laterality Date  . CATARACT EXTRACTION Bilateral   . COLONOSCOPY  11/2009   normal  . mastoid surgery    . TONSILLECTOMY    . uterine biopsy  06/2013    Social History   Socioeconomic History  . Marital status: Widowed    Spouse name: Not on file  . Number of children: 1  . Years of education: Not on file  . Highest education level: Not on file  Occupational History  . Occupation: Housewife  Social Needs  . Financial resource strain: Not on file  . Food insecurity:    Worry: Not on file  Inability: Not on file  . Transportation needs:    Medical: Not on file    Non-medical: Not on file  Tobacco Use  . Smoking status: Never Smoker  . Smokeless tobacco: Never Used  Substance and Sexual Activity  . Alcohol use: No  . Drug use: No  . Sexual activity: Not on file    Comment: G1 P1, menopause in 50's  Lifestyle  . Physical activity:    Days per week: Not on file    Minutes per session: Not on file  . Stress: Not on file  Relationships  . Social connections:    Talks on phone: Not on file    Gets together: Not on file    Attends religious service: Not on file    Active member of club or organization: Not on file    Attends meetings of clubs or organizations: Not on file    Relationship  status: Not on file  . Intimate partner violence:    Fear of current or ex partner: Not on file    Emotionally abused: Not on file    Physically abused: Not on file    Forced sexual activity: Not on file  Other Topics Concern  . Not on file  Social History Narrative   Recently widowed after 18 years   She just recently sold off many household goods and had a finality to her loss.   Lives in independent living at the [facility] patient would want mechanical ventilation, dialysis artificial nutrition and hydration as long as she maintained cognitive function. She states she would not want major surgery.    Family History  Problem Relation Age of Onset  . Breast cancer Daughter 26  . Stroke Mother   . Breast cancer Sister        1 sister w/breast  . Colon cancer Neg Hx      Review of Systems  Constitutional: Negative.  Negative for fever.  HENT: Negative.  Negative for sore throat.   Eyes: Negative.   Respiratory: Negative.  Negative for cough and shortness of breath.   Cardiovascular: Negative.  Negative for chest pain and palpitations.  Gastrointestinal: Positive for abdominal pain. Negative for blood in stool, melena, nausea and vomiting.  Genitourinary: Negative.  Negative for hematuria.  Musculoskeletal: Negative for back pain, myalgias and neck pain.  Skin: Negative.  Negative for rash.  Neurological: Negative.  Negative for dizziness, sensory change, focal weakness and headaches.  Endo/Heme/Allergies: Negative.   All other systems reviewed and are negative.  Vitals:   12/09/17 0857  BP: 101/64  Pulse: (!) 109  Resp: 16  Temp: 97.7 F (36.5 C)  SpO2: 97%     Physical Exam  Constitutional: She appears well-developed and well-nourished.  HENT:  Head: Normocephalic and atraumatic.  Nose: Nose normal.  Mouth/Throat: Oropharynx is clear and moist.  Eyes: Pupils are equal, round, and reactive to light. EOM are normal.  Neck: Normal range of  motion. Neck supple.  Cardiovascular: Regular rhythm. Tachycardia present.  Pulmonary/Chest: Effort normal and breath sounds normal.  Abdominal: Normal appearance and bowel sounds are normal. She exhibits no distension, no ascites, no pulsatile midline mass and no mass. There is tenderness in the left lower quadrant. There is no rebound and no guarding.  Musculoskeletal: Normal range of motion.  Neurological: A cranial nerve deficit (right sided facial paralysis) is present. No sensory deficit. She exhibits normal muscle tone.  Skin: Skin is warm and  dry. Capillary refill takes less than 2 seconds.  Psychiatric: She has a normal mood and affect. Her behavior is normal.  Vitals reviewed.  A total of 25 minutes was spent in the room with the patient, greater than 50% of which was in counseling/coordination of care.  Results for orders placed or performed in visit on 12/09/17 (from the past 24 hour(s))  POCT urinalysis dipstick     Status: Abnormal   Collection Time: 12/09/17  9:40 AM  Result Value Ref Range   Color, UA yellow yellow   Clarity, UA clear clear   Glucose, UA negative negative mg/dL   Bilirubin, UA small (A) negative   Ketones, POC UA small (15) (A) negative mg/dL   Spec Grav, UA >=1.030 (A) 1.010 - 1.025   Blood, UA negative negative   pH, UA 5.0 5.0 - 8.0   Protein Ur, POC =100 (A) negative mg/dL   Urobilinogen, UA 0.2 0.2 or 1.0 E.U./dL   Nitrite, UA Negative Negative   Leukocytes, UA Small (1+) (A) Negative    ASSESSMENT & PLAN: Needs further workup and evaluation in the ED.  Benita was seen today for abdominal pain.  Diagnoses and all orders for this visit:  Abdominal pain, left lower quadrant Comments: Suspected diverticulitis Orders: -     POCT urinalysis dipstick  History of endometrial cancer     Patient Instructions   To ED now for further evaluation.  Abdominal Pain, Adult Many things can cause belly (abdominal) pain. Most times, belly pain  is not dangerous. Many cases of belly pain can be watched and treated at home. Sometimes belly pain is serious, though. Your doctor will try to find the cause of your belly pain. Follow these instructions at home:  Take over-the-counter and prescription medicines only as told by your doctor. Do not take medicines that help you poop (laxatives) unless told to by your doctor.  Drink enough fluid to keep your pee (urine) clear or pale yellow.  Watch your belly pain for any changes.  Keep all follow-up visits as told by your doctor. This is important. Contact a doctor if:  Your belly pain changes or gets worse.  You are not hungry, or you lose weight without trying.  You are having trouble pooping (constipated) or have watery poop (diarrhea) for more than 2-3 days.  You have pain when you pee or poop.  Your belly pain wakes you up at night.  Your pain gets worse with meals, after eating, or with certain foods.  You are throwing up and cannot keep anything down.  You have a fever. Get help right away if:  Your pain does not go away as soon as your doctor says it should.  You cannot stop throwing up.  Your pain is only in areas of your belly, such as the right side or the left lower part of the belly.  You have bloody or black poop, or poop that looks like tar.  You have very bad pain, cramping, or bloating in your belly.  You have signs of not having enough fluid or water in your body (dehydration), such as: ? Dark pee, very little pee, or no pee. ? Cracked lips. ? Dry mouth. ? Sunken eyes. ? Sleepiness. ? Weakness. This information is not intended to replace advice given to you by your health care provider. Make sure you discuss any questions you have with your health care provider. Document Released: 01/27/2008 Document Revised: 02/28/2016 Document Reviewed: 01/22/2016 Elsevier Interactive  Patient Education  [employer].     IF you received an x-ray today, you  will receive an invoice from [facility]. Please contact [facility] at [phone] with questions or concerns regarding your invoice.   IF you received labwork today, you will receive an invoice from [place]. Please contact [facility] at [phone] with questions or concerns regarding your invoice.   Our billing staff will not be able to assist you with questions regarding bills from these companies.  You will be contacted with the lab results as soon as they are available. The fastest way to get your results is to activate your My Chart account. Instructions are located on the last page of this paperwork. If you have not heard from [place] regarding the results in 2 weeks, please contact this office.       Agustina Caroli, MD [facility]

## 2017-12-09 NOTE — ED Triage Notes (Signed)
Pt complains of left lower abdominal pain for the past 24-48 hours. Pt went to PCP, who sent pt here for further evaluation. Pt states she has chronic diarrhea and has not noticed change in bowel pattern.

## 2017-12-31 ENCOUNTER — Other Ambulatory Visit: Payer: Self-pay

## 2017-12-31 ENCOUNTER — Encounter: Payer: Self-pay | Admitting: Urgent Care

## 2017-12-31 ENCOUNTER — Ambulatory Visit (INDEPENDENT_AMBULATORY_CARE_PROVIDER_SITE_OTHER): Payer: PPO

## 2017-12-31 ENCOUNTER — Ambulatory Visit (INDEPENDENT_AMBULATORY_CARE_PROVIDER_SITE_OTHER): Payer: PPO | Admitting: Urgent Care

## 2017-12-31 VITALS — BP 130/70 | HR 106 | Temp 98.0°F | Resp 16 | Ht 63.0 in | Wt 181.0 lb

## 2017-12-31 DIAGNOSIS — M79641 Pain in right hand: Secondary | ICD-10-CM

## 2017-12-31 DIAGNOSIS — M7989 Other specified soft tissue disorders: Secondary | ICD-10-CM

## 2017-12-31 DIAGNOSIS — T148XXA Other injury of unspecified body region, initial encounter: Secondary | ICD-10-CM

## 2017-12-31 DIAGNOSIS — Z23 Encounter for immunization: Secondary | ICD-10-CM | POA: Diagnosis not present

## 2017-12-31 DIAGNOSIS — L089 Local infection of the skin and subcutaneous tissue, unspecified: Secondary | ICD-10-CM | POA: Diagnosis not present

## 2017-12-31 DIAGNOSIS — S61451A Open bite of right hand, initial encounter: Secondary | ICD-10-CM | POA: Diagnosis not present

## 2017-12-31 LAB — POCT CBC
GRANULOCYTE PERCENT: 75.8 % (ref 37–80)
HCT, POC: 38 % (ref 37.7–47.9)
Hemoglobin: 12.9 g/dL (ref 12.2–16.2)
Lymph, poc: 1.5 (ref 0.6–3.4)
MCH: 30.9 pg (ref 27–31.2)
MCHC: 33.9 g/dL (ref 31.8–35.4)
MCV: 91 fL (ref 80–97)
MID (CBC): 0.3 (ref 0–0.9)
MPV: 8.2 fL (ref 0–99.8)
PLATELET COUNT, POC: 283 10*3/uL (ref 142–424)
POC Granulocyte: 5.7 (ref 2–6.9)
POC LYMPH PERCENT: 20.2 %L (ref 10–50)
POC MID %: 4 % (ref 0–12)
RBC: 4.17 M/uL (ref 4.04–5.48)
RDW, POC: 12.9 %
WBC: 7.5 10*3/uL (ref 4.6–10.2)

## 2017-12-31 MED ORDER — DOXYCYCLINE HYCLATE 100 MG PO CAPS: 100.0000 mg | ORAL_CAPSULE | Freq: Two times a day (BID) | ORAL | 0 refills | Status: DC

## 2017-12-31 MED ORDER — CEFTRIAXONE SODIUM 1 G IJ SOLR
1.0000 g | Freq: Once | INTRAMUSCULAR | Status: AC
  Administered 2017-12-31: 1 g via INTRAMUSCULAR

## 2017-12-31 NOTE — Progress Notes (Signed)
MRN: [id number] DOB: 08/08/1923  Subjective:   Jessica Haynes is a 84 y.o. female presenting for suffering a dog bite from her 10 pound poodle to her right hand.  Patient reports that her hand feels off including swelling and pain around her wounds.  Nuys fever, headache, confusion, dizziness, chest pain, nausea, vomiting, tenderness of her upper arm, red streaks along her arm.  She reports that her dog is up-to-date on her immunizations.  [name] has a current medication list which includes the following prescription(s): AMBULATORY NON FORMULARY MEDICATION, aspirin, colestipol, hydrochlorothiazide, levothyroxine, lisinopril, medroxyprogesterone, mupirocin ointment, OVER THE COUNTER MEDICATION, OVER THE COUNTER MEDICATION, and sertraline. Also is allergic to clarithromycin; neomycin; and penicillins.  Jessica Haynes  has a past medical history of Anxiety, Deafness in right ear, Diverticulosis, Endometrial cancer ([place]) (02/2013), HEPATIC CYST (05/13/2007), Hyperlipidemia, Hypertension, Hypertrophy of kidney, Hypothyroid, Irritable bladder, Macular degeneration, Meningioma ([place]), Shingles, and Skin cancer. Also  has a past surgical history that includes mastoid surgery; Cataract extraction (Bilateral); uterine biopsy (06/2013); Colonoscopy (11/2009); and Tonsillectomy.  Objective:   Vitals: BP 130/70   Pulse (!) 106   Temp 98 F (36.7 C) (Oral)   Resp 16   Ht 5\' 3"  (1.6 m)   Wt 181 lb (82.1 kg)   SpO2 95%   BMI 32.06 kg/m   Physical Exam  Constitutional: She is oriented to person, place, and time. She appears well-developed and well-nourished.  Cardiovascular: Normal rate, regular rhythm and intact distal pulses. Exam reveals no gallop and no friction rub.  No murmur heard. Pulmonary/Chest: No respiratory distress. She has no wheezes. She has no rales.  Musculoskeletal:       Right hand: She exhibits decreased range of motion (flexion and extension), tenderness (over bite wounds) and swelling  (near her wounds). She exhibits normal capillary refill. Normal sensation noted. Normal strength noted.       Hands: Neurological: She is alert and oriented to person, place, and time.  Psychiatric: She has a normal mood and affect.          Dg Hand Complete Right  Result Date: 12/31/2017 CLINICAL DATA:  Animal bite EXAM: RIGHT HAND - COMPLETE 3+ VIEW COMPARISON:  None. FINDINGS: The bones are osteopenic. There is osteoarthrosis of the first carpometacarpal joint. Chondrocalcinosis at the ulnocarpal interface. No acute fracture or dislocation. IMPRESSION: 1. No acute osseous injury. 2. First CMC joint osteoarthrosis. 3. Ulnocarpal calcinosis, which may be seen in the setting of CPPD. Electronically Signed   By: Ulyses Jarred M.D.   On: 12/31/2017 16:13     Results for orders placed or performed in visit on 12/31/17 (from the past 24 hour(s))  POCT CBC     Status: None   Collection Time: 12/31/17  3:34 PM  Result Value Ref Range   WBC 7.5 4.6 - 10.2 K/uL   Lymph, poc 1.5 0.6 - 3.4   POC LYMPH PERCENT 20.2 10 - 50 %L   MID (cbc) 0.3 0 - 0.9   POC MID % 4.0 0 - 12 %M   POC Granulocyte 5.7 2 - 6.9   Granulocyte percent 75.8 37 - 80 %G   RBC 4.17 4.04 - 5.48 M/uL   Hemoglobin 12.9 12.2 - 16.2 g/dL   HCT, POC 38.0 37.7 - 47.9 %   MCV 91.0 80 - 97 fL   MCH, POC 30.9 27 - 31.2 pg   MCHC 33.9 31.8 - 35.4 g/dL   RDW, POC 12.9 %   Platelet Count,  POC 283 142 - 424 K/uL   MPV 8.2 0 - 99.8 fL   Assessment and Plan :   Right hand pain - Plan: DG Hand Complete Right  Swelling of right hand - Plan: DG Hand Complete Right  Infected open wound - Plan: cefTRIAXone (ROCEPHIN) injection 1 g, DG Hand Complete Right  Animal bite - Plan: POCT CBC, DG Hand Complete Right  Case precepted with Dr. Brigitte Pulse.  She received IM ceftriaxone in clinic, she was observed for an additional 15 minutes given her penicillin allergy.  We will be using doxycycline instead of Augmentin for the same.  Patient is to  return to clinic tomorrow afternoon for recheck with me. Counseled patient on potential for adverse effects with medications prescribed today, patient verbalized understanding.  Jaynee Eagles, PA-C [facility] [phone] 12/31/2017  3:10 PM

## 2017-12-31 NOTE — Patient Instructions (Addendum)
Animal Bite Animal bites can range from mild to serious. An animal bite can result in a scratch on the skin, a deep open cut, a puncture of the skin, a crush injury, or tearing away of the skin or a body part. A small bite from a house pet will usually not cause serious problems. However, some animal bites can become infected or injure a bone or other tissue. Bites from certain animals can be more dangerous because of the risk of spreading rabies, which is a serious viral infection. This risk is higher with bites from stray animals or wild animals, such as raccoons, foxes, skunks, and bats. Dogs are responsible for most animal bites. Children are bitten more often than adults. What are the signs or symptoms? Common symptoms of an animal bite include:  Pain.  Bleeding.  Swelling.  Bruising.  How is this diagnosed? This condition may be diagnosed based on a physical exam and medical history. Your health care provider will examine the wound and ask for details about the animal and how the bite happened. You may also have tests, such as:  Blood tests to check for infection or to determine if surgery is needed.  X-rays to check for damage to bones or joints.  Culture test. This uses a sample of fluid from the wound to check for infection.  How is this treated? Treatment varies depending on the location and type of animal bite and your medical history. Treatment may include:  Wound care. This often includes cleaning the wound, flushing the wound with saline solution, and applying a bandage (dressing). Sometimes, the wound is left open to heal because of the high risk of infection. However, in some cases, the wound may be closed with stitches (sutures), staples, skin glue, or adhesive strips.  Antibiotic medicine.  Tetanus shot.  Rabies treatment if the animal could have rabies.  In some cases, bites that have become infected may require IV antibiotics and surgical treatment in the  hospital. Follow these instructions at home: Wound care  Follow instructions from your health care provider about how to take care of your wound. Make sure you: ? Wash your hands with soap and water before you change your dressing. If soap and water are not available, use hand sanitizer. ? Change your dressing as told by your health care provider. ? Leave sutures, skin glue, or adhesive strips in place. These skin closures may need to be in place for 2 weeks or longer. If adhesive strip edges start to loosen and curl up, you may trim the loose edges. Do not remove adhesive strips completely unless your health care provider tells you to do that.  Check your wound every day for signs of infection. Watch for: ? Increasing redness, swelling, or pain. ? Fluid, blood, or pus. General instructions  Take or apply over-the-counter and prescription medicines only as told by your health care provider.  If you were prescribed an antibiotic, take or apply it as told by your health care provider. Do not stop using the antibiotic even if your condition improves.  Keep the injured area raised (elevated) above the level of your heart while you are sitting or lying down, if this is possible.  If directed, apply ice to the injured area. ? Put ice in a plastic bag. ? Place a towel between your skin and the bag. ? Leave the ice on for 20 minutes, 2-3 times per day.  Keep all follow-up visits as told by your health care   provider. This is important. Contact a health care provider if:  You have increasing redness, swelling, or pain at the site of your wound.  You have a general feeling of sickness (malaise).  You feel nauseous or you vomit.  You have pain that does not get better. Get help right away if:  You have a red streak extending away from your wound.  You have fluid, blood, or pus coming from your wound.  You have a fever or chills.  You have trouble moving your injured area.  You have  numbness or tingling extending beyond the wound. This information is not intended to replace advice given to you by your health care provider. Make sure you discuss any questions you have with your health care provider. Document Released: 04/28/2011 Document Revised: 12/18/2015 Document Reviewed: 12/26/2014 Elsevier Interactive Patient Education  2018 [employer].     IF you received an x-ray today, you will receive an invoice from [facility]. Please contact [facility] at [phone] with questions or concerns regarding your invoice.   IF you received labwork today, you will receive an invoice from [place]. Please contact [facility] at [phone] with questions or concerns regarding your invoice.   Our billing staff will not be able to assist you with questions regarding bills from these companies.  You will be contacted with the lab results as soon as they are available. The fastest way to get your results is to activate your My Chart account. Instructions are located on the last page of this paperwork. If you have not heard from [place] regarding the results in 2 weeks, please contact this office.

## 2018-01-01 ENCOUNTER — Ambulatory Visit (INDEPENDENT_AMBULATORY_CARE_PROVIDER_SITE_OTHER): Payer: PPO | Admitting: Urgent Care

## 2018-01-01 ENCOUNTER — Encounter: Payer: Self-pay | Admitting: Urgent Care

## 2018-01-01 VITALS — BP 128/50 | HR 98 | Temp 98.1°F | Ht 63.0 in | Wt 181.0 lb

## 2018-01-01 DIAGNOSIS — L089 Local infection of the skin and subcutaneous tissue, unspecified: Secondary | ICD-10-CM | POA: Diagnosis not present

## 2018-01-01 DIAGNOSIS — T148XXA Other injury of unspecified body region, initial encounter: Secondary | ICD-10-CM

## 2018-01-01 DIAGNOSIS — M79641 Pain in right hand: Secondary | ICD-10-CM | POA: Diagnosis not present

## 2018-01-01 DIAGNOSIS — M7989 Other specified soft tissue disorders: Secondary | ICD-10-CM | POA: Diagnosis not present

## 2018-01-01 NOTE — Progress Notes (Signed)
    MRN: [id number] DOB: 01-Apr-1923  Subjective:   Jessica Haynes is a 84 y.o. female presenting for follow up on infection of multiple open wounds that she suffered from a dog bite.  Patient was last seen 12/31/2017.  She received IM ceftriaxone and was started on doxycycline.  We also updated her Tdap.  Today, she reports improvement in her hand.  The swelling has come down, she is able to use her hand a little bit better.  She reports that the largest open wound is draining.  Her son-in-law is presenting with her today and reports that he is willing to help her with her dressing changes.  Her dog is being quarantined currently.  Jessica Haynes has a current medication list which includes the following prescription(s): AMBULATORY NON FORMULARY MEDICATION, aspirin, colestipol, doxycycline, hydrochlorothiazide, levothyroxine, lisinopril, medroxyprogesterone, mupirocin ointment, OVER THE COUNTER MEDICATION, OVER THE COUNTER MEDICATION, and sertraline. Also is allergic to clarithromycin; neomycin; and penicillins.  Jessica Haynes  has a past medical history of Anxiety, Deafness in right ear, Diverticulosis, Endometrial cancer ([place]) (02/2013), HEPATIC CYST (05/13/2007), Hyperlipidemia, Hypertension, Hypertrophy of kidney, Hypothyroid, Irritable bladder, Macular degeneration, Meningioma ([place]), Shingles, and Skin cancer. Also  has a past surgical history that includes mastoid surgery; Cataract extraction (Bilateral); uterine biopsy (06/2013); Colonoscopy (11/2009); and Tonsillectomy.  Objective:   Vitals: BP (!) 128/50 (BP Location: Left Arm, Patient Position: Sitting, Cuff Size: Normal)   Pulse 98   Temp 98.1 F (36.7 C) (Oral)   Ht 5\' 3"  (1.6 m)   Wt 181 lb (82.1 kg)   SpO2 96%   BMI 32.06 kg/m   Physical Exam  Constitutional: She is oriented to person, place, and time. She appears well-developed and well-nourished.  Cardiovascular: Normal rate.  Pulmonary/Chest: Effort normal.  Musculoskeletal:        Hands: Neurological: She is alert and oriented to person, place, and time.   Results for orders placed or performed in visit on 12/31/17 (from the past 24 hour(s))  POCT CBC     Status: None   Collection Time: 12/31/17  3:34 PM  Result Value Ref Range   WBC 7.5 4.6 - 10.2 K/uL   Lymph, poc 1.5 0.6 - 3.4   POC LYMPH PERCENT 20.2 10 - 50 %L   MID (cbc) 0.3 0 - 0.9   POC MID % 4.0 0 - 12 %M   POC Granulocyte 5.7 2 - 6.9   Granulocyte percent 75.8 37 - 80 %G   RBC 4.17 4.04 - 5.48 M/uL   Hemoglobin 12.9 12.2 - 16.2 g/dL   HCT, POC 38.0 37.7 - 47.9 %   MCV 91.0 80 - 97 fL   MCH, POC 30.9 27 - 31.2 pg   MCHC 33.9 31.8 - 35.4 g/dL   RDW, POC 12.9 %   Platelet Count, POC 283 142 - 424 K/uL   MPV 8.2 0 - 99.8 fL    Assessment and Plan :   Right hand pain  Infected open wound  Swelling of right hand  Animal bite  Significant improvement in patient's symptoms.  I will have her maintain her doxycycline.  She is to perform dressing changes at home with the help of her family.  Recheck on Tuesday or sooner if she develops worsening symptoms of infection.Jaynee Eagles, PA-C [facility] [phone] 01/01/2018 3:09 PM

## 2018-01-01 NOTE — Patient Instructions (Addendum)
Please change her dressings daily after you bathe.  I will see you back on Tuesday unless you start developing fever, nausea, vomiting, belly pain.  These would be symptoms that I would want to see you back for in addition to symptoms of redness, arm swelling, red streaks along your hand or forearm.     Wound Infection A wound infection happens when germs start to grow in the wound. Germs that cause wound infections are most commonly bacteria. Other types of infections can occur as well. In some cases, infection can cause the wound to break open. Wound infections need treatment. If a wound infection is left untreated, complications can occur. This may include an infection in your bloodstream (sepsis) or in a bone (osteomyelitis). What are the causes? This condition is caused by germs growing in the wound. What increases the risk? The following factors may make you more likely to develop this condition:  Having a weak body defense system (immune system).  Having diabetes.  Taking steroid medicines for a long time (chronic use).  Smoking.  Older age.  Being overweight.  What are the signs or symptoms? Symptoms of this condition include:  Having more redness, swelling, or pain at the wound site.  Having more blood, pus, or fluid at the wound site.  A bad smell coming from a wound or bandage (dressing).  Having a fever.  Feeling tired or fatigued.  How is this diagnosed? This condition is diagnosed with a medical history and physical exam. You may also have blood tests. How is this treated? This condition is treated with an antibiotic medicine. The infection should improve 24-48 hours after you start antibiotics. Any redness around the wound should stop spreading, and the wound should be less painful. Follow these instructions at home: Medicines  Take or apply over-the-counter and prescription medicines only as told by your health care provider.  If you were prescribed  antibiotic medicine, take or apply it as told by your health care provider. Do not stop using the antibiotic even if your condition improves. Wound care  Clean the wound each day or as told by your health care provider. ? Wash the wound with mild soap and water. ? Rinse the wound with water to remove all soap. ? Pat the wound dry with a clean towel. Do not rub it.  Follow instructions from your health care provider about how to take care of your wound. Make sure you: ? Wash your hands with soap and water before you change your dressing. If soap and water are not available, use hand sanitizer. ? Change your dressing as told by your health care provider. ? Leave stitches (sutures), skin glue, or adhesive strips in place, if this applies. These skin closures may need to stay in place for 2 weeks or longer. If adhesive strip edges start to loosen and curl up, you may trim the loose edges. Do not remove adhesive strips completely unless your health care provider tells you to do that. Some wounds are left open to heal on their own.  Check your wound every day for signs of infection. Watch for: ? More redness, swelling, or pain. ? More fluid or blood. ? Warmth. ? Pus or a bad smell. General instructions  Keep the dressing dry until your health care provider says it can be removed.  Do not take baths, swim, use a hot tub, or do anything that would put your wound underwater until your health care provider approves.  Raise (elevate)  the injured area above the level of your heart while you are sitting or lying down.  Do not scratch or pick at the wound.  Keep all follow-up visits as told by your health care provider. This is important. Contact a health care provider if:  Your pain is not controlled with medicine.  You have more redness, swelling, or pain around your wound.  You have more fluid or blood coming from your wound.  Your wound feels warm to the touch.  You have pus coming from  your wound.  You continue to notice a bad smell coming from your wound or your dressing.  Your wound that was closed breaks open. Get help right away if:  You have a red streak going away from your wound.  You have a fever. This information is not intended to replace advice given to you by your health care provider. Make sure you discuss any questions you have with your health care provider. Document Released: 05/09/2003 Document Revised: 01/22/2016 Document Reviewed: 01/28/2015 Elsevier Interactive Patient Education  2018 [employer].

## 2018-01-04 ENCOUNTER — Other Ambulatory Visit: Payer: Self-pay

## 2018-01-04 ENCOUNTER — Encounter: Payer: Self-pay | Admitting: Urgent Care

## 2018-01-04 ENCOUNTER — Ambulatory Visit (INDEPENDENT_AMBULATORY_CARE_PROVIDER_SITE_OTHER): Payer: PPO | Admitting: Urgent Care

## 2018-01-04 VITALS — BP 108/54 | HR 103 | Temp 97.5°F | Ht 63.0 in

## 2018-01-04 DIAGNOSIS — M79641 Pain in right hand: Secondary | ICD-10-CM

## 2018-01-04 DIAGNOSIS — L089 Local infection of the skin and subcutaneous tissue, unspecified: Secondary | ICD-10-CM

## 2018-01-04 DIAGNOSIS — T148XXA Other injury of unspecified body region, initial encounter: Secondary | ICD-10-CM | POA: Diagnosis not present

## 2018-01-04 DIAGNOSIS — M7989 Other specified soft tissue disorders: Secondary | ICD-10-CM | POA: Diagnosis not present

## 2018-01-04 NOTE — Patient Instructions (Signed)
     IF you received an x-ray today, you will receive an invoice from [facility]. Please contact  Radiology at [phone] with questions or concerns regarding your invoice.   IF you received labwork today, you will receive an invoice from [facility]. Please contact [facility] at [phone] with questions or concerns regarding your invoice.   Our billing staff will not be able to assist you with questions regarding bills from these companies.  You will be contacted with the lab results as soon as they are available. The fastest way to get your results is to activate your My Chart account. Instructions are located on the last page of this paperwork. If you have not heard from us regarding the results in 2 weeks, please contact this office.     

## 2018-01-04 NOTE — Progress Notes (Signed)
   MRN: [id number] DOB: 16-Nov-1922  Subjective:   Jessica Haynes is a 84 y.o. female presenting for follow up on infected wound of her right hand from suffering a dog bite. Denies fever, drainage of pus or bleeding, red streaks, swelling. Her symptoms are improved overall and continues to do dressing.   Jessica Haynes has a current medication list which includes the following prescription(s): AMBULATORY NON FORMULARY MEDICATION, aspirin, colestipol, doxycycline, hydrochlorothiazide, levothyroxine, lisinopril, medroxyprogesterone, mupirocin ointment, OVER THE COUNTER MEDICATION, OVER THE COUNTER MEDICATION, and sertraline. Also is allergic to clarithromycin; neomycin; and penicillins.  Jessica Haynes  has a past medical history of Anxiety, Deafness in right ear, Diverticulosis, Endometrial cancer ([place]) (02/2013), HEPATIC CYST (05/13/2007), Hyperlipidemia, Hypertension, Hypertrophy of kidney, Hypothyroid, Irritable bladder, Macular degeneration, Meningioma ([place]), Shingles, and Skin cancer. Also  has a past surgical history that includes mastoid surgery; Cataract extraction (Bilateral); uterine biopsy (06/2013); Colonoscopy (11/2009); and Tonsillectomy.  Objective:   Vitals: BP (!) 108/54 (BP Location: Right Arm)   Pulse (!) 103   Temp (!) 97.5 F (36.4 C) (Oral)   Ht 5\' 3"  (1.6 m)   SpO2 98%   BMI 32.06 kg/m   Physical Exam  Constitutional: She is oriented to person, place, and time. She appears well-developed and well-nourished.  Cardiovascular: Normal rate.  Pulmonary/Chest: Effort normal.  Neurological: She is alert and oriented to person, place, and time.   Assessment and Plan :   Right hand pain  Infected open wound  Swelling of right hand  Animal bite   Improving very well, continue wound care. Follow up if symptoms do not resolve. Return-to-clinic precautions discussed, patient verbalized understanding.   Jaynee Eagles, PA-C [facility] [phone] 01/04/2018 12:58 PM

## 2018-06-08 DIAGNOSIS — N184 Chronic kidney disease, stage 4 (severe): Secondary | ICD-10-CM | POA: Diagnosis not present

## 2018-06-08 DIAGNOSIS — I10 Essential (primary) hypertension: Secondary | ICD-10-CM | POA: Diagnosis not present

## 2018-06-08 DIAGNOSIS — Z23 Encounter for immunization: Secondary | ICD-10-CM | POA: Diagnosis not present

## 2018-06-08 DIAGNOSIS — Z5181 Encounter for therapeutic drug level monitoring: Secondary | ICD-10-CM | POA: Diagnosis not present

## 2018-06-08 DIAGNOSIS — C541 Malignant neoplasm of endometrium: Secondary | ICD-10-CM | POA: Diagnosis not present

## 2018-06-08 DIAGNOSIS — Z0000 Encounter for general adult medical examination without abnormal findings: Secondary | ICD-10-CM | POA: Diagnosis not present

## 2018-06-08 DIAGNOSIS — F3341 Major depressive disorder, recurrent, in partial remission: Secondary | ICD-10-CM | POA: Diagnosis not present

## 2018-06-08 DIAGNOSIS — E039 Hypothyroidism, unspecified: Secondary | ICD-10-CM | POA: Diagnosis not present

## 2018-09-09 DIAGNOSIS — H353222 Exudative age-related macular degeneration, left eye, with inactive choroidal neovascularization: Secondary | ICD-10-CM | POA: Diagnosis not present

## 2018-09-09 DIAGNOSIS — H04123 Dry eye syndrome of bilateral lacrimal glands: Secondary | ICD-10-CM | POA: Diagnosis not present

## 2018-09-29 DIAGNOSIS — H04123 Dry eye syndrome of bilateral lacrimal glands: Secondary | ICD-10-CM | POA: Diagnosis not present

## 2018-10-31 DIAGNOSIS — H04123 Dry eye syndrome of bilateral lacrimal glands: Secondary | ICD-10-CM | POA: Diagnosis not present

## 2019-10-10 ENCOUNTER — Emergency Department (HOSPITAL_COMMUNITY): Payer: PPO

## 2019-10-10 ENCOUNTER — Encounter (HOSPITAL_COMMUNITY): Payer: Self-pay | Admitting: Emergency Medicine

## 2019-10-10 ENCOUNTER — Emergency Department (HOSPITAL_COMMUNITY)
Admission: EM | Admit: 2019-10-10 | Discharge: 2019-10-10 | Disposition: A | Discharge status: Home / Self Care | Payer: PPO | Attending: Emergency Medicine | Admitting: Emergency Medicine

## 2019-10-10 ENCOUNTER — Other Ambulatory Visit: Payer: Self-pay

## 2019-10-10 DIAGNOSIS — E039 Hypothyroidism, unspecified: Secondary | ICD-10-CM | POA: Insufficient documentation

## 2019-10-10 DIAGNOSIS — Y939 Activity, unspecified: Secondary | ICD-10-CM | POA: Insufficient documentation

## 2019-10-10 DIAGNOSIS — Z79899 Other long term (current) drug therapy: Secondary | ICD-10-CM | POA: Diagnosis not present

## 2019-10-10 DIAGNOSIS — M79642 Pain in left hand: Secondary | ICD-10-CM | POA: Insufficient documentation

## 2019-10-10 DIAGNOSIS — R079 Chest pain, unspecified: Secondary | ICD-10-CM | POA: Diagnosis not present

## 2019-10-10 DIAGNOSIS — S0990XA Unspecified injury of head, initial encounter: Secondary | ICD-10-CM | POA: Insufficient documentation

## 2019-10-10 DIAGNOSIS — S40811A Abrasion of right upper arm, initial encounter: Secondary | ICD-10-CM | POA: Diagnosis not present

## 2019-10-10 DIAGNOSIS — W010XXA Fall on same level from slipping, tripping and stumbling without subsequent striking against object, initial encounter: Secondary | ICD-10-CM | POA: Diagnosis not present

## 2019-10-10 DIAGNOSIS — Y929 Unspecified place or not applicable: Secondary | ICD-10-CM | POA: Diagnosis not present

## 2019-10-10 DIAGNOSIS — Y999 Unspecified external cause status: Secondary | ICD-10-CM | POA: Diagnosis not present

## 2019-10-10 DIAGNOSIS — S59911A Unspecified injury of right forearm, initial encounter: Secondary | ICD-10-CM | POA: Diagnosis not present

## 2019-10-10 DIAGNOSIS — Z7982 Long term (current) use of aspirin: Secondary | ICD-10-CM | POA: Insufficient documentation

## 2019-10-10 DIAGNOSIS — I10 Essential (primary) hypertension: Secondary | ICD-10-CM | POA: Insufficient documentation

## 2019-10-10 DIAGNOSIS — S0993XA Unspecified injury of face, initial encounter: Secondary | ICD-10-CM | POA: Diagnosis not present

## 2019-10-10 DIAGNOSIS — R040 Epistaxis: Secondary | ICD-10-CM | POA: Diagnosis not present

## 2019-10-10 NOTE — Discharge Instructions (Addendum)
Please call the orthopedic doctor regarding your left wrist injury. Continue splint until otherwise instructed by ortho. Please call your primary doctor schedule follow-up for recheck later this week.  Recommend ice, rest, elevation of your left hand.  Recommend Tylenol for pain control.  Return to ER if you develop any chest pain, difficulty breathing, episodes of passing out or other concerning symptom.

## 2019-10-10 NOTE — ED Notes (Signed)
Pt at CT

## 2019-10-10 NOTE — ED Notes (Signed)
Patient walked out of room and stated she was leaving. Patient holding wrist brace and refused to allow staff to put it back on. Patient stated she would take it home but she was not wearing it right now. Patient A&OX4 and ambulatory with a steady gait. Patient stated her daughter was outside to take her home and she was leaving now. An After Visit Summary was printed and given to the patient. Discharge instructions given and patient has no further questions at this time.

## 2019-10-10 NOTE — ED Triage Notes (Signed)
Per patient, states she tripped and fell going to the bathroom-right hand swollen-states she it front of face, had nosebleed-bleeding controlled at this time

## 2019-10-10 NOTE — ED Provider Notes (Signed)
Jessica Haynes Provider Note   CSN: [id number] Arrival date & time: 10/10/19  [id number]     History Chief Complaint  Patient presents with  . Fall    Jessica Haynes is a 86 y.o. female.  Presents to ER after a fall.  Patient states she tripped over something, landing on her right face, outstretched hands.  Noted some pain and swelling to her right face, left hand, abrasions to right forearm.  She is right-hand dominant.  Has been able to ambulate since accident.  Uses cane normally.  Denies any other injuries, specifically no neck pain, back pain, chest or abdominal pain.  HPI     Past Medical History:  Diagnosis Date  . Anxiety   . Deafness in right ear   . Diverticulosis   . Endometrial cancer ([place]) 02/2013  . HEPATIC CYST 05/13/2007   Qualifier: Diagnosis of  By: [place], [place]    . Hyperlipidemia   . Hypertension   . Hypertrophy of kidney   . Hypothyroid   . Irritable bladder   . Macular degeneration   . Meningioma ([place])   . Shingles   . Skin cancer     Patient Active Problem List   Diagnosis Date Noted  . Abdominal pain, left lower quadrant 12/09/2017  . Diarrhea 04/06/2014  . Deafness in right ear   . Endometrial cancer ([place]) 03/01/2013  . Macular degeneration 06/08/2011  . BELL'S PALSY 11/25/2009  . VITAMIN B12 DEFICIENCY 11/10/2007  . MENINGIOMA 05/13/2007  . HYPOTHYROIDISM 05/13/2007  . HYPERLIPIDEMIA 05/13/2007  . HYPERTENSION 05/13/2007  . Other specified disorders of bladder 05/13/2007    Past Surgical History:  Procedure Laterality Date  . CATARACT EXTRACTION Bilateral   . COLONOSCOPY  11/2009   normal  . mastoid surgery    . TONSILLECTOMY    . uterine biopsy  06/2013     OB History    Gravida  1   Para  1   Term  1   Preterm  0   AB  0   Living  1     SAB  0   TAB  0   Ectopic  0   Multiple  0   Live Births  1           Family History  Problem Relation Age of Onset  . Stroke  Mother   . Breast cancer Sister        1 sister w/breast  . Breast cancer Daughter 56  . Colon cancer Neg Hx     Social History   Tobacco Use  . Smoking status: Never Smoker  . Smokeless tobacco: Never Used  Substance Use Topics  . Alcohol use: No  . Drug use: No    Home Medications Prior to Admission medications   Medication Sig Start Date End Date Taking? Authorizing Provider  AMBULATORY NON FORMULARY MEDICATION Macular Dege Tabs Take 1 tablet by mouth twice a day    [provider]  aspirin 81 MG tablet Take 81 mg by mouth 3 (three) times a week.    [provider]  colestipol (COLESTID) 1 G tablet TAKE 2 TABLETS DAILY FOR DIARRHEA. TAKE 4 HOURS SEPARATE FROM OTHER MEDICATIONS. 03/07/15   Pyrtle, Lajuan Lines, MD  doxycycline (VIBRAMYCIN) 100 MG capsule Take 1 capsule (100 mg total) by mouth 2 (two) times daily. 12/31/17   Jaynee Eagles, PA-C  hydrochlorothiazide (HYDRODIURIL) 25 MG tablet TAKE 1 TABLET ONCE DAILY. 10/30/13  Norins, Heinz Knuckles, MD  levothyroxine (SYNTHROID, LEVOTHROID) 100 MCG tablet TAKE 1 TABLET ONCE DAILY. 10/30/13   Norins, Heinz Knuckles, MD  lisinopril (PRINIVIL,ZESTRIL) 20 MG tablet TAKE 1 TABLET ONCE DAILY. 10/30/13   Norins, Heinz Knuckles, MD  medroxyPROGESTERone (PROVERA) 10 MG tablet TAKE 1 TABLET ONCE DAILY. 12/31/15   Joylene John D, NP  mupirocin ointment (BACTROBAN) 2 % Apply 1 application topically daily.    [provider]  OVER THE COUNTER MEDICATION     [provider]  OVER THE COUNTER MEDICATION daily.    [provider]  sertraline (ZOLOFT) 100 MG tablet TAKE 1 TABLET ONCE DAILY. 06/07/13   Norins, Heinz Knuckles, MD    Allergies    Clarithromycin, Neomycin, and Penicillins  Review of Systems   Review of Systems  Constitutional: Negative for chills and fever.  HENT: Negative for ear pain and sore throat.   Eyes: Negative for pain and visual disturbance.  Respiratory: Negative for cough and shortness of breath.    Cardiovascular: Negative for chest pain and palpitations.  Gastrointestinal: Negative for abdominal pain and vomiting.  Genitourinary: Negative for dysuria and hematuria.  Musculoskeletal: Positive for arthralgias. Negative for back pain.  Skin: Negative for color change and rash.  Neurological: Negative for seizures and syncope.  All other systems reviewed and are negative.   Physical Exam Updated Vital Signs BP (!) 165/63 (BP Location: Left Arm)   Pulse 65   Temp 98.4 F (36.9 C) (Oral)   Resp 17   SpO2 100%   Physical Exam Vitals and nursing note reviewed.  Constitutional:      General: She is not in acute distress.    Appearance: She is well-developed.  HENT:     Head: Normocephalic.     Comments: R upper lip mild swelling, ecchymosis, no laceration noted on buccal mucosa, no laceration to tongue Eyes:     Conjunctiva/sclera: Conjunctivae normal.  Cardiovascular:     Rate and Rhythm: Normal rate and regular rhythm.     Heart sounds: No murmur.  Pulmonary:     Effort: Pulmonary effort is normal. No respiratory distress.     Breath sounds: Normal breath sounds.  Abdominal:     Palpations: Abdomen is soft.     Tenderness: There is no abdominal tenderness.  Musculoskeletal:     Cervical back: Neck supple.     Comments: Back: no C, T, L spine Extremities: L hand mild swelling, ecchymosis over base of thumb; R forearm with skin tears, no active bleeding, no TTP throughout extremity  Skin:    General: Skin is warm and dry.     Capillary Refill: Capillary refill takes less than 2 seconds.  Neurological:     Mental Status: She is alert.     ED Results / Procedures / Treatments   Labs (all labs ordered are listed, but only abnormal results are displayed) Labs Reviewed - No data to display  EKG None  Radiology DG Chest 2 View  Result Date: 10/10/2019 CLINICAL DATA:  Chest pain after fall. EXAM: CHEST - 2 VIEW COMPARISON:  Two-view chest x-ray 12/05/2009.  FINDINGS: The heart size is normal. Atherosclerotic calcifications are present at the aortic arch. Mild bibasilar opacities likely reflect atelectasis. Lungs are otherwise clear. The visualized soft tissues and bony thorax are unremarkable. IMPRESSION: 1. No acute cardiopulmonary disease. 2. Atherosclerosis. Electronically Signed   By: San Morelle M.D.   On: 10/10/2019 11:02   DG Forearm Right  Result Date: 10/10/2019  CLINICAL DATA:  Recent fall with arm pain, initial encounter EXAM: RIGHT FOREARM - 2 VIEW COMPARISON:  None. FINDINGS: There is no evidence of fracture or other focal bone lesions. Soft tissues are unremarkable. IMPRESSION: No acute abnormality noted. Electronically Signed   By: Inez Catalina M.D.   On: 10/10/2019 11:03   CT Head Wo Contrast  Result Date: 10/10/2019 CLINICAL DATA:  Fall, hit face EXAM: CT HEAD WITHOUT CONTRAST TECHNIQUE: Contiguous axial images were obtained from the base of the skull through the vertex without intravenous contrast. COMPARISON:  MRI 11/25/2009 FINDINGS: Brain: Partially calcified hyperdense mass seen within the right middle cranial fossa measuring 3.2 x 3.0 cm most compatible with meningioma. Old right periventricular/basal ganglia lacunar infarct. There is atrophy and chronic small vessel disease changes. No acute infarct, hemorrhage or hydrocephalus. Vascular: No hyperdense vessel or unexpected calcification. Skull: Prior right temporal craniotomy. No acute calvarial abnormality. Sinuses/Orbits: Metallic structure along the anterior right orbit/globe of unknown etiology. Postoperative changes and fluid within the right mastoid air cells. Paranasal sinuses clear. Other: None IMPRESSION: Partially calcified right middle cranial fossa extra-axial mass most compatible with meningioma. This is stable since 2011 MRI. Atrophy, chronic microvascular disease. No acute intracranial abnormality. Electronically Signed   By: Rolm Baptise M.D.   On: 10/10/2019  11:23   DG Hand Complete Left  Result Date: 10/10/2019 CLINICAL DATA:  Chest pain and left hand pain. EXAM: LEFT HAND - COMPLETE 3+ VIEW COMPARISON:  None ipsilateral FINDINGS: No evidence of acute fracture or dislocation. Juxta-articular erosion at the first [facility] joint, possibly gouty. Generalized osteopenia and mild interphalangeal osteoarthritis. Senile chondrocalcinosis without erosion at the TFCC. IMPRESSION: 1. No acute finding. 2. Juxta-articular erosion at the first [facility] joint which could be gouty. Electronically Signed   By: Monte Fantasia M.D.   On: 10/10/2019 11:02   CT Maxillofacial Wo Contrast  Result Date: 10/10/2019 CLINICAL DATA:  Fall.  Hit face.  Nose bleed. EXAM: CT MAXILLOFACIAL WITHOUT CONTRAST TECHNIQUE: Multidetector CT imaging of the maxillofacial structures was performed. Multiplanar CT image reconstructions were also generated. COMPARISON:  None. FINDINGS: Osseous: No fracture or mandibular dislocation. No destructive process. Orbits: No fracture. Globes are intact. Metallic structure noted in the anterior right orbit of unknown etiology, causing beam hardening artifact. Sinuses: Clear Soft tissues: Negative Limited intracranial: See head CT report. IMPRESSION: No evidence of facial or orbital fracture. Electronically Signed   By: Rolm Baptise M.D.   On: 10/10/2019 11:20    Procedures Procedures (including critical care time)  Medications Ordered in ED Medications - No data to display  ED Course  I have reviewed the triage vital signs and the nursing notes.  Pertinent labs & imaging results that were available during my care of the patient were reviewed by me and considered in my medical decision making (see chart for details).    MDM Rules/Calculators/A&P                      86 year old lady with fall.  Noted trauma to right face, right forearm skin tear, left hand ecchymosis.  No other trauma noted on exam.  CT head and max face negative.  X-ray left hand and  right forearm negative.  Provided local wound care to skin tears on right arm.  Left hand had some tenderness over the anatomic snuffbox, concern for occult fracture, occult scaphoid fx.  Will give thumb spica brace and recommend follow-up with Ortho as outpatient.    After  the discussed management above, the patient was determined to be safe for discharge.  The patient was in agreement with this plan and all questions regarding their care were answered.  ED return precautions were discussed and the patient will return to the ED with any significant worsening of condition.   Final Clinical Impression(s) / ED Diagnoses Final diagnoses:  Hand pain, left  Facial injury, initial encounter  Abrasion of right upper extremity, initial encounter    Rx / DC Orders ED Discharge Orders    None       Lucrezia Starch, MD 10/10/19 1708

## 2019-10-10 NOTE — ED Notes (Signed)
Jessica Haynes [phone], daughter wants an update on mother.

## 2019-10-17 DIAGNOSIS — M25532 Pain in left wrist: Secondary | ICD-10-CM | POA: Diagnosis not present

## 2019-10-17 DIAGNOSIS — S51801D Unspecified open wound of right forearm, subsequent encounter: Secondary | ICD-10-CM | POA: Diagnosis not present

## 2019-10-17 DIAGNOSIS — W19XXXD Unspecified fall, subsequent encounter: Secondary | ICD-10-CM | POA: Diagnosis not present

## 2019-10-19 DIAGNOSIS — Z5189 Encounter for other specified aftercare: Secondary | ICD-10-CM | POA: Diagnosis not present

## 2019-11-28 DIAGNOSIS — M79645 Pain in left finger(s): Secondary | ICD-10-CM | POA: Diagnosis not present

## 2019-11-28 DIAGNOSIS — M19032 Primary osteoarthritis, left wrist: Secondary | ICD-10-CM | POA: Diagnosis not present

## 2019-11-28 DIAGNOSIS — M1812 Unilateral primary osteoarthritis of first carpometacarpal joint, left hand: Secondary | ICD-10-CM | POA: Diagnosis not present

## 2019-12-25 DIAGNOSIS — H16143 Punctate keratitis, bilateral: Secondary | ICD-10-CM | POA: Diagnosis not present

## 2020-01-03 DIAGNOSIS — H18831 Recurrent erosion of cornea, right eye: Secondary | ICD-10-CM | POA: Diagnosis not present

## 2020-01-10 DIAGNOSIS — H16143 Punctate keratitis, bilateral: Secondary | ICD-10-CM | POA: Diagnosis not present

## 2020-02-06 DIAGNOSIS — I10 Essential (primary) hypertension: Secondary | ICD-10-CM | POA: Diagnosis not present

## 2020-02-06 DIAGNOSIS — E538 Deficiency of other specified B group vitamins: Secondary | ICD-10-CM | POA: Diagnosis not present

## 2020-02-06 DIAGNOSIS — E039 Hypothyroidism, unspecified: Secondary | ICD-10-CM | POA: Diagnosis not present

## 2020-02-06 DIAGNOSIS — Z5181 Encounter for therapeutic drug level monitoring: Secondary | ICD-10-CM | POA: Diagnosis not present

## 2020-12-13 DIAGNOSIS — H02831 Dermatochalasis of right upper eyelid: Secondary | ICD-10-CM | POA: Diagnosis not present

## 2020-12-13 DIAGNOSIS — H02834 Dermatochalasis of left upper eyelid: Secondary | ICD-10-CM | POA: Diagnosis not present

## 2020-12-16 DIAGNOSIS — M25512 Pain in left shoulder: Secondary | ICD-10-CM | POA: Diagnosis not present

## 2020-12-24 ENCOUNTER — Other Ambulatory Visit: Payer: Self-pay

## 2020-12-24 ENCOUNTER — Emergency Department (HOSPITAL_COMMUNITY): Payer: PPO

## 2020-12-24 ENCOUNTER — Inpatient Hospital Stay (HOSPITAL_COMMUNITY): Payer: PPO

## 2020-12-24 ENCOUNTER — Inpatient Hospital Stay (HOSPITAL_COMMUNITY)
Admission: EM | Admit: 2020-12-24 | Discharge: 2020-12-30 | DRG: 065 | Disposition: A | Discharge status: Hospice | Payer: PPO | Attending: Internal Medicine | Admitting: Internal Medicine

## 2020-12-24 DIAGNOSIS — R9389 Abnormal findings on diagnostic imaging of other specified body structures: Secondary | ICD-10-CM

## 2020-12-24 DIAGNOSIS — Z9841 Cataract extraction status, right eye: Secondary | ICD-10-CM

## 2020-12-24 DIAGNOSIS — I63511 Cerebral infarction due to unspecified occlusion or stenosis of right middle cerebral artery: Principal | ICD-10-CM | POA: Diagnosis present

## 2020-12-24 DIAGNOSIS — Z8542 Personal history of malignant neoplasm of other parts of uterus: Secondary | ICD-10-CM

## 2020-12-24 DIAGNOSIS — R29810 Facial weakness: Secondary | ICD-10-CM | POA: Diagnosis not present

## 2020-12-24 DIAGNOSIS — D72829 Elevated white blood cell count, unspecified: Secondary | ICD-10-CM | POA: Diagnosis present

## 2020-12-24 DIAGNOSIS — W19XXXA Unspecified fall, initial encounter: Secondary | ICD-10-CM | POA: Diagnosis present

## 2020-12-24 DIAGNOSIS — E039 Hypothyroidism, unspecified: Secondary | ICD-10-CM | POA: Diagnosis present

## 2020-12-24 DIAGNOSIS — F0390 Unspecified dementia without behavioral disturbance: Secondary | ICD-10-CM | POA: Diagnosis not present

## 2020-12-24 DIAGNOSIS — H9191 Unspecified hearing loss, right ear: Secondary | ICD-10-CM | POA: Diagnosis not present

## 2020-12-24 DIAGNOSIS — Z515 Encounter for palliative care: Secondary | ICD-10-CM | POA: Diagnosis not present

## 2020-12-24 DIAGNOSIS — R4781 Slurred speech: Secondary | ICD-10-CM | POA: Diagnosis not present

## 2020-12-24 DIAGNOSIS — R414 Neurologic neglect syndrome: Secondary | ICD-10-CM | POA: Diagnosis not present

## 2020-12-24 DIAGNOSIS — R440 Auditory hallucinations: Secondary | ICD-10-CM | POA: Diagnosis not present

## 2020-12-24 DIAGNOSIS — I129 Hypertensive chronic kidney disease with stage 1 through stage 4 chronic kidney disease, or unspecified chronic kidney disease: Secondary | ICD-10-CM | POA: Diagnosis present

## 2020-12-24 DIAGNOSIS — Z86011 Personal history of benign neoplasm of the brain: Secondary | ICD-10-CM

## 2020-12-24 DIAGNOSIS — Z66 Do not resuscitate: Secondary | ICD-10-CM | POA: Diagnosis not present

## 2020-12-24 DIAGNOSIS — Z9842 Cataract extraction status, left eye: Secondary | ICD-10-CM

## 2020-12-24 DIAGNOSIS — I63231 Cerebral infarction due to unspecified occlusion or stenosis of right carotid arteries: Secondary | ICD-10-CM | POA: Diagnosis not present

## 2020-12-24 DIAGNOSIS — E1122 Type 2 diabetes mellitus with diabetic chronic kidney disease: Secondary | ICD-10-CM | POA: Diagnosis present

## 2020-12-24 DIAGNOSIS — G459 Transient cerebral ischemic attack, unspecified: Secondary | ICD-10-CM | POA: Diagnosis not present

## 2020-12-24 DIAGNOSIS — T17590A Other foreign object in bronchus causing asphyxiation, initial encounter: Secondary | ICD-10-CM | POA: Diagnosis present

## 2020-12-24 DIAGNOSIS — M19012 Primary osteoarthritis, left shoulder: Secondary | ICD-10-CM | POA: Diagnosis present

## 2020-12-24 DIAGNOSIS — Z5320 Procedure and treatment not carried out because of patient's decision for unspecified reasons: Secondary | ICD-10-CM | POA: Diagnosis not present

## 2020-12-24 DIAGNOSIS — F419 Anxiety disorder, unspecified: Secondary | ICD-10-CM | POA: Diagnosis present

## 2020-12-24 DIAGNOSIS — I639 Cerebral infarction, unspecified: Secondary | ICD-10-CM | POA: Diagnosis not present

## 2020-12-24 DIAGNOSIS — C541 Malignant neoplasm of endometrium: Secondary | ICD-10-CM | POA: Diagnosis not present

## 2020-12-24 DIAGNOSIS — Z6824 Body mass index (BMI) 24.0-24.9, adult: Secondary | ICD-10-CM

## 2020-12-24 DIAGNOSIS — Z20822 Contact with and (suspected) exposure to covid-19: Secondary | ICD-10-CM | POA: Diagnosis present

## 2020-12-24 DIAGNOSIS — Z7982 Long term (current) use of aspirin: Secondary | ICD-10-CM

## 2020-12-24 DIAGNOSIS — F32A Depression, unspecified: Secondary | ICD-10-CM | POA: Diagnosis present

## 2020-12-24 DIAGNOSIS — I6623 Occlusion and stenosis of bilateral posterior cerebral arteries: Secondary | ICD-10-CM | POA: Diagnosis not present

## 2020-12-24 DIAGNOSIS — Z85828 Personal history of other malignant neoplasm of skin: Secondary | ICD-10-CM | POA: Diagnosis not present

## 2020-12-24 DIAGNOSIS — I63311 Cerebral infarction due to thrombosis of right middle cerebral artery: Secondary | ICD-10-CM | POA: Diagnosis not present

## 2020-12-24 DIAGNOSIS — N1832 Chronic kidney disease, stage 3b: Secondary | ICD-10-CM | POA: Diagnosis present

## 2020-12-24 DIAGNOSIS — Z823 Family history of stroke: Secondary | ICD-10-CM

## 2020-12-24 DIAGNOSIS — E785 Hyperlipidemia, unspecified: Secondary | ICD-10-CM | POA: Diagnosis present

## 2020-12-24 DIAGNOSIS — I6389 Other cerebral infarction: Secondary | ICD-10-CM | POA: Diagnosis not present

## 2020-12-24 DIAGNOSIS — G8194 Hemiplegia, unspecified affecting left nondominant side: Secondary | ICD-10-CM | POA: Diagnosis present

## 2020-12-24 DIAGNOSIS — H3530 Unspecified macular degeneration: Secondary | ICD-10-CM | POA: Diagnosis present

## 2020-12-24 DIAGNOSIS — R32 Unspecified urinary incontinence: Secondary | ICD-10-CM | POA: Diagnosis present

## 2020-12-24 DIAGNOSIS — G8929 Other chronic pain: Secondary | ICD-10-CM | POA: Diagnosis present

## 2020-12-24 DIAGNOSIS — R451 Restlessness and agitation: Secondary | ICD-10-CM | POA: Diagnosis not present

## 2020-12-24 DIAGNOSIS — N179 Acute kidney failure, unspecified: Secondary | ICD-10-CM | POA: Diagnosis not present

## 2020-12-24 DIAGNOSIS — Z638 Other specified problems related to primary support group: Secondary | ICD-10-CM

## 2020-12-24 DIAGNOSIS — Z881 Allergy status to other antibiotic agents status: Secondary | ICD-10-CM

## 2020-12-24 DIAGNOSIS — R918 Other nonspecific abnormal finding of lung field: Secondary | ICD-10-CM | POA: Diagnosis not present

## 2020-12-24 DIAGNOSIS — Z79890 Hormone replacement therapy (postmenopausal): Secondary | ICD-10-CM

## 2020-12-24 DIAGNOSIS — I6523 Occlusion and stenosis of bilateral carotid arteries: Secondary | ICD-10-CM | POA: Diagnosis present

## 2020-12-24 DIAGNOSIS — Z803 Family history of malignant neoplasm of breast: Secondary | ICD-10-CM

## 2020-12-24 DIAGNOSIS — G4710 Hypersomnia, unspecified: Secondary | ICD-10-CM | POA: Diagnosis present

## 2020-12-24 DIAGNOSIS — D320 Benign neoplasm of cerebral meninges: Secondary | ICD-10-CM | POA: Diagnosis present

## 2020-12-24 DIAGNOSIS — R441 Visual hallucinations: Secondary | ICD-10-CM | POA: Diagnosis present

## 2020-12-24 DIAGNOSIS — Z7189 Other specified counseling: Secondary | ICD-10-CM | POA: Diagnosis not present

## 2020-12-24 DIAGNOSIS — R531 Weakness: Secondary | ICD-10-CM | POA: Diagnosis not present

## 2020-12-24 DIAGNOSIS — S0990XA Unspecified injury of head, initial encounter: Secondary | ICD-10-CM | POA: Diagnosis not present

## 2020-12-24 DIAGNOSIS — I10 Essential (primary) hypertension: Secondary | ICD-10-CM | POA: Diagnosis present

## 2020-12-24 DIAGNOSIS — Z8673 Personal history of transient ischemic attack (TIA), and cerebral infarction without residual deficits: Secondary | ICD-10-CM | POA: Diagnosis not present

## 2020-12-24 DIAGNOSIS — Z880 Allergy status to penicillin: Secondary | ICD-10-CM

## 2020-12-24 DIAGNOSIS — Z789 Other specified health status: Secondary | ICD-10-CM | POA: Diagnosis not present

## 2020-12-24 DIAGNOSIS — G510 Bell's palsy: Secondary | ICD-10-CM | POA: Diagnosis not present

## 2020-12-24 DIAGNOSIS — R0681 Apnea, not elsewhere classified: Secondary | ICD-10-CM | POA: Diagnosis not present

## 2020-12-24 DIAGNOSIS — Z79899 Other long term (current) drug therapy: Secondary | ICD-10-CM

## 2020-12-24 DIAGNOSIS — R627 Adult failure to thrive: Secondary | ICD-10-CM | POA: Diagnosis present

## 2020-12-24 DIAGNOSIS — I6602 Occlusion and stenosis of left middle cerebral artery: Secondary | ICD-10-CM | POA: Diagnosis not present

## 2020-12-24 LAB — PROTIME-INR
INR: 1 (ref 0.8–1.2)
Prothrombin Time: 13.4 seconds (ref 11.4–15.2)

## 2020-12-24 LAB — DIFFERENTIAL
Abs Immature Granulocytes: 0.03 10*3/uL (ref 0.00–0.07)
Basophils Absolute: 0 10*3/uL (ref 0.0–0.1)
Basophils Relative: 0 %
Eosinophils Absolute: 0 10*3/uL (ref 0.0–0.5)
Eosinophils Relative: 0 %
Immature Granulocytes: 0 %
Lymphocytes Relative: 13 %
Lymphs Abs: 1.1 10*3/uL (ref 0.7–4.0)
Monocytes Absolute: 0.6 10*3/uL (ref 0.1–1.0)
Monocytes Relative: 7 %
Neutro Abs: 6.8 10*3/uL (ref 1.7–7.7)
Neutrophils Relative %: 80 %

## 2020-12-24 LAB — COMPREHENSIVE METABOLIC PANEL
ALT: 11 U/L (ref 0–44)
AST: 24 U/L (ref 15–41)
Albumin: 3.8 g/dL (ref 3.5–5.0)
Alkaline Phosphatase: 48 U/L (ref 38–126)
Anion gap: 7 (ref 5–15)
BUN: 22 mg/dL (ref 8–23)
CO2: 25 mmol/L (ref 22–32)
Calcium: 9.4 mg/dL (ref 8.9–10.3)
Chloride: 110 mmol/L (ref 98–111)
Creatinine, Ser: 1.66 mg/dL — ABNORMAL HIGH (ref 0.44–1.00)
GFR, Estimated: 28 mL/min — ABNORMAL LOW (ref >60)
Glucose, Bld: 122 mg/dL — ABNORMAL HIGH (ref 70–99)
Potassium: 4.3 mmol/L (ref 3.5–5.1)
Sodium: 142 mmol/L (ref 135–145)
Total Bilirubin: 0.9 mg/dL (ref 0.3–1.2)
Total Protein: 6.3 g/dL — ABNORMAL LOW (ref 6.5–8.1)

## 2020-12-24 LAB — CBC
HCT: 36.8 % (ref 36.0–46.0)
Hemoglobin: 12.1 g/dL (ref 12.0–15.0)
MCH: 30.9 pg (ref 26.0–34.0)
MCHC: 32.9 g/dL (ref 30.0–36.0)
MCV: 93.9 fL (ref 80.0–100.0)
Platelets: 177 10*3/uL (ref 150–400)
RBC: 3.92 MIL/uL (ref 3.87–5.11)
RDW: 13.3 % (ref 11.5–15.5)
WBC: 8.6 10*3/uL (ref 4.0–10.5)
nRBC: 0 % (ref 0.0–0.2)

## 2020-12-24 LAB — I-STAT CHEM 8, ED
BUN: 30 mg/dL — ABNORMAL HIGH (ref 8–23)
Calcium, Ion: 1.23 mmol/L (ref 1.15–1.40)
Chloride: 110 mmol/L (ref 98–111)
Creatinine, Ser: 1.8 mg/dL — ABNORMAL HIGH (ref 0.44–1.00)
Glucose, Bld: 117 mg/dL — ABNORMAL HIGH (ref 70–99)
HCT: 33 % — ABNORMAL LOW (ref 36.0–46.0)
Hemoglobin: 11.2 g/dL — ABNORMAL LOW (ref 12.0–15.0)
Potassium: 4.4 mmol/L (ref 3.5–5.1)
Sodium: 143 mmol/L (ref 135–145)
TCO2: 26 mmol/L (ref 22–32)

## 2020-12-24 LAB — SARS CORONAVIRUS 2 (TAT 6-24 HRS): SARS Coronavirus 2: NEGATIVE

## 2020-12-24 LAB — CBG MONITORING, ED: Glucose-Capillary: 113 mg/dL — ABNORMAL HIGH (ref 70–99)

## 2020-12-24 LAB — APTT: aPTT: 26 seconds (ref 24–36)

## 2020-12-24 MED ORDER — ASPIRIN 81 MG PO CHEW
162.0000 mg | CHEWABLE_TABLET | Freq: Every day | ORAL | Status: DC
  Filled 2020-12-24: qty 2

## 2020-12-24 MED ORDER — SENNOSIDES-DOCUSATE SODIUM 8.6-50 MG PO TABS: 1.0000 | ORAL_TABLET | Freq: Every evening | ORAL | Status: DC | PRN

## 2020-12-24 MED ORDER — SODIUM CHLORIDE 0.9% FLUSH: 3.0000 mL | Freq: Once | INTRAVENOUS | Status: DC

## 2020-12-24 MED ORDER — HYDRALAZINE HCL 20 MG/ML IJ SOLN: 5.0000 mg | Freq: Four times a day (QID) | INTRAMUSCULAR | Status: DC | PRN

## 2020-12-24 MED ORDER — SERTRALINE HCL 100 MG PO TABS
100.0000 mg | ORAL_TABLET | Freq: Every day | ORAL | Status: DC
  Administered 2020-12-25 – 2020-12-26 (×2): 100 mg via ORAL
  Filled 2020-12-24 (×2): qty 1

## 2020-12-24 MED ORDER — CLEVIDIPINE BUTYRATE 0.5 MG/ML IV EMUL
0.0000 mg/h | INTRAVENOUS | Status: DC
  Administered 2020-12-24: 1 mg/h via INTRAVENOUS

## 2020-12-24 MED ORDER — LISINOPRIL 20 MG PO TABS
20.0000 mg | ORAL_TABLET | Freq: Every day | ORAL | Status: DC
  Administered 2020-12-25: 20 mg via ORAL
  Filled 2020-12-24: qty 1

## 2020-12-24 MED ORDER — ENOXAPARIN SODIUM 30 MG/0.3ML IJ SOSY: 30.0000 mg | PREFILLED_SYRINGE | INTRAMUSCULAR | Status: DC

## 2020-12-24 MED ORDER — OLANZAPINE 5 MG PO TBDP
5.0000 mg | ORAL_TABLET | Freq: Two times a day (BID) | ORAL | Status: DC | PRN
  Administered 2020-12-24: 5 mg via ORAL
  Filled 2020-12-24 (×2): qty 1

## 2020-12-24 MED ORDER — ASPIRIN 325 MG PO TABS
ORAL_TABLET | ORAL | Status: AC
  Administered 2020-12-24: 650 mg
  Filled 2020-12-24: qty 2

## 2020-12-24 MED ORDER — IOHEXOL 350 MG/ML SOLN
100.0000 mL | Freq: Once | INTRAVENOUS | Status: AC | PRN
  Administered 2020-12-24: 100 mL via INTRAVENOUS

## 2020-12-24 MED ORDER — SODIUM CHLORIDE 0.45 % IV SOLN: INTRAVENOUS | Status: DC

## 2020-12-24 MED ORDER — LEVOTHYROXINE SODIUM 100 MCG PO TABS
100.0000 ug | ORAL_TABLET | Freq: Every day | ORAL | Status: DC
  Administered 2020-12-25 – 2020-12-27 (×3): 100 ug via ORAL
  Filled 2020-12-24 (×4): qty 1

## 2020-12-24 MED ORDER — ACETAMINOPHEN 325 MG PO TABS
650.0000 mg | ORAL_TABLET | ORAL | Status: DC | PRN
  Administered 2020-12-24: 650 mg via ORAL
  Filled 2020-12-24: qty 2

## 2020-12-24 MED ORDER — DOXYCYCLINE HYCLATE 100 MG PO TABS
100.0000 mg | ORAL_TABLET | Freq: Two times a day (BID) | ORAL | Status: DC
  Administered 2020-12-24 – 2020-12-25 (×2): 100 mg via ORAL
  Filled 2020-12-24 (×2): qty 1

## 2020-12-24 MED ORDER — COLESTIPOL HCL 1 G PO TABS
1.0000 g | ORAL_TABLET | Freq: Every day | ORAL | Status: DC
  Administered 2020-12-25: 1 g via ORAL
  Filled 2020-12-24 (×2): qty 1

## 2020-12-24 MED ORDER — ACETAMINOPHEN 160 MG/5ML PO SOLN: 650.0000 mg | ORAL | Status: DC | PRN

## 2020-12-24 MED ORDER — HYPROMELLOSE (GONIOSCOPIC) 2.5 % OP SOLN
1.0000 [drp] | Freq: Three times a day (TID) | OPHTHALMIC | Status: DC | PRN
  Administered 2020-12-25 – 2020-12-27 (×2): 1 [drp] via OPHTHALMIC
  Filled 2020-12-24: qty 15

## 2020-12-24 MED ORDER — CLEVIDIPINE BUTYRATE 0.5 MG/ML IV EMUL
INTRAVENOUS | Status: AC
  Filled 2020-12-24: qty 50

## 2020-12-24 MED ORDER — ACETAMINOPHEN 650 MG RE SUPP: 650.0000 mg | RECTAL | Status: DC | PRN

## 2020-12-24 MED ORDER — STROKE: EARLY STAGES OF RECOVERY BOOK
Freq: Once | Status: DC
  Filled 2020-12-24: qty 1

## 2020-12-24 NOTE — Progress Notes (Signed)
Called and left a voicemail for pt's daughter, pt states " no one knows I am here, I am nobody here" and " I can't find my purse"

## 2020-12-24 NOTE — ED Provider Notes (Signed)
[facility] EMERGENCY DEPARTMENT Provider Note   CSN: [id number] Arrival date & time: 12/24/20  1121  An emergency department physician performed an initial assessment on this suspected stroke patient at 1123.  History Chief Complaint  Patient presents with  . Code Stroke    Jessica Haynes is a 87 y.o. female.  87 year old female with past medical history below who presents with code stroke.  History severely limited as patient is not cooperative and seems to have some memory problems.  Patient was reportedly last seen normal around 1830 yesterday.  Today she was noted to not be acting like herself and have left-sided weakness involving arm and leg and EMS was called.  EMS noted weakness on the left side as well as right facial droop but patient does have history of Bell's palsy on the right.  Code stroke called in route.  The patient denies any weakness and repeatedly requests "get me out of here. There's nothing wrong with me."  LEVEL 5 CAVEAT DUE TO AGITATION AND MEMORY PROBLEMS  The history is provided by the EMS personnel and the nursing home.       Past Medical History:  Diagnosis Date  . Anxiety   . Deafness in right ear   . Diverticulosis   . Endometrial cancer ([place]) 02/2013  . HEPATIC CYST 05/13/2007   Qualifier: Diagnosis of  By: [place], [place]    . Hyperlipidemia   . Hypertension   . Hypertrophy of kidney   . Hypothyroid   . Irritable bladder   . Macular degeneration   . Meningioma ([place])   . Shingles   . Skin cancer     Patient Active Problem List   Diagnosis Date Noted  . Abdominal pain, left lower quadrant 12/09/2017  . Diarrhea 04/06/2014  . Deafness in right ear   . Endometrial cancer ([place]) 03/01/2013  . Macular degeneration 06/08/2011  . BELL'S PALSY 11/25/2009  . VITAMIN B12 DEFICIENCY 11/10/2007  . MENINGIOMA 05/13/2007  . HYPOTHYROIDISM 05/13/2007  . HYPERLIPIDEMIA 05/13/2007  . HYPERTENSION 05/13/2007  . Other  specified disorders of bladder 05/13/2007    Past Surgical History:  Procedure Laterality Date  . CATARACT EXTRACTION Bilateral   . COLONOSCOPY  11/2009   normal  . mastoid surgery    . TONSILLECTOMY    . uterine biopsy  06/2013     OB History    Gravida  1   Para  1   Term  1   Preterm  0   AB  0   Living  1     SAB  0   IAB  0   Ectopic  0   Multiple  0   Live Births  1           Family History  Problem Relation Age of Onset  . Stroke Mother   . Breast cancer Sister        1 sister w/breast  . Breast cancer Daughter 61  . Colon cancer Neg Hx     Social History   Tobacco Use  . Smoking status: Never Smoker  . Smokeless tobacco: Never Used  Substance Use Topics  . Alcohol use: No  . Drug use: No    Home Medications Prior to Admission medications   Medication Sig Start Date End Date Taking? Authorizing Provider  AMBULATORY NON FORMULARY MEDICATION Macular Dege Tabs Take 1 tablet by mouth twice a day    [provider]  aspirin 81  MG tablet Take 81 mg by mouth 3 (three) times a week.    [provider]  colestipol (COLESTID) 1 G tablet TAKE 2 TABLETS DAILY FOR DIARRHEA. TAKE 4 HOURS SEPARATE FROM OTHER MEDICATIONS. 03/07/15   Pyrtle, Lajuan Lines, MD  doxycycline (VIBRAMYCIN) 100 MG capsule Take 1 capsule (100 mg total) by mouth 2 (two) times daily. 12/31/17   Jaynee Eagles, PA-C  hydrochlorothiazide (HYDRODIURIL) 25 MG tablet TAKE 1 TABLET ONCE DAILY. 10/30/13   Norins, Heinz Knuckles, MD  levothyroxine (SYNTHROID, LEVOTHROID) 100 MCG tablet TAKE 1 TABLET ONCE DAILY. 10/30/13   Norins, Heinz Knuckles, MD  lisinopril (PRINIVIL,ZESTRIL) 20 MG tablet TAKE 1 TABLET ONCE DAILY. 10/30/13   Norins, Heinz Knuckles, MD  medroxyPROGESTERone (PROVERA) 10 MG tablet TAKE 1 TABLET ONCE DAILY. 12/31/15   Joylene John D, NP  mupirocin ointment (BACTROBAN) 2 % Apply 1 application topically daily.    [provider]  OVER THE COUNTER MEDICATION     [provider]  OVER THE COUNTER MEDICATION daily.    [provider]  sertraline (ZOLOFT) 100 MG tablet TAKE 1 TABLET ONCE DAILY. 06/07/13   Norins, Heinz Knuckles, MD    Allergies    Clarithromycin, Neomycin, and Penicillins  Review of Systems   Review of Systems  Unable to perform ROS: Other   Patient uncooperative and memory problems  Physical Exam Updated Vital Signs BP (!) 124/52   Pulse 84   Resp 16   Wt 63.2 kg   SpO2 96%   BMI 24.68 kg/m   Physical Exam Vitals and nursing note reviewed.  Constitutional:      General: She is not in acute distress.    Appearance: Normal appearance.     Comments: Frail, elderly woman awake and agitated  HENT:     Head: Normocephalic and atraumatic.  Eyes:     Conjunctiva/sclera: Conjunctivae normal.     Pupils: Pupils are equal, round, and reactive to light.  Cardiovascular:     Rate and Rhythm: Normal rate and regular rhythm.     Heart sounds: Normal heart sounds. No murmur heard.   Pulmonary:     Effort: Pulmonary effort is normal.     Breath sounds: Normal breath sounds.  Abdominal:     General: Abdomen is flat. Bowel sounds are normal. There is no distension.     Palpations: Abdomen is soft.     Tenderness: There is no abdominal tenderness.  Musculoskeletal:     Right lower leg: No edema.     Left lower leg: No edema.  Skin:    General: Skin is warm and dry.  Neurological:     Mental Status: She is alert.     Comments: Oriented x 1, fluent speech, + R facial droop involving forehead and mouth; will not cooperate with neuro exam but appears to be moving all 4 extremities spontaneously without obvious unilateral weakness; no clonus  Psychiatric:        Mood and Affect: Affect is labile and angry.        Speech: Speech is tangential.        Behavior: Behavior is uncooperative and aggressive.        Cognition and Memory: Memory is impaired.        Judgment: Judgment is impulsive and inappropriate.     ED  Results / Procedures / Treatments   Labs (all labs ordered are listed, but only abnormal results are displayed) Labs Reviewed  COMPREHENSIVE METABOLIC PANEL -  Abnormal; Notable for the following components:      Result Value   Glucose, Bld 122 (*)    Creatinine, Ser 1.66 (*)    Total Protein 6.3 (*)    GFR, Estimated 28 (*)    All other components within normal limits  I-STAT CHEM 8, ED - Abnormal; Notable for the following components:   BUN 30 (*)    Creatinine, Ser 1.80 (*)    Glucose, Bld 117 (*)    Hemoglobin 11.2 (*)    HCT 33.0 (*)    All other components within normal limits  CBG MONITORING, ED - Abnormal; Notable for the following components:   Glucose-Capillary 113 (*)    All other components within normal limits  SARS CORONAVIRUS 2 (TAT 6-24 HRS)  PROTIME-INR  APTT  CBC  DIFFERENTIAL    EKG None  Radiology CT CEREBRAL PERFUSION W CONTRAST  Result Date: 12/24/2020 CLINICAL DATA:  Neuro deficit, acute stroke suspected. EXAM: CT ANGIOGRAPHY HEAD AND NECK CT PERFUSION BRAIN TECHNIQUE: Multidetector CT imaging of the head and neck was performed using the standard protocol during bolus administration of intravenous contrast. Multiplanar CT image reconstructions and MIPs were obtained to evaluate the vascular anatomy. Carotid stenosis measurements (when applicable) are obtained utilizing NASCET criteria, using the distal internal carotid diameter as the denominator. Multiphase CT imaging of the brain was performed following IV bolus contrast injection. Subsequent parametric perfusion maps were calculated using RAPID software. CONTRAST:  190m OMNIPAQUE IOHEXOL 350 MG/ML SOLN COMPARISON:  Same day CT head. FINDINGS: CTA NECK FINDINGS Aortic arch: Predominately calcific atherosclerosis of the aorta. Great vessel origins are patent. Right carotid system: Extensive calcific and noncalcific atherosclerosis at the carotid bifurcation. Severe, nearly occlusive atherosclerosis at the  carotid bifurcation with only faint, predominately peripheral opacification in the ICA in the neck and faint, thready opacification at the skull base. Left carotid system: Calcific and noncalcific atherosclerosis at the carotid bifurcation with ulceration. No evidence of greater than 50% stenosis. Vertebral arteries: Mildly right dominant. Tortuous vertebral arteries bilaterally without hemodynamically significant stenosis. Skeleton: Moderate to severe multilevel degenerative disc disease. Multilevel facet/uncovertebral hypertrophy with varying degrees of neural foraminal stenosis. Other neck: Heterogeneous thyroid with multiple large nodules, compatible with goiter. Upper chest: Mucous plugging of an enlarged bronchus in the left upper lobe with distal tree-in-bud opacification Review of the MIP images confirms the above findings CTA HEAD FINDINGS Anterior circulation: Faint/thready opacification through the petrous carotid with complete occlusion of the ICA at the cavernous sinus. Non-opacification of the right proximal MCA and proximal right A1 ACA. More distal right A2 ACA and distal right MCA branches are likely opacified by collateral flow. Left ICA is patent with mild cavernous ICA stenosis due to atherosclerosis. Severe focal narrowing of the left M1 MCA with distal MCA opacification on the left. Left A1 ACA and left A2 ACA are patent. Posterior circulation: Bilateral intradural vertebral arteries, basilar artery and posterior cerebral arteries are patent. Multifocal moderate to severe stenosis of bilateral P2 PCAs. Venous sinuses: As permitted by contrast timing, patent. Review of the MIP images confirms the above findings CT Brain Perfusion Findings: ASPECTS: 9 CBF (<30%) Volume: 050mPerfusion (Tmax>6.0s) volume: 5848mismatch Volume: 71m29mfarction Location:[facility] IMPRESSION: 1. Severe atherosclerosis at the right carotid bifurcation with severe, nearly occlusive narrowing of the proximal ICA. In the more  distal ICA, there is only faint/thready opacification through the petrous carotid with complete occlusion of the ICA at the cavernous sinus. Non-opacification of the right M1  MCA and proximal right A1 ACA. More distal right A2 ACA and distal right MCA branches are likely opacified by collateral flow. 2. On perfusion imaging, there is 58 mL of penumbra in the posterior right MCA territory. While there are no recent priors to confirm, perfusion findings suggest that the aforementioned occlusion may be in part acute (possibly acute thrombosis superimposed on existing atherosclerosis/stenosis). No evidence of core infarct. 3. Severe narrowing of the left M1 MCA with distal opacification. 4. Multifocal moderate to severe stenosis of bilateral P2 PCAs. 5. Extensive left carotid bifurcation atherosclerosis without greater than 50% narrowing. 6. Mucous plugging of an enlarged bronchus in the left upper lobe with distal tree-in-bud opacification, suggesting underlying bronchial obstruction (either from fluid/debris or a bronchial lesion) and distal infectious/inflammatory process. Recommend follow-up chest CT in 2-3 months to further evaluate and ensure stability and/or resolution. 7. Heterogeneous thyroid with multiple large nodules, compatible with goiter. Consider follow-up thyroid ultrasound to further characterize. Critical findings discussed with Dr. Cheral Marker at 11:54 a.m. via telephone. Electronically Signed   By: Margaretha Sheffield MD   On: 12/24/2020 12:36   CT HEAD CODE STROKE WO CONTRAST  Result Date: 12/24/2020 CLINICAL DATA:  Code stroke.  Flaccid left-sided, facial droop. EXAM: CT HEAD WITHOUT CONTRAST TECHNIQUE: Contiguous axial images were obtained from the base of the skull through the vertex without intravenous contrast. COMPARISON:  CT head October 10, 2019. FINDINGS: Brain: new hypodensity in the high right parietal white matter (series 2, image 19) similar appearance of a perforator infarct in the  right basal ganglia extending and overlying corona radiata. Remote lacunar infarct in the left cerebellum. No acute hemorrhage. Patchy moderate white matter hypoattenuation, most likely related to chronic microvascular ischemic disease. No midline shift. No visible extra-axial fluid collection. No hydrocephalus. Similar appearance of a large partially calcified mass along the right middle cranial fossa with extra-axial mass infiltrating the skull base and infratemporal soft tissues, better characterized on remote MRI from 2011. Vascular: Calcific atherosclerosis. Poorly evaluated vessels at the skull base due to streak artifact. Skull: No acute fracture. Sinuses/Orbits: Similar apparent metal along the anterior aspect of the right globe, of unclear etiology. Surrounding streak artifact. Clear sinuses. Other: No mastoid effusions. ASPECTS [facility] Stroke Program Early CT Score) - Ganglionic level infarction (caudate, lentiform nuclei, internal capsule, insula, M1-M3 cortex): 7 - Supraganglionic infarction (M4-M6 cortex): 2 Total score (0-10 with 10 being normal): 9 Dr. Cheral Marker paged at 11:45 AM. Awaiting call back at the time of dictation. IMPRESSION: 1. New hypodensity in the high right parietal white matter (series 2, image 19), suggestive of age indeterminate but interval white matter infarct. MRI could further evaluate if the patient is able. ASPECTS is 9. 2. Moderate chronic microvascular ischemic disease and remote lacunar infarcts, as detailed above. 3. Similar appearance of a large partially calcified mass along the right middle cranial fossa with extra-axial mass infiltrating the skull base and infratemporal soft tissues, better characterized on remote MRI from 2011. Suspected surrounding edema in the temporal lobe, although evaluation is limited by streak artifact from apparent metal along the anterior right globe. Electronically Signed   By: Margaretha Sheffield MD   On: 12/24/2020 11:51   CT ANGIO HEAD  NECK W WO CM (CODE STROKE)  Result Date: 12/24/2020 CLINICAL DATA:  Neuro deficit, acute stroke suspected. EXAM: CT ANGIOGRAPHY HEAD AND NECK CT PERFUSION BRAIN TECHNIQUE: Multidetector CT imaging of the head and neck was performed using the standard protocol during bolus administration of intravenous  contrast. Multiplanar CT image reconstructions and MIPs were obtained to evaluate the vascular anatomy. Carotid stenosis measurements (when applicable) are obtained utilizing NASCET criteria, using the distal internal carotid diameter as the denominator. Multiphase CT imaging of the brain was performed following IV bolus contrast injection. Subsequent parametric perfusion maps were calculated using RAPID software. CONTRAST:  123m OMNIPAQUE IOHEXOL 350 MG/ML SOLN COMPARISON:  Same day CT head. FINDINGS: CTA NECK FINDINGS Aortic arch: Predominately calcific atherosclerosis of the aorta. Great vessel origins are patent. Right carotid system: Extensive calcific and noncalcific atherosclerosis at the carotid bifurcation. Severe, nearly occlusive atherosclerosis at the carotid bifurcation with only faint, predominately peripheral opacification in the ICA in the neck and faint, thready opacification at the skull base. Left carotid system: Calcific and noncalcific atherosclerosis at the carotid bifurcation with ulceration. No evidence of greater than 50% stenosis. Vertebral arteries: Mildly right dominant. Tortuous vertebral arteries bilaterally without hemodynamically significant stenosis. Skeleton: Moderate to severe multilevel degenerative disc disease. Multilevel facet/uncovertebral hypertrophy with varying degrees of neural foraminal stenosis. Other neck: Heterogeneous thyroid with multiple large nodules, compatible with goiter. Upper chest: Mucous plugging of an enlarged bronchus in the left upper lobe with distal tree-in-bud opacification Review of the MIP images confirms the above findings CTA HEAD FINDINGS Anterior  circulation: Faint/thready opacification through the petrous carotid with complete occlusion of the ICA at the cavernous sinus. Non-opacification of the right proximal MCA and proximal right A1 ACA. More distal right A2 ACA and distal right MCA branches are likely opacified by collateral flow. Left ICA is patent with mild cavernous ICA stenosis due to atherosclerosis. Severe focal narrowing of the left M1 MCA with distal MCA opacification on the left. Left A1 ACA and left A2 ACA are patent. Posterior circulation: Bilateral intradural vertebral arteries, basilar artery and posterior cerebral arteries are patent. Multifocal moderate to severe stenosis of bilateral P2 PCAs. Venous sinuses: As permitted by contrast timing, patent. Review of the MIP images confirms the above findings CT Brain Perfusion Findings: ASPECTS: 9 CBF (<30%) Volume: 073mPerfusion (Tmax>6.0s) volume: 5862mismatch Volume: 39m25mfarction Location:[facility] IMPRESSION: 1. Severe atherosclerosis at the right carotid bifurcation with severe, nearly occlusive narrowing of the proximal ICA. In the more distal ICA, there is only faint/thready opacification through the petrous carotid with complete occlusion of the ICA at the cavernous sinus. Non-opacification of the right M1 MCA and proximal right A1 ACA. More distal right A2 ACA and distal right MCA branches are likely opacified by collateral flow. 2. On perfusion imaging, there is 58 mL of penumbra in the posterior right MCA territory. While there are no recent priors to confirm, perfusion findings suggest that the aforementioned occlusion may be in part acute (possibly acute thrombosis superimposed on existing atherosclerosis/stenosis). No evidence of core infarct. 3. Severe narrowing of the left M1 MCA with distal opacification. 4. Multifocal moderate to severe stenosis of bilateral P2 PCAs. 5. Extensive left carotid bifurcation atherosclerosis without greater than 50% narrowing. 6. Mucous plugging of  an enlarged bronchus in the left upper lobe with distal tree-in-bud opacification, suggesting underlying bronchial obstruction (either from fluid/debris or a bronchial lesion) and distal infectious/inflammatory process. Recommend follow-up chest CT in 2-3 months to further evaluate and ensure stability and/or resolution. 7. Heterogeneous thyroid with multiple large nodules, compatible with goiter. Consider follow-up thyroid ultrasound to further characterize. Critical findings discussed with Dr. LindCheral Marker11:54 a.m. via telephone. Electronically Signed   By: FredMargaretha Sheffield  On: 12/24/2020 12:36    Procedures  Procedures  CRITICAL CARE Performed by: Wenda Overland Chares Slaymaker   Total critical care time: 30 minutes  Critical care time was exclusive of separately billable procedures and treating other patients.  Critical care was necessary to treat or prevent imminent or life-threatening deterioration.  Critical care was time spent personally by me on the following activities: development of treatment plan with patient and/or surrogate as well as nursing, discussions with consultants, evaluation of patient's response to treatment, examination of patient, obtaining history from patient or surrogate, ordering and performing treatments and interventions, ordering and review of laboratory studies, ordering and review of radiographic studies, pulse oximetry and re-evaluation of patient's condition.  Medications Ordered in ED Medications  sodium chloride flush (NS) 0.9 % injection 3 mL (has no administration in time range)  clevidipine (CLEVIPREX) 0.5 MG/ML infusion (has no administration in time range)  clevidipine (CLEVIPREX) infusion 0.5 mg/mL (1 mg/hr Intravenous New Bag/Given 12/24/20 1140)  iohexol (OMNIPAQUE) 350 MG/ML injection 100 mL (100 mLs Intravenous Contrast Given 12/24/20 1147)  aspirin 325 MG tablet (650 mg  Given 12/24/20 1216)    ED Course  I have reviewed the triage vital signs and  the nursing notes.  Pertinent labs & imaging results that were available during my care of the patient were reviewed by me and considered in my medical decision making (see chart for details).    MDM Rules/Calculators/A&P                          Arrived as code stroke and was immediately taken to [place] where she was met by the stroke team, Dr. Cheral Marker.  Not a candidate for tPA given timeline.  Head CT negative for hemorrhage.  Obtained CTA which shows several areas of severe narrowing of vasculature.  Dr. Cheral Marker spoke with family who requested no heroic measures including no IR intervention. Labs reassuring. Discussed admission w/ [facility], Dr. Earnest Conroy, who will admit.  Final Clinical Impression(s) / ED Diagnoses Final diagnoses:  Cerebrovascular accident (CVA), unspecified mechanism [facility])    Rx / [place] Orders ED Discharge Orders    None       Taneika Choi, Wenda Overland, MD 12/24/20 1434

## 2020-12-24 NOTE — ED Notes (Signed)
RN attempted report x1.  

## 2020-12-24 NOTE — ED Triage Notes (Signed)
Pt BIB EMS for CODE STROKE. Pt fell this morning. No known blood thinners. Pt has facial droop, slurred speech, and left sided weakness. Pt is alert and oriented x2.

## 2020-12-24 NOTE — Progress Notes (Addendum)
Brief Palliative Medicine Progress Note:  PMT consult received and chart reviewed.   Reached out to Dr. Earnest Conroy for patient updates. Dr. Earnest Conroy states she has had extensive conversations with patient/family today and feels PMT GOC and MOST form can be completed tomorrow 5/4.   PMT will plan to meet with patient/family tomorrow 5/4 for [place] and MOST form completion.   Thank you for this consult allowing PMT to assist in the care of this patient.  Ashten Prats M. Tamala Julian [facility] Palliative Medicine Team Team Phone: [phone] NO CHARGE

## 2020-12-24 NOTE — Progress Notes (Addendum)
Pt's Son in law called back states "patient is 80% blind, 80% hard of hearing, hallucinates sometimes and may try to wander off" and that "she has fallen before, and left a facility without checking out, and found me in the parking lot" Son-in-law stated " If I come visit her tonight she will become more agitated and insist on leaving" states " tell her the doctor is keeping her overnight and that I will come get her tomorrow"

## 2020-12-24 NOTE — ED Notes (Signed)
RN attempted report x3 

## 2020-12-24 NOTE — Progress Notes (Addendum)
Patient very agitated, Multiple attempts made to reassure patient and peform full assessment, pt non compliant with full assessment states "no one is doing anything for me" and that her " eyes burn and I need my eyedrops from my purse" When informed that her purse is safe with her son-in-law She repeats statements. MD notified.

## 2020-12-24 NOTE — ED Notes (Signed)
Pt removed external urinary catheter and was removing EKG leads as I entered the room. Pt had urinated in the bed, so I changed the sheets, performed pericare, put bedpads underneath her, and put her on a brief. Pt now resting with callbell within reach.

## 2020-12-24 NOTE — ED Notes (Signed)
Pt refuses cardiac monitor and vital signs. MD aware.

## 2020-12-24 NOTE — Consult Note (Addendum)
NEURO HOSPITALIST CONSULT NOTE   Requestig physician: Dr. Rex Kras  Reason for Consult: Acute onset of left sided weakness  History obtained from:  EMS and Chart     HPI:                                                                                                                                          Jessica Haynes is an 87 y.o. female with a history of right ear deafness, endometrial cancer, HLD, HTN, hypothyroidism, right middle cranial fossa meningioma, shingles and skin cancer who presents via EMS as a Code Stroke from home for new onset of left sided weakness. LKN was [id number] yesterday. Initially noted by EMS to have flaccid weakness and facial droop on the left. Her weakness improved to an extent en route and on arrival she could move all 4 extremities, but there was less spontaneous movement on the left compared to the right and the patient's head was preferentially rotated to the right. She has a history of right sided Bell's palsy; she has a gold implant in her right eyelid for treatment of eyelid closure weakness on the right. She has no prior history of stroke. She is estranged from her daughter. Her functioning health care POA is her son in law, with whom she has had a close relationship for many years. Home medications include ASA.   Past Medical History:  Diagnosis Date  . Anxiety   . Deafness in right ear   . Diverticulosis   . Endometrial cancer ([place]) 02/2013  . HEPATIC CYST 05/13/2007   Qualifier: Diagnosis of  By: [place], [place]    . Hyperlipidemia   . Hypertension   . Hypertrophy of kidney   . Hypothyroid   . Irritable bladder   . Macular degeneration   . Meningioma ([place])   . Shingles   . Skin cancer     Past Surgical History:  Procedure Laterality Date  . CATARACT EXTRACTION Bilateral   . COLONOSCOPY  11/2009   normal  . mastoid surgery    . TONSILLECTOMY    . uterine biopsy  06/2013    Family History  Problem Relation Age of  Onset  . Stroke Mother   . Breast cancer Sister        1 sister w/breast  . Breast cancer Daughter 56  . Colon cancer Neg Hx               Social History:  reports that she has never smoked. She has never used smokeless tobacco. She reports that she does not drink alcohol and does not use drugs.  Allergies  Allergen Reactions  . Clarithromycin     REACTION: causes swelling  . Neomycin     REACTION: causes itching and  redness  . Penicillins     REACTION: causes swelling    MEDICATIONS:                                                                                                                     No current facility-administered medications on file prior to encounter.   Current Outpatient Medications on File Prior to Encounter  Medication Sig Dispense Refill  . AMBULATORY NON FORMULARY MEDICATION Macular Dege Tabs Take 1 tablet by mouth twice a day    . aspirin 81 MG tablet Take 81 mg by mouth 3 (three) times a week.    . colestipol (COLESTID) 1 G tablet TAKE 2 TABLETS DAILY FOR DIARRHEA. TAKE 4 HOURS SEPARATE FROM OTHER MEDICATIONS. 60 tablet 0  . doxycycline (VIBRAMYCIN) 100 MG capsule Take 1 capsule (100 mg total) by mouth 2 (two) times daily. 20 capsule 0  . hydrochlorothiazide (HYDRODIURIL) 25 MG tablet TAKE 1 TABLET ONCE DAILY. 30 tablet 5  . levothyroxine (SYNTHROID, LEVOTHROID) 100 MCG tablet TAKE 1 TABLET ONCE DAILY. 30 tablet 5  . lisinopril (PRINIVIL,ZESTRIL) 20 MG tablet TAKE 1 TABLET ONCE DAILY. 30 tablet 5  . medroxyPROGESTERone (PROVERA) 10 MG tablet TAKE 1 TABLET ONCE DAILY. 90 tablet 3  . mupirocin ointment (BACTROBAN) 2 % Apply 1 application topically daily.    Marland Kitchen OVER THE COUNTER MEDICATION     . OVER THE COUNTER MEDICATION daily.    . sertraline (ZOLOFT) 100 MG tablet TAKE 1 TABLET ONCE DAILY. 90 tablet 3     ROS:                                                                                                                                       Unable  to obtain due to confusion and agitation.    Weight 63.2 kg.   General Examination:  Physical Exam  HEENT-  [place]/AT    Lungs- Respirations unlabored Extremities- Warm and well perfused  Neurological Examination Mental Status: Awake and alert. Speech is fluent. Able to follow only some commands - responds to most commands and questions with exclamations and short phrases expressing annoyance. Left hemineglect. She does not show evidence of awareness of her left sided deficits. Does not cooperate when asked orientation questions. Does exclaim about her daughter at one point during the exam, stating that daughter is trying to poison her.  Cranial Nerves: II: Does not cooperate with visual field testing. Keeps left eye completely to partially closed. Right pupil reactive and round, larger than left pupil; unable to further assess left pupil as patient shuts left eye when attempting to obtain pupillary light reflex.  III,IV, VI: Right eyelid closure is weak (chronic right Bells palsy). Keeps left eye completely to partially closed. Will briefly track to the left and right. Will fixate on examiner's face briefly.  V,VII: Weakness of right upper and lower quadrants of face (chronic right Bell's palsy). Reacts to touch bilaterally.  VIII: Hearing intact to voice IX,X: No hypophonia XI: Head is rotated preferentially to the right. This becomes gradually more pronounced after obtaining CTA and returning to her room in the ED.  XII: Does not protrude tongue to command.  Motor: RUE and RLE 4-5/5 within the context of poor cooperation. Can hold both antigravity without drift.  LUE and LLE with decreased spontaneous and volitional movement relative to the right, but could hold both antigravity briefly.  Sensory: Decreased reactivity to left sided sensory stimuli.  Deep Tendon Reflexes: 1+ and symmetric  throughout Plantars: Upgoing bilaterally, more briskly on the left.  Cerebellar: Does not follow commands for testing.  Gait: Deferred due to falls risk concerns   Lab Results: Basic Metabolic Panel: Recent Labs  Lab 12/24/20 1128  NA 143  K 4.4  CL 110  GLUCOSE 117*  BUN 30*  CREATININE 1.80*    CBC: Recent Labs  Lab 12/24/20 1128  WBC 8.6  NEUTROABS 6.8  HGB 12.1  11.2*  HCT 36.8  33.0*  MCV 93.9  PLT 177    Cardiac Enzymes: No results for input(s): CKTOTAL, CKMB, CKMBINDEX, TROPONINI in the last 168 hours.  Lipid Panel: No results for input(s): CHOL, TRIG, HDL, CHOLHDL, VLDL, LDLCALC in the last 168 hours.  Imaging:  CTA of head and neck: 1. Severe atherosclerosis at the right carotid bifurcation with severe, nearly occlusive narrowing of the proximal ICA. In the more distal ICA, there is only faint/thready opacification through the petrous carotid with complete occlusion of the ICA at the cavernous sinus. Non-opacification of the right M1 MCA and proximal right A1 ACA. More distal right A2 ACA and distal right MCA branches are likely opacified by collateral flow. 2. On perfusion imaging, there is 58 mL of penumbra in the posterior right MCA territory. While there are no recent priors to confirm, perfusion findings suggest that the aforementioned occlusion may be in part acute (possibly acute thrombosis superimposed on existing atherosclerosis/stenosis). No evidence of core infarct. 3. Severe narrowing of the left M1 MCA with distal opacification. 4. Multifocal moderate to severe stenosis of bilateral P2 PCAs. 5. Extensive left carotid bifurcation atherosclerosis without greater than 50% narrowing. 6. Mucous plugging of an enlarged bronchus in the left upper lobe with distal tree-in-bud opacification, suggesting underlying bronchial obstruction (either from fluid/debris or a bronchial lesion) and distal infectious/inflammatory process.  Recommend follow-up chest CT in 2-3 months  to further evaluate and ensure stability and/or resolution. 7. Heterogeneous thyroid with multiple large nodules, compatible with goiter. Consider follow-up thyroid ultrasound to further characterize.   Assessment: 87 year old female presenting with acute onset of left sided weakness.  1. Exam reveals findings are consistent with her history of right sided Bell's palsy, in addition to mild LUE weakness, decreased spontaneous left sided movement and left sided neglect. She has more trouble keeping her RLE elevated antigravity than her LLE. Requires frequent coaching to follow instructions during the exam. Speech is tangential.  2. CT head reveals new hypodensity in the high right parietal white matter, suggestive of age indeterminate but interval white matter infarct. ASPECTS is 9. Moderate chronic microvascular ischemic disease and remote lacunar infarcts are noted.  3. Also noted on CT head is similar appearance of a large partially calcified mass along the right middle cranial fossa with extra-axial mass infiltrating the skull base and infratemporal soft tissues, better characterized on remote MRI from 2011. Suspected surrounding edema in the temporal lobe, although evaluation is limited by streak artifact from apparent metal along the anterior right globe. She has a listed history of meningioma.  4. CTA of head and neck: Severe atherosclerosis at the right carotid bifurcation with severe, nearly occlusive narrowing of the proximal ICA. In the more distal ICA, there is only faint/thready opacification through the petrous carotid with complete occlusion of the ICA at the cavernous sinus. Non-opacification of the right M1 MCA and proximal right A1 ACA. More distal right A2 ACA and distal right MCA branches are likely opacified by collateral flow. Atherosclerotic changes are elsewhere noted, including severe narrowing of the left M1 MCA with distal  opacification, multifocal moderate to severe stenosis of bilateral P2 PCAs and extensive left carotid bifurcation atherosclerosis without greater than 50% narrowing. 5. CTP: On perfusion imaging, there is 58 mL of penumbra in the posterior right MCA territory. While there are no recent priors to confirm, perfusion findings suggest that the aforementioned occlusion may be in part acute (possibly acute thrombosis superimposed on existing atherosclerosis/stenosis). No evidence of core infarct.  6. Stroke risk factors: Advanced age, history of cancer, HLD and HTN 7. Out of the time window for tPA.  8. Discussed risk/benefits of mechanical thrombectomy with the patient's son in law, who is her healthcare POA. He states that she would not want to have invasive measures to manage any medical or surgical condition. After consideration, he has made the decision not to proceed with mechanical thrombectomy.   Recommendations: 1. HgbA1c, fasting lipid panel 2. MRI of the brain without contrast 3. PT consult, OT consult, Speech consult 4. TTE 5. Continue ASA. Administer 162 mg chewable tabs x 1 now. Add Plavix in AM for DAPT as she has failed monotherapy.  6. BP management with modified permissive HTN parameters for 24 hours. Treat SBP if > 180 7. Telemetry monitoring 8. Frequent neuro checks 9. NPO until passes stroke swallow screen 10. Palliative care consult   11. CTA also reveals mucous plugging of an enlarged bronchus in the left upper lobe with distal tree-in-bud opacification, suggesting underlying bronchial obstruction (either from fluid/debris or a bronchial lesion) and distal infectious/inflammatory process. Recommend follow-up chest CT in 2-3 months to further evaluate and ensure stability and/or resolution   Electronically signed: Dr. Kerney Elbe 12/24/2020, 11:43 AM

## 2020-12-24 NOTE — Consult Note (Addendum)
Consultation Note Date: 12/24/2020   Patient Name: Jessica Haynes  DOB: 23-Apr-1923  MRN: [id number]  Age / Sex: 87 y.o., female  PCP: Maurice Small, MD Referring Physician: Guilford Shi, MD  Reason for Consultation: Establishing goals of care, "Goals of care, MOST form"  HPI/Patient Profile: 88 y.o. female  with past medical history of deafness in right ear, near complete blindness due to macular degeneration, hypertension, hyperlipidemia, hypothyroidism, right facial droop due to Bell's palsy, and endometrial cancer in 2014 presented to [facility] ED on 12/24/2020 from [facility] after a fall and family concerns of left facial droop, slurred speech, left-sided weakness.  Code stroke was called and neurology consulted. Dr. Cheral Marker spoke with family who requested no heroic measures including no IR intervention. Patient was admitted on 12/24/2020 with acute ischemic right MCA stroke, carotid stenosis, abnormal CT of neck.   ED course: Sodium 142, K4.3, BUN 22, creatinine 1.6-1.8 (baseline), CBC within normal limits, CT head reported New hypodensity in the high right parietal white matter suggestive of age indeterminate but interval white matter infarct. MRI could further evaluate if the patient is able, moderate chronic microvascular ischemic disease. Similar appearance of a large partially calcified mass along the right middle cranial fossa with extra-axial mass infiltrating the skull base and infratemporal soft tissues, better characterized on remote MRI from 2011.   CT perfusion brain, CTA of head/neck showed Severe atherosclerosis at the right carotid bifurcation with severe, nearly occlusive narrowing of the proximal ICA. In the more distal ICA, there is only faint/thready opacification through the petrous carotid with complete occlusion of the ICA at the cavernous sinus.Possibly acute thrombosis superimposed on existing  atherosclerosis/stenosis of right MCA territory. Severe narrowing of the left M1 MCA with distal opacification. Multifocal moderate to severe stenosis of bilateral P2 PCAs.  Patient evaluated by neurology and recommended medical admission with no aggressive interventions/thrombectomy/anticoagulation as per discussion with patient's son.   Clinical Assessment and Goals of Care: I have reviewed medical records including EPIC notes, labs, and imaging. Received report from primary RN - no acute concerns. Per RN, patient is confused, trying to get out of bed, and too restless to have MRI completed without sedation.    Went to visit patient at bedside - no family/visitors present. Patient was lying in bed awake, alert, disoriented x4, and able to participate in minimal conversation - she does not answer questions appropriately, does not make eye contact, but states "Get me out of here." She is not able to make complex medical decisions. No signs or non-verbal gestures of pain or discomfort noted. No respiratory distress, increased work of breathing, or secretions noted.  Called son-in-law/Glenn - he states he is almost to the hospital - plan to meet in person on his arrival  10:30 AM Met with son-in-law/Glenn to discuss diagnosis, prognosis, GOC, EOL wishes, disposition, and options.  I introduced Palliative Medicine as specialized medical care for people living with serious illness. It focuses on providing relief from the symptoms and stress of a serious illness.  The goal is to improve quality of life for both the patient and the family.  We discussed a brief life review of the patient as well as functional and nutritional status. Jessica Haynes was described as someone who likes to decorate and was also known to be outspoken and very independent.  She was married, unfortunately her husband passed away 15 years ago.  They have 1 daughter together; however, patient and daughter have had a strained  relationship.  Son-in-law states patient and daughter do not get along, which is why he has assisted patient with any of her needs over the past many years.  [name] states that his wife/patient's daughter does not want to have any part of the decision-making for patient.  Prior to hospitalization, patient was living at [facility] independent living.  Despite being 80% blind due to macular degeneration she was able to ambulate with a cane, dress and bathe herself, and could walk around shopping centers using shopping cart as support.  Patient no longer drives.  [name] states patient has refused all home health aide services offered to her in the past.  [name] states patient has had a good appetite, eating and drinking well -she is able to cook herself TV dinners and soup.  [name] states he has noticed a gradual decline in the patient over the last 2 years and attributes this decline due to being socially isolated due to [place].  [name] states that he has noticed over the past several months the patient has been hallucinating.  [name] feels patient has had signs/symptoms of possible mini strokes for a long time.  We discussed patient's current illness and what it means in the larger context of patient's on-going co-morbidities.  Natural disease trajectory and expectations at EOL were discussed. I attempted to elicit values and goals of care important to the patient. The difference between aggressive medical intervention and comfort care was considered in light of the patient's goals of care. [name] states he has had conversations with patient in the past where she has stated if she was no longer able to walk she would not consider that a quality of life and would not want her life prolonged. We talked about transition to comfort measures in house and what that would entail inclusive of medications to control pain, dyspnea, agitation, nausea, itching, and hiccups. We discussed stopping all unnecessary measures such as  blood draws, needle sticks, oxygen, antibiotics, CBGs/insulin, cardiac monitoring, and frequent vital signs.  [name] is agreeable for transition to full comfort measures in house today.  Provided education and counseling at length on the philosophy and benefits of hospice care. Discussed that it offers a holistic approach to care in the setting of end-stage illness and is about supporting the patient where they are allowing nature to take it's course. Discussed the hospice team includes RNs, physicians, social workers, and chaplains. They can provide personal care, support for the family, and help keep patient out of the hospital.  Residential hospice versus home hospice was reviewed in detail -[name] understands that patient does not yet meet residential hospice criteria, therefore home hospice is recommended.  Educated that patient will need 24/7 supervision/assistance once discharged. We discussed disposition options with hospice in detail to include the possibility of returning to [facility] with private duty caregivers versus live with family versus LTC.  Briefly discussed these options in context of financial resources -[name] explains that financial resources are limited.  He expresses that due to patient and daughter's strained relationship,  patient living with family is not an option.  He also feels if patient was transferred to [facility] facility she would "hate it and beg to come home every day."  His first choice would be for patient to return to [facility] with private duty caregivers and hospice support.  He is open to LTC with hospice; however, feels financially this might not be possible.  He is open to continue these discussions with TOC.  [name] states [facility] has partnered with private duty Hotel manager and he is agreeable to utilize [employer] has a partnership with.  Glenn's main symptom he would like addressed is patient's  anxiety/agitation/restlessness. Visit consisted of discussions dealing with the complex and emotionally intense issues of symptom management and palliative care in the setting of acute medical situation and end-of-life. Palliative care team will continue to support patient, patient's family, and medical team.  Advance directives were considered and discussed.  [name] tells me that he has encouraged patient to complete advance directive documents many times in the past, but unfortunately patient refused the completion of these documents each time.  [name] confirms code status of DNR/DNI.  Discussed with patient/family the importance of continued conversation with each other and the medical providers regarding overall plan of care and treatment options, ensuring decisions are within the context of the patient's values and GOCs.    Questions and concerns were addressed. The patient/family was encouraged to call with questions and/or concerns. PMT card was provided.  Copies made of patient's insurance cards and driver's license -gave to [facility].  3:30 PM Notified by PMT RN that son-in-law/Glenn called with disposition concerns/financial concerns from information he had received from [facility].  Discussed concerns with TOC. I appreciate their assistance.  Primary Decision Maker: NEXT OF KIN son-in-law/Glenn Rosana Berger    SUMMARY OF RECOMMENDATIONS Initiated full comfort measures Continue DNR/DNI as previously documented - durable DNR form completed and placed in shadow chart; copy made and will be scanned into Vynca/ACP tab Son-in-law would like patient discharged back to independent living at [facility] with private duty caregivers and hospice. He would also like more information on cost of private duty caregivers. Son-in-law is open to LTC with hospice; however, is uncertain if financially this option is feasible -financial resources are limited. TOC notified and consult placed  Added orders for  EOL symptom management and to reflect full comfort measures, as well as discontinued orders that were not focused on comfort Modified Zyprexa order to 63m PO qHS for patient's agitation Will follow-up with son-in-law tomorrow 5/5 wishes on discontinuing/continuing aspirin, plavix, and synthroid  Unrestricted visitation orders were placed per current [facility] visitation policy  Provide frequent assessments and administer PRN medications as clinically necessary to ensure EOL comfort PMT will continue to follow holistically   Code Status/Advance Care Planning:  DNR   Palliative Prophylaxis:   Aspiration, Bowel Regimen, Delirium Protocol, Frequent Pain Assessment, Oral Care and Turn Reposition  Additional Recommendations (Limitations, Scope, Preferences):  Full Comfort Care  Psycho-social/Spiritual:   Desire for further Chaplaincy support:no Created space and opportunity for patient and family to express thoughts and feelings regarding patient's current medical situation.   Emotional support and therapeutic listening provided.  Prognosis:   < 6 months  Discharge Planning: Home with Hospice       Primary Diagnoses: Present on Admission: . TIA (transient ischemic attack)   I have reviewed the medical record, interviewed the patient and family, and examined the patient. The following  aspects are pertinent.  Past Medical History:  Diagnosis Date  . Anxiety   . Deafness in right ear   . Diverticulosis   . Endometrial cancer ([place]) 02/2013  . HEPATIC CYST 05/13/2007   Qualifier: Diagnosis of  By: [place], [place]    . Hyperlipidemia   . Hypertension   . Hypertrophy of kidney   . Hypothyroid   . Irritable bladder   . Macular degeneration   . Meningioma ([place])   . Shingles   . Skin cancer    Social History   Socioeconomic History  . Marital status: Widowed    Spouse name: Not on file  . Number of children: 1  . Years of education: Not on file  .  Highest education level: Not on file  Occupational History  . Occupation: Housewife  Tobacco Use  . Smoking status: Never Smoker  . Smokeless tobacco: Never Used  Substance and Sexual Activity  . Alcohol use: No  . Drug use: No  . Sexual activity: Not on file    Comment: G1 P1, menopause in 50's  Other Topics Concern  . Not on file  Social History Narrative   Recently widowed after 42 years   She just recently sold off many household goods and had a finality to her loss.   Lives in independent living at the [facility] DNR patient would want mechanical ventilation, dialysis artificial nutrition and hydration as long as she maintained cognitive function. She states she would not want major surgery.   Social Determinants of Health   Financial Resource Strain: Not on file  Food Insecurity: Not on file  Transportation Needs: Not on file  Physical Activity: Not on file  Stress: Not on file  Social Connections: Not on file   Family History  Problem Relation Age of Onset  . Stroke Mother   . Breast cancer Sister        1 sister w/breast  . Breast cancer Daughter 61  . Colon cancer Neg Hx    Scheduled Meds: .  stroke: mapping our early stages of recovery book   Does not apply Once  . aspirin  162 mg Oral Daily  . colestipol  1 g Oral Daily  . enoxaparin (LOVENOX) injection  30 mg Subcutaneous Q24H  . levothyroxine  100 mcg Oral Daily  . lisinopril  20 mg Oral Daily  . sertraline  100 mg Oral Daily  . sodium chloride flush  3 mL Intravenous Once   Continuous Infusions: . clevidipine    . clevidipine Stopped (12/24/20 1444)   PRN Meds:.acetaminophen **OR** acetaminophen (TYLENOL) oral liquid 160 mg/5 mL **OR** acetaminophen, senna-docusate Medications Prior to Admission:  Prior to Admission medications   Medication Sig Start Date End Date Taking? Authorizing Provider  AMBULATORY NON FORMULARY MEDICATION Macular Dege Tabs Take 1 tablet by mouth twice a day     [provider]  aspirin 81 MG tablet Take 81 mg by mouth 3 (three) times a week.    [provider]  colestipol (COLESTID) 1 G tablet TAKE 2 TABLETS DAILY FOR DIARRHEA. TAKE 4 HOURS SEPARATE FROM OTHER MEDICATIONS. 03/07/15   Pyrtle, Lajuan Lines, MD  doxycycline (VIBRAMYCIN) 100 MG capsule Take 1 capsule (100 mg total) by mouth 2 (two) times daily. 12/31/17   Jaynee Eagles, PA-C  hydrochlorothiazide (HYDRODIURIL) 25 MG tablet TAKE 1 TABLET ONCE DAILY. 10/30/13   Norins, Heinz Knuckles, MD  levothyroxine (SYNTHROID, LEVOTHROID) 100 MCG tablet TAKE 1  TABLET ONCE DAILY. 10/30/13   Norins, Heinz Knuckles, MD  lisinopril (PRINIVIL,ZESTRIL) 20 MG tablet TAKE 1 TABLET ONCE DAILY. 10/30/13   Norins, Heinz Knuckles, MD  medroxyPROGESTERone (PROVERA) 10 MG tablet TAKE 1 TABLET ONCE DAILY. 12/31/15   Joylene John D, NP  mupirocin ointment (BACTROBAN) 2 % Apply 1 application topically daily.    [provider]  OVER THE COUNTER MEDICATION     [provider]  OVER THE COUNTER MEDICATION daily.    [provider]  sertraline (ZOLOFT) 100 MG tablet TAKE 1 TABLET ONCE DAILY. 06/07/13   Norins, Heinz Knuckles, MD   Allergies  Allergen Reactions  . Clarithromycin     REACTION: causes swelling  . Neomycin     REACTION: causes itching and redness  . Penicillins     REACTION: causes swelling   Review of Systems  Unable to perform ROS: Acuity of condition    Physical Exam Vitals and nursing note reviewed.  Constitutional:      General: She is not in acute distress.    Appearance: She is ill-appearing.  Pulmonary:     Effort: No respiratory distress.  Skin:    General: Skin is warm and dry.  Neurological:     Mental Status: She is alert. She is disoriented and confused.     Motor: Weakness present.  Psychiatric:        Mood and Affect: Mood is anxious.        Behavior: Behavior is agitated.        Cognition and Memory: Cognition is impaired. Memory is impaired.     Vital Signs: BP  (!) 147/55   Pulse 84   Temp 98.8 F (37.1 C) (Oral)   Resp 16   Wt 63.2 kg   SpO2 98%   BMI 24.68 kg/m          SpO2: SpO2: 98 % O2 Device:SpO2: 98 % O2 Flow Rate: .   IO: Intake/output summary:   Intake/Output Summary (Last 24 hours) at 12/24/2020 1529 Last data filed at 12/24/2020 1444 Gross per 24 hour  Intake 5.88 ml  Output --  Net 5.88 ml    LBM:   Baseline Weight: Weight: 63.2 kg Most recent weight: Weight: 63.2 kg     Palliative Assessment/Data: PPS 30%     Time In: 1030 Time Out: 1215 Time Total: 105 minutes  Greater than 50%  of this time was spent counseling and coordinating care related to the above assessment and plan.  Signed by: Lin Landsman, NP   Please contact Palliative Medicine Team phone at [phone] for questions and concerns.  For individual provider: See Amion  *Portions of this note are a verbal dictation therefore any spelling and/or grammatical errors are due to the "[employer]" system interpretation.

## 2020-12-24 NOTE — H&P (Addendum)
History and Physical    DOA: 12/24/2020  PCP: Maurice Small, MD  Patient coming from: Independent living facility  Chief Complaint: left sided weakness as noted by son-in-law  HPI: Jessica Haynes is a 87 y.o. female with history h/o hypertension, hyperlipidemia, hypothyroidism, near complete blindness due to macular degeneration, deafness in right ear, right facial droop due to Bell's palsy, endometrial CA, chronic bladder incontinence and cognitive dysfunction at baseline, who resides at independent living facility presents to the ED after family noted left-sided weakness. Patient apparently fell twice at ILF  today while trying to walk in the room and dragged herself to the room door calling for help.  EMS was summoned and patient was advised to be brought to the ED, she however declined to come to the hospital and EMS left.  Son-in-law subsequently went to help her and noted that she was not able to move her left side when he tried to get her out of the chair and walk with a walker.  She does have chronic left shoulder arthritis/pain but apparently had intra-articular shot by Dr. Justin Mend last week after which she has been able to move this arm quite well per son-in-law.  She does have chronic right facial droop but left-sided weakness per family is new which brought her to the ED.  According to family, patient does have occasional confusion and lately also been having auditory and visual hallucinations at times.  During my interview, patient initially asleep but woke up and able to communicate appropriately-stated she only has 1 daughter and relies mostly on son-in-law for help and does not want daughter to be involved in healthcare decisions.  She reports some throbbing pain along right thigh and chronic left shoulder pain since she fell 1.5 years back.  She seems to have right gaze preference.  She denies feeling weak on the left side and states "my brain is very strong, I do not think I have a stroke,  I need to get out of here and go home".  ED course: Blood pressure 202/77, heart rate 90-104, labs showed sodium 142, K4.3, BUN 22, creatinine 1.6-1.8 (baseline), CBC within normal limits, CT head reported New hypodensity in the high right parietal white matter suggestive of age indeterminate but interval white matter infarct. MRI could further evaluate if the patient is able, moderate chronic microvascular ischemic disease. Similar appearance of a large partially calcified mass along the right middle cranial fossa with extra-axial mass infiltrating the skull base and infratemporal soft tissues, better characterized on remote MRI from 2011.   CT perfusion brain, CTA of head/neck showed Severe atherosclerosis at the right carotid bifurcation with severe, nearly occlusive narrowing of the proximal ICA. In the more distal ICA, there is only faint/thready opacification through the petrous carotid with complete occlusion of the ICA at the cavernous sinus.Possibly acute thrombosis superimposed on existing atherosclerosis/stenosis of right MCA territory.Severe narrowing of the left M1 MCA with distal opacification. Multifocal moderate to severe stenosis of bilateral P2 PCAs.  Patient evaluated by neurology and recommended medical admission with no aggressive interventions/thrombectomy/anticoagulation as per discussion with patient's son.  She received aspirin 650 mg and was started on Cleviprex drip in the ED per neurology recommendations to keep SBP around 180.  Review of Systems: As per HPI, otherwise review of systems negative.    Past Medical History:  Diagnosis Date  . Anxiety   . Deafness in right ear   . Diverticulosis   . Endometrial cancer ([place]) 02/2013  .  HEPATIC CYST 05/13/2007   Qualifier: Diagnosis of  By: [place], [place]    . Hyperlipidemia   . Hypertension   . Hypertrophy of kidney   . Hypothyroid   . Irritable bladder   . Macular degeneration   . Meningioma ([place])   . Shingles   .  Skin cancer     Past Surgical History:  Procedure Laterality Date  . CATARACT EXTRACTION Bilateral   . COLONOSCOPY  11/2009   normal  . mastoid surgery    . TONSILLECTOMY    . uterine biopsy  06/2013    Social history:  reports that she has never smoked. She has never used smokeless tobacco. She reports that she does not drink alcohol and does not use drugs.   Allergies  Allergen Reactions  . Clarithromycin     REACTION: causes swelling  . Neomycin     REACTION: causes itching and redness  . Penicillins     REACTION: causes swelling    Family History  Problem Relation Age of Onset  . Stroke Mother   . Breast cancer Sister        1 sister w/breast  . Breast cancer Daughter 103  . Colon cancer Neg Hx       Prior to Admission medications   Medication Sig Start Date End Date Taking? Authorizing Provider  AMBULATORY NON FORMULARY MEDICATION Macular Dege Tabs Take 1 tablet by mouth twice a day    [provider]  aspirin 81 MG tablet Take 81 mg by mouth 3 (three) times a week.    [provider]  colestipol (COLESTID) 1 G tablet TAKE 2 TABLETS DAILY FOR DIARRHEA. TAKE 4 HOURS SEPARATE FROM OTHER MEDICATIONS. 03/07/15   Pyrtle, Lajuan Lines, MD  doxycycline (VIBRAMYCIN) 100 MG capsule Take 1 capsule (100 mg total) by mouth 2 (two) times daily. 12/31/17   Jaynee Eagles, PA-C  hydrochlorothiazide (HYDRODIURIL) 25 MG tablet TAKE 1 TABLET ONCE DAILY. 10/30/13   Norins, Heinz Knuckles, MD  levothyroxine (SYNTHROID, LEVOTHROID) 100 MCG tablet TAKE 1 TABLET ONCE DAILY. 10/30/13   Norins, Heinz Knuckles, MD  lisinopril (PRINIVIL,ZESTRIL) 20 MG tablet TAKE 1 TABLET ONCE DAILY. 10/30/13   Norins, Heinz Knuckles, MD  medroxyPROGESTERone (PROVERA) 10 MG tablet TAKE 1 TABLET ONCE DAILY. 12/31/15   Joylene John D, NP  mupirocin ointment (BACTROBAN) 2 % Apply 1 application topically daily.    [provider]  OVER THE COUNTER MEDICATION     [provider]  OVER THE COUNTER MEDICATION  daily.    [provider]  sertraline (ZOLOFT) 100 MG tablet TAKE 1 TABLET ONCE DAILY. 06/07/13   Neena Rhymes, MD    Physical Exam: Vitals:   12/24/20 1445 12/24/20 1500 12/24/20 1515 12/24/20 1530  BP: (!) 135/50 (!) 147/55 (!) 162/72 (!) 141/106  Pulse: 84 84 94 92  Resp: 14 16 20  (!) 22  Temp:      TempSrc:      SpO2: 99% 98% 100% 98%  Weight:        Constitutional: Sleeping comfortably, easily arousable and able to participate in interview/exam-although limited due to hard of hearing and right gaze preference Head ENMT: Right facial droop with ptosis, mucous membranes are moist. Neck: normal, supple, no masses, no thyromegaly Respiratory: clear to auscultation bilaterally, no wheezing, no crackles. Normal respiratory effort. No accessory muscle use.  Cardiovascular: Regular rate and rhythm, no murmurs / rubs / gallops. No extremity edema. 2+ pedal pulses. No carotid bruits.  Abdomen: no tenderness, no masses palpated. No hepatosplenomegaly. Bowel sounds positive.  Musculoskeletal:No joint deformity upper and lower extremities.  Decreased range of motion along the left shoulder joint (chronic per patient) Neurologic: Chronic right facial droop, currently appears to have right gaze preference, poor handgrip on the left side compared to right, reduced muscle tone in left upper extremity (not baseline per son-in-law), able to move left lower extremity against gravity but appears somewhat weak compared to right side. Psychiatric: Anxious,  oriented x 3 currently.  Prefers to go home SKIN/catheters: no rashes, lesions, ulcers. No induration  Labs on Admission: I have personally reviewed following labs and imaging studies  CBC: Recent Labs  Lab 12/24/20 1128  WBC 8.6  NEUTROABS 6.8  HGB 12.1  11.2*  HCT 36.8  33.0*  MCV 93.9  PLT 614   Basic Metabolic Panel: Recent Labs  Lab 12/24/20 1128  NA 142  143  K 4.3  4.4  CL 110  110  CO2 25  GLUCOSE 122*   117*  BUN 22  30*  CREATININE 1.66*  1.80*  CALCIUM 9.4   GFR: CrCl cannot be calculated (Unknown ideal weight.). Recent Labs  Lab 12/24/20 1128  WBC 8.6   Liver Function Tests: Recent Labs  Lab 12/24/20 1128  AST 24  ALT 11  ALKPHOS 48  BILITOT 0.9  PROT 6.3*  ALBUMIN 3.8   No results for input(s): LIPASE, AMYLASE in the last 168 hours. No results for input(s): AMMONIA in the last 168 hours. Coagulation Profile: Recent Labs  Lab 12/24/20 1128  INR 1.0   Cardiac Enzymes: No results for input(s): CKTOTAL, CKMB, CKMBINDEX, TROPONINI in the last 168 hours. BNP (last 3 results) No results for input(s): PROBNP in the last 8760 hours. HbA1C: No results for input(s): HGBA1C in the last 72 hours. CBG: Recent Labs  Lab 12/24/20 1124  GLUCAP 113*   Lipid Profile: No results for input(s): CHOL, HDL, LDLCALC, TRIG, CHOLHDL, LDLDIRECT in the last 72 hours. Thyroid Function Tests: No results for input(s): TSH, T4TOTAL, FREET4, T3FREE, THYROIDAB in the last 72 hours. Anemia Panel: No results for input(s): VITAMINB12, FOLATE, FERRITIN, TIBC, IRON, RETICCTPCT in the last 72 hours. Urine analysis:    Component Value Date/Time   COLORURINE YELLOW 12/04/2009 1103   APPEARANCEUR TURBID (A) 12/04/2009 1103   LABSPEC 1.020 12/04/2009 1103   PHURINE 5.0 12/04/2009 1103   GLUCOSEU NEGATIVE 12/04/2009 1103   HGBUR LARGE (A) 12/04/2009 1103   HGBUR trace-intact 10/19/2007 1515   BILIRUBINUR small (A) 12/09/2017 0940   KETONESUR small (15) (A) 12/09/2017 0940   KETONESUR NEGATIVE 12/04/2009 1103   PROTEINUR =100 (A) 12/09/2017 0940   PROTEINUR NEGATIVE 12/04/2009 1103   UROBILINOGEN 0.2 12/09/2017 0940   UROBILINOGEN 0.2 12/04/2009 1103   NITRITE Negative 12/09/2017 0940   NITRITE NEGATIVE 12/04/2009 1103   LEUKOCYTESUR Small (1+) (A) 12/09/2017 0940    Radiological Exams on Admission: Personally reviewed  CT CEREBRAL PERFUSION W CONTRAST  Result Date:  12/24/2020 CLINICAL DATA:  Neuro deficit, acute stroke suspected. EXAM: CT ANGIOGRAPHY HEAD AND NECK CT PERFUSION BRAIN TECHNIQUE: Multidetector CT imaging of the head and neck was performed using the standard protocol during bolus administration of intravenous contrast. Multiplanar CT image reconstructions and MIPs were obtained to evaluate the vascular anatomy. Carotid stenosis measurements (when applicable) are obtained utilizing NASCET criteria, using the distal internal carotid diameter as the denominator. Multiphase CT imaging of the brain was performed following IV bolus contrast injection. Subsequent parametric  perfusion maps were calculated using RAPID software. CONTRAST:  119mL OMNIPAQUE IOHEXOL 350 MG/ML SOLN COMPARISON:  Same day CT head. FINDINGS: CTA NECK FINDINGS Aortic arch: Predominately calcific atherosclerosis of the aorta. Great vessel origins are patent. Right carotid system: Extensive calcific and noncalcific atherosclerosis at the carotid bifurcation. Severe, nearly occlusive atherosclerosis at the carotid bifurcation with only faint, predominately peripheral opacification in the ICA in the neck and faint, thready opacification at the skull base. Left carotid system: Calcific and noncalcific atherosclerosis at the carotid bifurcation with ulceration. No evidence of greater than 50% stenosis. Vertebral arteries: Mildly right dominant. Tortuous vertebral arteries bilaterally without hemodynamically significant stenosis. Skeleton: Moderate to severe multilevel degenerative disc disease. Multilevel facet/uncovertebral hypertrophy with varying degrees of neural foraminal stenosis. Other neck: Heterogeneous thyroid with multiple large nodules, compatible with goiter. Upper chest: Mucous plugging of an enlarged bronchus in the left upper lobe with distal tree-in-bud opacification Review of the MIP images confirms the above findings CTA HEAD FINDINGS Anterior circulation: Faint/thready opacification  through the petrous carotid with complete occlusion of the ICA at the cavernous sinus. Non-opacification of the right proximal MCA and proximal right A1 ACA. More distal right A2 ACA and distal right MCA branches are likely opacified by collateral flow. Left ICA is patent with mild cavernous ICA stenosis due to atherosclerosis. Severe focal narrowing of the left M1 MCA with distal MCA opacification on the left. Left A1 ACA and left A2 ACA are patent. Posterior circulation: Bilateral intradural vertebral arteries, basilar artery and posterior cerebral arteries are patent. Multifocal moderate to severe stenosis of bilateral P2 PCAs. Venous sinuses: As permitted by contrast timing, patent. Review of the MIP images confirms the above findings CT Brain Perfusion Findings: ASPECTS: 9 CBF (<30%) Volume: 97mL Perfusion (Tmax>6.0s) volume: 48mL Mismatch Volume: 5mL Infarction Location:[facility] IMPRESSION: 1. Severe atherosclerosis at the right carotid bifurcation with severe, nearly occlusive narrowing of the proximal ICA. In the more distal ICA, there is only faint/thready opacification through the petrous carotid with complete occlusion of the ICA at the cavernous sinus. Non-opacification of the right M1 MCA and proximal right A1 ACA. More distal right A2 ACA and distal right MCA branches are likely opacified by collateral flow. 2. On perfusion imaging, there is 58 mL of penumbra in the posterior right MCA territory. While there are no recent priors to confirm, perfusion findings suggest that the aforementioned occlusion may be in part acute (possibly acute thrombosis superimposed on existing atherosclerosis/stenosis). No evidence of core infarct. 3. Severe narrowing of the left M1 MCA with distal opacification. 4. Multifocal moderate to severe stenosis of bilateral P2 PCAs. 5. Extensive left carotid bifurcation atherosclerosis without greater than 50% narrowing. 6. Mucous plugging of an enlarged bronchus in the left upper  lobe with distal tree-in-bud opacification, suggesting underlying bronchial obstruction (either from fluid/debris or a bronchial lesion) and distal infectious/inflammatory process. Recommend follow-up chest CT in 2-3 months to further evaluate and ensure stability and/or resolution. 7. Heterogeneous thyroid with multiple large nodules, compatible with goiter. Consider follow-up thyroid ultrasound to further characterize. Critical findings discussed with Dr. Cheral Marker at 11:54 a.m. via telephone. Electronically Signed   By: Margaretha Sheffield MD   On: 12/24/2020 12:36   CT HEAD CODE STROKE WO CONTRAST  Result Date: 12/24/2020 CLINICAL DATA:  Code stroke.  Flaccid left-sided, facial droop. EXAM: CT HEAD WITHOUT CONTRAST TECHNIQUE: Contiguous axial images were obtained from the base of the skull through the vertex without intravenous contrast. COMPARISON:  CT head October 10, 2019.  FINDINGS: Brain: new hypodensity in the high right parietal white matter (series 2, image 19) similar appearance of a perforator infarct in the right basal ganglia extending and overlying corona radiata. Remote lacunar infarct in the left cerebellum. No acute hemorrhage. Patchy moderate white matter hypoattenuation, most likely related to chronic microvascular ischemic disease. No midline shift. No visible extra-axial fluid collection. No hydrocephalus. Similar appearance of a large partially calcified mass along the right middle cranial fossa with extra-axial mass infiltrating the skull base and infratemporal soft tissues, better characterized on remote MRI from 2011. Vascular: Calcific atherosclerosis. Poorly evaluated vessels at the skull base due to streak artifact. Skull: No acute fracture. Sinuses/Orbits: Similar apparent metal along the anterior aspect of the right globe, of unclear etiology. Surrounding streak artifact. Clear sinuses. Other: No mastoid effusions. ASPECTS [facility] Stroke Program Early CT Score) - Ganglionic level  infarction (caudate, lentiform nuclei, internal capsule, insula, M1-M3 cortex): 7 - Supraganglionic infarction (M4-M6 cortex): 2 Total score (0-10 with 10 being normal): 9 Dr. Cheral Marker paged at 11:45 AM. Awaiting call back at the time of dictation. IMPRESSION: 1. New hypodensity in the high right parietal white matter (series 2, image 19), suggestive of age indeterminate but interval white matter infarct. MRI could further evaluate if the patient is able. ASPECTS is 9. 2. Moderate chronic microvascular ischemic disease and remote lacunar infarcts, as detailed above. 3. Similar appearance of a large partially calcified mass along the right middle cranial fossa with extra-axial mass infiltrating the skull base and infratemporal soft tissues, better characterized on remote MRI from 2011. Suspected surrounding edema in the temporal lobe, although evaluation is limited by streak artifact from apparent metal along the anterior right globe. Electronically Signed   By: Margaretha Sheffield MD   On: 12/24/2020 11:51   CT ANGIO HEAD NECK W WO CM (CODE STROKE)  Result Date: 12/24/2020 CLINICAL DATA:  Neuro deficit, acute stroke suspected. EXAM: CT ANGIOGRAPHY HEAD AND NECK CT PERFUSION BRAIN TECHNIQUE: Multidetector CT imaging of the head and neck was performed using the standard protocol during bolus administration of intravenous contrast. Multiplanar CT image reconstructions and MIPs were obtained to evaluate the vascular anatomy. Carotid stenosis measurements (when applicable) are obtained utilizing NASCET criteria, using the distal internal carotid diameter as the denominator. Multiphase CT imaging of the brain was performed following IV bolus contrast injection. Subsequent parametric perfusion maps were calculated using RAPID software. CONTRAST:  163mL OMNIPAQUE IOHEXOL 350 MG/ML SOLN COMPARISON:  Same day CT head. FINDINGS: CTA NECK FINDINGS Aortic arch: Predominately calcific atherosclerosis of the aorta. Great vessel  origins are patent. Right carotid system: Extensive calcific and noncalcific atherosclerosis at the carotid bifurcation. Severe, nearly occlusive atherosclerosis at the carotid bifurcation with only faint, predominately peripheral opacification in the ICA in the neck and faint, thready opacification at the skull base. Left carotid system: Calcific and noncalcific atherosclerosis at the carotid bifurcation with ulceration. No evidence of greater than 50% stenosis. Vertebral arteries: Mildly right dominant. Tortuous vertebral arteries bilaterally without hemodynamically significant stenosis. Skeleton: Moderate to severe multilevel degenerative disc disease. Multilevel facet/uncovertebral hypertrophy with varying degrees of neural foraminal stenosis. Other neck: Heterogeneous thyroid with multiple large nodules, compatible with goiter. Upper chest: Mucous plugging of an enlarged bronchus in the left upper lobe with distal tree-in-bud opacification Review of the MIP images confirms the above findings CTA HEAD FINDINGS Anterior circulation: Faint/thready opacification through the petrous carotid with complete occlusion of the ICA at the cavernous sinus. Non-opacification of the right proximal MCA and  proximal right A1 ACA. More distal right A2 ACA and distal right MCA branches are likely opacified by collateral flow. Left ICA is patent with mild cavernous ICA stenosis due to atherosclerosis. Severe focal narrowing of the left M1 MCA with distal MCA opacification on the left. Left A1 ACA and left A2 ACA are patent. Posterior circulation: Bilateral intradural vertebral arteries, basilar artery and posterior cerebral arteries are patent. Multifocal moderate to severe stenosis of bilateral P2 PCAs. Venous sinuses: As permitted by contrast timing, patent. Review of the MIP images confirms the above findings CT Brain Perfusion Findings: ASPECTS: 9 CBF (<30%) Volume: 49mL Perfusion (Tmax>6.0s) volume: 38mL Mismatch Volume: 46mL  Infarction Location:[place] IMPRESSION: 1. Severe atherosclerosis at the right carotid bifurcation with severe, nearly occlusive narrowing of the proximal ICA. In the more distal ICA, there is only faint/thready opacification through the petrous carotid with complete occlusion of the ICA at the cavernous sinus. Non-opacification of the right M1 MCA and proximal right A1 ACA. More distal right A2 ACA and distal right MCA branches are likely opacified by collateral flow. 2. On perfusion imaging, there is 58 mL of penumbra in the posterior right MCA territory. While there are no recent priors to confirm, perfusion findings suggest that the aforementioned occlusion may be in part acute (possibly acute thrombosis superimposed on existing atherosclerosis/stenosis). No evidence of core infarct. 3. Severe narrowing of the left M1 MCA with distal opacification. 4. Multifocal moderate to severe stenosis of bilateral P2 PCAs. 5. Extensive left carotid bifurcation atherosclerosis without greater than 50% narrowing. 6. Mucous plugging of an enlarged bronchus in the left upper lobe with distal tree-in-bud opacification, suggesting underlying bronchial obstruction (either from fluid/debris or a bronchial lesion) and distal infectious/inflammatory process. Recommend follow-up chest CT in 2-3 months to further evaluate and ensure stability and/or resolution. 7. Heterogeneous thyroid with multiple large nodules, compatible with goiter. Consider follow-up thyroid ultrasound to further characterize. Critical findings discussed with Dr. Cheral Marker at 11:54 a.m. via telephone. Electronically Signed   By: Margaretha Sheffield MD   On: 12/24/2020 12:36    EKG: Independently reviewed.  Sinus tachycardia, QTC 479 ms, lateral nonspecific ST changes     Assessment and Plan:   Principal Problem:   CVA (cerebral vascular accident) ([place]) Active Problems:   MENINGIOMA   Hypothyroidism   Bell's palsy   Essential hypertension   Macular  degeneration   Endometrial cancer ([place])    1.  Acute ischemic right MCA stroke: Patient currently appears weak on the left side but she believes her left upper extremity weakness is her baseline due to chronic left shoulder pain.  Patient with diffuse and concerning atherosclerotic changes in posterior/anterior circulation as discussed above.  Neurology has evaluated patient and recommended aspirin 325 mg daily (received 650 mg in the ED today).  Overall patient and family leaning towards symptom management and do not wish aggressive interventions.  Will defer MRI at this point (would not change management, patient also has a gold chip placed 10 years back in her right eyelid to help with ptosis) as discussed with neurology.  Permissive hypertension with goal around systolic 062-6 94W given high-grade stenosis.  Can discontinue Cleviprex drip and resume home lisinopril.  Will hold home diuretics for now.  PT/OT/ST eval in a.m.  Can eat if passes bedside swallow.  Was residing at ILF-May likely need ALF/SNF upon discharge.  2.  Carotid stenosis: CTA head/neck shows diffuse moderate to severe atherosclerosis in bilateral internal carotids as well as posterior circulation.  No aggressive interventions per neurology and family discussions.  3.  Hypertension: Resume lisinopril and hold diuretics for now as discussed above.  Permissive hypertension given high-grade carotid stenosis and acute CVA.  4.  CKD stage III: Creatinine appears to be stable and at baseline currently.  Avoid nephrotoxic medications.  5.  Endometrial ca: Noted to be on Provera supplementation.  Will hold given problem #1.  6.  Abnormal CT: CT of the neck reported--Mucous plugging of an enlarged bronchus in the left upper lobe with distal tree-in-bud opacification, suggesting underlying bronchial obstruction (either from fluid/debris or a bronchial lesion) and distal infectious/inflammatory process. Recommend follow-up chest CT in 2-3  months to further evaluate and ensure stability and/or resolution. Since patient has leukocytosis-will obtain chest x-ray (prior x-ray in 09/2019 reported basilar atelectasis otherwise normal) and place on empiric antibiotics.  She is on nasal cannula currently but never been hypoxic in the ED.  No complaints of cough or dyspnea.  No aggressive interventions per family.  7.  Hypothyroidism: Resume home medications.  8.  Macular degeneration, cognitive decline, hallucinations: According to family patient has had memory issues for a long time but lately has been declining with intermittent auditory/visual hallucinations.  She is at high risk for delirium given problem #1.  Delirium precautions and as needed medications as needed for symptom management.  She does have partial-complete blindness due to severe macular degeneration at baseline per family.  9.   Goals of care: Patient does not want her medical care to be discussed with her daughter, Lattie Haw.  She blames her daughter for being sick and being brought to the hospital.  She states she is rather dependent on her son-in-law.  Called and discussed with son-in-law-Glenn Andrews-who provided above history and also reiterated her wish for minimal interventions, DNR and "letting nature take its course".  Placed DNR order, consulted palliative care for further discussions and completion of MOST form with family in a.m.  DVT prophylaxis: Lovenox  COVID screen: Pending  Code Status: DNR    Patient/Family Communication: Discussed with patient who confirmed her wish for DNR and also discussed with family/son-in-law in detail as outlined above.  Consults called: Neurology, palliative care Admission status : I certify that at the point of admission it is my clinical judgment that the patient will require inpatient hospital care spanning beyond 2 midnights from the point of admission due to high intensity of service and high frequency of surveillance  required.Inpatient status is judged to be reasonable and necessary in order to provide the required intensity of service to ensure the patient's safety. The patient's presenting symptoms, physical exam findings, and initial radiographic and laboratory data in the context of their chronic comorbidities is felt to place them at high risk for further clinical deterioration. The following factors support the patient status of inpatient : Acute CVA with persistent left-sided weakness, uncontrolled blood pressure and intermittent confusion.     Guilford Shi MD [facility] Pager in [place]  If 7PM-7AM, please contact night-coverage [email or web address]   12/24/2020, 4:06 PM

## 2020-12-24 NOTE — Progress Notes (Addendum)
Pt admitted to 3W37 at this time.  Pt is alert and oriented to self.  Disoriented to place, month, year.  She is repeatedly asking for underwear and for "a smart girl to do what she asks."  Also asking for her purse which did not come with her from ED.  Unsure if patient had purse with her.  She only had a bag with clothing and a necklace. Telemetry placed on patient, CCMD called and second verification completed.  Bed alarm set and call bell within reach.

## 2020-12-24 NOTE — ED Notes (Signed)
Pt refuses vital signs and NIHSS. Pt is non complaint. MD notified.

## 2020-12-24 NOTE — Progress Notes (Signed)
Called back x2 to get report on this patient called [phone], the number given before room 84 was ready/ new room 37 approved, when ED RN attempted first call for report.

## 2020-12-24 NOTE — ED Notes (Signed)
Pt will not leave cardiac monitor cords on. MD notified. Pt is very non compliant. I tried to hook pt up to monitor and pt became slightly aggressive and push my hand away. Pt states "get me out of here. You guys made up that I had a stoke"

## 2020-12-24 NOTE — Code Documentation (Addendum)
Pt is 87 yr old female who arrived at [facility] at 47 via EMS. Code stroke activated at 1108 for left sided weakness.She was last known well yesterday evening at 1830. Pt is alert and oriented, no focal abnormalities at bridge. Cleared by EDP, Labs and CBG (113) drawn. Pt taken to CT at 1126. CTNC obtained. Pt reexamined in CT and had rt gaze preference and some neglect of left side per neurologist. Exam difficult due to pt lack of cooperation. Pt kept asking for her son-in-law, [name]. She keeps stating she was fine until she arrived, and that we were trying to make her an invalid. CT neg for hemorrhage per neurologist. CTA and Perfusion obtained. Pt repeated saying she is fine, and asking for [facility], and wants to get out of here. Pt taken to ED room 9. Dr Cheral Marker met with son-in-law [name] at bedside. He explained that pt is having a stroke, and that she is outside of the window for TPA, but could be a candidate for NIR. Pt's proxy [name] declined NIR procedure at this time. Asprin ordered. Pt passed [facility] swallow screen, and was given 650 mg asprin by mouth at 1216. Pt will need q 2 hr VS and mNIHSS as her stroke work-up continues. Bedside handoff with [place] RN complete.

## 2020-12-24 NOTE — Progress Notes (Signed)
ASA 650 po given per order as pt passed [facility] at 1213.

## 2020-12-24 NOTE — ED Notes (Signed)
RN attempted report x2 

## 2020-12-25 ENCOUNTER — Inpatient Hospital Stay (HOSPITAL_COMMUNITY): Payer: PPO

## 2020-12-25 DIAGNOSIS — H3530 Unspecified macular degeneration: Secondary | ICD-10-CM

## 2020-12-25 DIAGNOSIS — Z7189 Other specified counseling: Secondary | ICD-10-CM

## 2020-12-25 DIAGNOSIS — R451 Restlessness and agitation: Secondary | ICD-10-CM

## 2020-12-25 DIAGNOSIS — R9389 Abnormal findings on diagnostic imaging of other specified body structures: Secondary | ICD-10-CM

## 2020-12-25 DIAGNOSIS — I6389 Other cerebral infarction: Secondary | ICD-10-CM

## 2020-12-25 DIAGNOSIS — D320 Benign neoplasm of cerebral meninges: Secondary | ICD-10-CM

## 2020-12-25 DIAGNOSIS — I63311 Cerebral infarction due to thrombosis of right middle cerebral artery: Secondary | ICD-10-CM

## 2020-12-25 DIAGNOSIS — Z66 Do not resuscitate: Secondary | ICD-10-CM

## 2020-12-25 DIAGNOSIS — Z789 Other specified health status: Secondary | ICD-10-CM

## 2020-12-25 LAB — ECHOCARDIOGRAM COMPLETE
AR max vel: 2.21 cm2
AV Area VTI: 2.43 cm2
AV Area mean vel: 2.32 cm2
AV Mean grad: 5 mmHg
AV Peak grad: 7.4 mmHg
Ao pk vel: 1.36 m/s
Area-P 1/2: 8.16 cm2
MV VTI: 2.64 cm2
S' Lateral: 2.3 cm
Weight: 2229.29 oz

## 2020-12-25 LAB — HEMOGLOBIN A1C
Hgb A1c MFr Bld: 5.6 % (ref 4.8–5.6)
Mean Plasma Glucose: 114.02 mg/dL

## 2020-12-25 LAB — LIPID PANEL
Cholesterol: 224 mg/dL — ABNORMAL HIGH (ref 0–200)
HDL: 50 mg/dL (ref >40)
LDL Cholesterol: 146 mg/dL — ABNORMAL HIGH (ref 0–99)
Total CHOL/HDL Ratio: 4.5 RATIO
Triglycerides: 140 mg/dL (ref <150)
VLDL: 28 mg/dL (ref 0–40)

## 2020-12-25 MED ORDER — ONDANSETRON 4 MG PO TBDP: 4.0000 mg | ORAL_TABLET | Freq: Four times a day (QID) | ORAL | Status: DC | PRN

## 2020-12-25 MED ORDER — HYDROMORPHONE HCL 1 MG/ML PO LIQD
0.2500 mg | ORAL | Status: DC | PRN
  Administered 2020-12-27 – 2020-12-30 (×9): 0.5 mg via ORAL
  Filled 2020-12-25 (×9): qty 1

## 2020-12-25 MED ORDER — PERFLUTREN LIPID MICROSPHERE
1.0000 mL | INTRAVENOUS | Status: DC | PRN
  Administered 2020-12-25: 5 mL via INTRAVENOUS
  Filled 2020-12-25: qty 10

## 2020-12-25 MED ORDER — LORAZEPAM 2 MG/ML IJ SOLN
1.0000 mg | INTRAMUSCULAR | Status: DC | PRN
  Administered 2020-12-29 – 2020-12-30 (×2): 1 mg via INTRAVENOUS
  Filled 2020-12-25 (×2): qty 1

## 2020-12-25 MED ORDER — LORAZEPAM 1 MG PO TABS: 1.0000 mg | ORAL_TABLET | ORAL | Status: DC | PRN

## 2020-12-25 MED ORDER — ASPIRIN EC 325 MG PO TBEC
325.0000 mg | DELAYED_RELEASE_TABLET | Freq: Every day | ORAL | Status: DC
  Administered 2020-12-25 – 2020-12-26 (×2): 325 mg via ORAL
  Filled 2020-12-25 (×2): qty 1

## 2020-12-25 MED ORDER — HALOPERIDOL LACTATE 2 MG/ML PO CONC
2.0000 mg | Freq: Four times a day (QID) | ORAL | Status: DC | PRN
  Filled 2020-12-25: qty 1

## 2020-12-25 MED ORDER — CLOPIDOGREL BISULFATE 75 MG PO TABS
75.0000 mg | ORAL_TABLET | Freq: Every day | ORAL | Status: DC
  Administered 2020-12-25 – 2020-12-26 (×2): 75 mg via ORAL
  Filled 2020-12-25 (×2): qty 1

## 2020-12-25 MED ORDER — GLYCOPYRROLATE 0.2 MG/ML IJ SOLN: 0.2000 mg | INTRAMUSCULAR | Status: DC | PRN

## 2020-12-25 MED ORDER — POLYVINYL ALCOHOL 1.4 % OP SOLN: 1.0000 [drp] | Freq: Four times a day (QID) | OPHTHALMIC | Status: DC | PRN

## 2020-12-25 MED ORDER — ONDANSETRON HCL 4 MG/2ML IJ SOLN
4.0000 mg | Freq: Four times a day (QID) | INTRAMUSCULAR | Status: DC | PRN
Start: 2020-12-25 — End: 2020-12-31

## 2020-12-25 MED ORDER — HALOPERIDOL LACTATE 5 MG/ML IJ SOLN
2.0000 mg | Freq: Four times a day (QID) | INTRAMUSCULAR | Status: DC | PRN
  Administered 2020-12-27 – 2020-12-28 (×3): 2 mg via INTRAVENOUS
  Filled 2020-12-25 (×3): qty 1

## 2020-12-25 MED ORDER — LORAZEPAM 2 MG/ML PO CONC
1.0000 mg | ORAL | Status: DC | PRN
  Administered 2020-12-25: 1 mg via SUBLINGUAL
  Filled 2020-12-25: qty 1

## 2020-12-25 MED ORDER — GLYCOPYRROLATE 1 MG PO TABS
1.0000 mg | ORAL_TABLET | ORAL | Status: DC | PRN
  Filled 2020-12-25: qty 1

## 2020-12-25 MED ORDER — ACETAMINOPHEN 650 MG RE SUPP: 650.0000 mg | Freq: Four times a day (QID) | RECTAL | Status: DC | PRN

## 2020-12-25 MED ORDER — ATORVASTATIN CALCIUM 40 MG PO TABS
40.0000 mg | ORAL_TABLET | Freq: Every day | ORAL | Status: DC
  Administered 2020-12-25: 40 mg via ORAL
  Filled 2020-12-25: qty 1

## 2020-12-25 MED ORDER — BIOTENE DRY MOUTH MT LIQD
15.0000 mL | Freq: Two times a day (BID) | OROMUCOSAL | Status: DC
  Administered 2020-12-25 – 2020-12-30 (×7): 15 mL via TOPICAL

## 2020-12-25 MED ORDER — OLANZAPINE 5 MG PO TBDP
5.0000 mg | ORAL_TABLET | Freq: Every day | ORAL | Status: DC
  Administered 2020-12-25 – 2020-12-27 (×3): 5 mg via ORAL
  Filled 2020-12-25 (×4): qty 1

## 2020-12-25 MED ORDER — ACETAMINOPHEN 325 MG PO TABS
650.0000 mg | ORAL_TABLET | Freq: Four times a day (QID) | ORAL | Status: DC | PRN
  Administered 2020-12-25: 650 mg via ORAL
  Filled 2020-12-25: qty 2

## 2020-12-25 MED ORDER — HALOPERIDOL 0.5 MG PO TABS: 2.0000 mg | ORAL_TABLET | Freq: Four times a day (QID) | ORAL | Status: DC | PRN

## 2020-12-25 NOTE — Progress Notes (Addendum)
STROKE TEAM PROGRESS NOTE   INTERVAL HISTORY Resting in bed. Afebrile. VSS.  Vitals:   12/25/20 0414 12/25/20 0700 12/25/20 0728 12/25/20 1146  BP: (!) 172/51 (!) 196/72 (!) 168/62 (!) 186/64  Pulse: 88 92  91  Resp: _0 Temp: (!) 97.5 F (36.4 C) 97.7 F (36.5 C)  98.5 F (36.9 C)  TempSrc: Oral Axillary  Axillary  SpO2: 100% 100%  99%  Weight:       CBC:  Recent Labs  Lab 12/24/20 1128  WBC 8.6  NEUTROABS 6.8  HGB 12.1  11.2*  HCT 36.8  33.0*  MCV 93.9  PLT 578   Basic Metabolic Panel:  Recent Labs  Lab 12/24/20 1128  NA 142  143  K 4.3  4.4  CL 110  110  CO2 25  GLUCOSE 122*  117*  BUN 22  30*  CREATININE 1.66*  1.80*  CALCIUM 9.4   Lipid Panel:  Recent Labs  Lab 12/25/20 0411  CHOL 224*  TRIG 140  HDL 50  CHOLHDL 4.5  VLDL 28  LDLCALC 146*   HgbA1c:  Recent Labs  Lab 12/25/20 0411  HGBA1C 5.6   Urine Drug Screen: No results for input(s): LABOPIA, COCAINSCRNUR, LABBENZ, AMPHETMU, THCU, LABBARB in the last 168 hours.  Alcohol Level No results for input(s): ETH in the last 168 hours.  IMAGING past 24 hours DG CHEST PORT 1 VIEW  Result Date: 12/24/2020 CLINICAL DATA:  CVA, abnormal appearance of the upper chest on CT of the neck EXAM: PORTABLE CHEST 1 VIEW COMPARISON:  12/24/2020, 10/10/2019 FINDINGS: Single frontal view of the chest demonstrates an unremarkable cardiac silhouette. No acute airspace disease, effusion, or pneumothorax. The mucous plugging within the left upper lobe seen on CT is not apparent on this portable radiograph. No acute bony abnormalities. IMPRESSION: 1. No acute intrathoracic process. 2. The left upper lobe mucous plugging seen on CT is not appreciated by radiograph. Previous recommendation for follow-up chest CT in 2-3 months is again recommended. Electronically Signed   By: Randa Ngo M.D.   On: 12/24/2020 17:25   ECHOCARDIOGRAM COMPLETE  Result Date: 12/25/2020    ECHOCARDIOGRAM REPORT   Patient Name:    Jessica Haynes Date of Exam: 12/25/2020 Medical Rec #:  [id number]         Height:       63.0 in Accession #:    [id number]        Weight:       139.3 lb Date of Birth:  06/06/1923         BSA:          1.658 m Patient Age:    87 years          BP:           172/51 mmHg Patient Gender: F                 HR:           88 bpm. Exam Location:  Inpatient Procedure: 2D Echo, Cardiac Doppler and Color Doppler Indications:    Stroke  History:        Patient has no prior history of Echocardiogram examinations.                 Risk Factors:Hypertension.  Sonographer:    Cammy Brochure Referring Phys: [id number] Guilford Shi  Sonographer Comments: Image acquisition challenging due to uncooperative patient. IMPRESSIONS  1. Left ventricular ejection  fraction, by estimation, is 60 to 65%. The left ventricle has normal function. The left ventricle has no regional wall motion abnormalities. There is mild left ventricular hypertrophy of the basal and septal segments. Left ventricular diastolic parameters are consistent with Grade I diastolic dysfunction (impaired relaxation).  2. Right ventricular systolic function is normal. The right ventricular size is normal.  3. The pericardial effusion is RA posterior.  4. Small diastolic gradient across MV from MAC and leaflet calcium no MS . The mitral valve is degenerative. No evidence of mitral valve regurgitation. No evidence of mitral stenosis. Moderate mitral annular calcification.  5. The aortic valve is tricuspid. There is moderate calcification of the aortic valve. Aortic valve regurgitation is not visualized. Mild to moderate aortic valve sclerosis/calcification is present, without any evidence of aortic stenosis.  6. The inferior vena cava is normal in size with greater than 50% respiratory variability, suggesting right atrial pressure of 3 mmHg. FINDINGS  Left Ventricle: Left ventricular ejection fraction, by estimation, is 60 to 65%. The left ventricle has normal function.  The left ventricle has no regional wall motion abnormalities. The left ventricular internal cavity size was normal in size. There is  mild left ventricular hypertrophy of the basal and septal segments. Left ventricular diastolic parameters are consistent with Grade I diastolic dysfunction (impaired relaxation). Right Ventricle: The right ventricular size is normal. No increase in right ventricular wall thickness. Right ventricular systolic function is normal. Left Atrium: Left atrial size was normal in size. Right Atrium: Right atrial size was normal in size. Pericardium: Trivial pericardial effusion is present. The pericardial effusion is RA posterior. Mitral Valve: Small diastolic gradient across MV from MAC and leaflet calcium no MS. The mitral valve is degenerative in appearance. There is moderate thickening of the mitral valve leaflet(s). There is moderate calcification of the mitral valve leaflet(s). Moderate mitral annular calcification. No evidence of mitral valve regurgitation. No evidence of mitral valve stenosis. MV peak gradient, 14.3 mmHg. The mean mitral valve gradient is 6.0 mmHg. Tricuspid Valve: The tricuspid valve is normal in structure. Tricuspid valve regurgitation is mild . No evidence of tricuspid stenosis. Aortic Valve: The aortic valve is tricuspid. There is moderate calcification of the aortic valve. Aortic valve regurgitation is not visualized. Mild to moderate aortic valve sclerosis/calcification is present, without any evidence of aortic stenosis. Aortic valve mean gradient measures 5.0 mmHg. Aortic valve peak gradient measures 7.4 mmHg. Aortic valve area, by VTI measures 2.43 cm. Pulmonic Valve: The pulmonic valve was normal in structure. Pulmonic valve regurgitation is trivial. No evidence of pulmonic stenosis. Aorta: The aortic root is normal in size and structure. Venous: The inferior vena cava is normal in size with greater than 50% respiratory variability, suggesting right atrial  pressure of 3 mmHg. IAS/Shunts: No atrial level shunt detected by color flow Doppler.  LEFT VENTRICLE PLAX 2D LVIDd:         3.10 cm  Diastology LVIDs:         2.30 cm  LV e' medial:   6.74 cm/s LV PW:         1.10 cm  LV E/e' medial: 20.4 LV IVS:        1.20 cm LVOT diam:     1.80 cm LV SV:         74 LV SV Index:   44 LVOT Area:     2.54 cm  RIGHT VENTRICLE             IVC  RV S prime:     11.70 cm/s  IVC diam: 1.50 cm LEFT ATRIUM             Index       RIGHT ATRIUM          Index LA diam:        3.20 cm 1.93 cm/m  RA Area:     7.45 cm LA Vol (A2C):   35.9 ml 21.65 ml/m RA Volume:   12.00 ml 7.24 ml/m LA Vol (A4C):   35.0 ml 21.11 ml/m LA Biplane Vol: 35.8 ml 21.59 ml/m  AORTIC VALVE AV Area (Vmax):    2.21 cm AV Area (Vmean):   2.32 cm AV Area (VTI):     2.43 cm AV Vmax:           136.00 cm/s AV Vmean:          105.000 cm/s AV VTI:            0.303 m AV Peak Grad:      7.4 mmHg AV Mean Grad:      5.0 mmHg LVOT Vmax:         118.00 cm/s LVOT Vmean:        95.700 cm/s LVOT VTI:          0.289 m LVOT/AV VTI ratio: 0.95  AORTA Ao Root diam: 3.00 cm MITRAL VALVE                TRICUSPID VALVE MV Area (PHT): 8.16 cm     TR Peak grad:   28.1 mmHg MV Area VTI:   2.64 cm     TR Vmax:        265.00 cm/s MV Peak grad:  14.3 mmHg MV Mean grad:  6.0 mmHg     SHUNTS MV Vmax:       1.89 m/s     Systemic VTI:  0.29 m MV Vmean:      110.0 cm/s   Systemic Diam: 1.80 cm MV Decel Time: 93 msec MV E velocity: 137.50 cm/s MV A velocity: 175.00 cm/s MV E/A ratio:  0.79 Jenkins Rouge MD Electronically signed by Jenkins Rouge MD Signature Date/Time: 12/25/2020/10:01:21 AM    Final     PHYSICAL EXAM Neurological Examination Mental Status: Awake and alert. Speech is not fluent. Able to follow only some commands. Left hemineglect. Does not cooperate when asked orientation questions.   Cranial Nerves: II: Does not cooperate with visual field testing. Keeps left eye completely to partially closed. Right pupil reactive and  round, larger than left pupil;  III,IV, VI: Right eyelid closure is weak (chronic right Bells palsy). Keeps left eye completely to partially closed. Will briefly track to the left and right V,VII: Weakness of right upper and lower quadrants of face (chronic right Bell's palsy). Reacts to touch bilaterally.  VIII: Hearing intact to voice IX,X: No hypophonia XI: Head is rotated preferentially to the right.  XII: Does not protrude tongue to command.  Motor: RUE and RLE 4-5/5 without drift.  LUE and LLE with decreased spontaneous and volitional movement relative to the right, but could hold both antigravity briefly.  Sensory: Decreased reactivity to left sided sensory stimuli.  Cerebellar: Does not follow commands for testing.   ASSESSMENT/PLAN Jessica Haynes is a 87 y.o. female with history of deafness in right ear, near complete blindness due to macular degeneration, hypertension, hyperlipidemia, hypothyroidism, right facial droop due to Bell's palsy, and endometrial cancer in 2014  presented to [facility] ED on 12/24/2020 from [facility] after a fall and family concerns of left facial droop, slurred speech, left-sided weakness.   Stroke:  Acute ischemic right MCA stroke likely due to atherosclerosis  Code StrokeCT head hypodensity in the right parietal white mater. ASPECTS 9.    CTA head & neck: atherosclerosis at the right carotid bifurcation with severe, nearly occlusive narrowing of the proximal ICA  CT perfusion 58 mL of penumbra in the posterior right MCA territory  2D Echo EF 60-65%, mild LVH, grade 1 diastolic dysfunction  LDL 146  HgbA1c 5.6  VTE prophylaxis - SCDs  Diet: Regular  aspirin 81 mg daily prior to admission, now on aspirin 81 mg daily and clopidogrel 75 mg daily. Aspirin and plavix for 3 weeks then aspirin alone  Palliative care consult: transition to full comfort measures  Therapy recommendations:  PT/OT 24 hour physical assistance  Disposition: independent  living at [place] with private duty caregivers and hospice  Hypertension  Home meds:  Lisinopril 24m daily, HCTZ 23mdaily  Stable . Long-term BP goal 140-160  Hyperlipidemia  Home meds:  none  LDL 146, goal < 70  Add atorvastatin  Continue statin at discharge  Diabetes type II Controlled  Home meds:  none  HgbA1c 5.6, goal < 7.0  CBGs Recent Labs    12/24/20 1124  GLUCAP 113*      SSI  Other Stroke Risk Factors  Advanced Age >/= 6561 Family hx stroke (mother)  Other Active Problems  Depression: zoloft  Hypothyroidism: synthroid 10042mdaily  Meningioma  Skin cancer  Hospital day # 1  Lissy Olivencia-Simmons, ACNP-BC Stroke NP  ATTENDING NOTE: I reviewed above note and agree with the assessment and plan. Pt was seen and examined.   [age]ar old female with history of hypertension, hyperlipidemia, right Bell's palsy, right middle cerebral fossa meningioma admitted for left-sided weakness, left facial droop.  CT showed right MCA infarct, old right CR/BG infarct.  CTA head and neck showed right ICA string sign right cavernous ICA complete occlusion, left M1 proximal stenosis, left ICA bulb atherosclerosis.  EF 60 to 65%.  A1c 5.6, LDL 146.  Creatinine 1.66.  Palliative care on board, has discussed with family they requested no heroic measures for further intervention or investigation.  And family quite clear that patient would not want life prolonged if patient no longer has quality life or functional condition.  For this reason, family requested comfort care measures.  Currently, patient is transition to full.  Neurology will sign off.  Please call with further questions.  For detailed assessment and plan, please refer to above as I have made changes wherever appropriate.   JinRosalin HawkingD PhD Stroke Neurology 12/25/2020 11:00 PM     To contact Stroke Continuity provider, please refer to [email or web address] hours, contact General Neurology

## 2020-12-25 NOTE — Progress Notes (Signed)
Resting in bed calmly taking aloud to herself with her eyes closed.  Did note a skin tear on her left arm, possibly from scratching self, as noted some blood on her nails of her right hand.  Area cleansed and Palliative NP and son-in-law notified.  Dressing applied.  Has been lying cross-ways in the bed today but has, for the most part, been calm.  No combative behavior.  Has been cooperative and occasionally will ask this nurse to go to the kitchen to cook something or go to her bedroom and get something and has to be reminded that she is in the hospital.  No signs of distress.  Palliative nurse did say to give her the medication to help calm her and relax her so she can sleep tonight, as she did not sleep at all last noc.

## 2020-12-25 NOTE — Evaluation (Addendum)
Occupational Therapy Evaluation Patient Details Name: Jessica Haynes MRN: [id number] DOB: 21-Jun-1923 Today's Date: 12/25/2020    History of Present Illness 87 yo female presents to ED on 5/3 with new onset L-sided weakness. CTA of head/neck showed Severe atherosclerosis at the right carotid bifurcation with severe, nearly occlusive narrowing of the proximal ICA; possible acute thrombosis superimposed on existing atherosclerosis/stenosis of R MCA; severe narrowing of L M1 MCA, and severe stenosis of bilateral P2 PCAs. Additionally, CT neck shows mucous plugging of enlarged bronchus in LUL, bronchial obstruction, and distal inflammatory process. PMH includes R ear deafness, endometrial cancer, HLD, HTN, hypothyroidism, right middle cranial fossa meningioma, shingles, R bell's palsy and skin cancer.   Clinical Impression   Pt admitted with the above and demonstrates the below listed deficits. She currently requires max A - total A +2 for all aspects of ADLs and functional mobility.  Evaluation limited by severity of cognitive deficits and Lt neglect.  PTA, she was residing at [facility].   Per palliative care, pt's family has opted to transition pt to comfort care.  Due to this will discharge pt at this time from OT services.      Follow Up Recommendations  No OT follow up;Supervision/Assistance - 24 hour - will require 24 hour physical assistance    Equipment Recommendations  Hospital bed; BSC; w/c, hoyer lift    Recommendations for Other Services       Precautions / Restrictions Precautions Precautions: Fall Precaution Comments: L inattention, L hemiparesis, periods of grabbing therapists Restrictions Weight Bearing Restrictions: No      Mobility Bed Mobility Overal bed mobility: Needs Assistance Bed Mobility: Supine to Sit;Sit to Supine     Supine to sit: Max assist;+2 for physical assistance Sit to supine: Total assist;+2 for physical assistance   General bed mobility  comments: Max-total +2 for trunk elevation/lowering, completion of LEs to EOB, and scooting to/from EOB with bed pads. Boost up in bed upon return to supine with bed pads. Increased assist level required secondary to pt resistance at times.    Transfers Overall transfer level: Needs assistance Equipment used: 2 person hand held assist Transfers: Sit to/from Stand Sit to Stand: Max assist;+2 physical assistance         General transfer comment: max +2 for power up, rise, hip extension, L knee blocking, and steadying upon standing. Standing tolerance <10 seconds, standing trials x2 with x1 successful full stand. Pt unable to take steps at this time when attempted.    Balance Overall balance assessment: Needs assistance Sitting-balance support: Single extremity supported;Feet supported Sitting balance-Leahy Scale: Poor Sitting balance - Comments: heavy multidirectional leaning, most prevalently to L and anteriorly. Pt with little to no ability to correct balance, requires at least min truncal assist Postural control: Left lateral lean Standing balance support: Bilateral upper extremity supported;During functional activity Standing balance-Leahy Scale: Zero Standing balance comment: max +2                           ADL either performed or assessed with clinical judgement   ADL Overall ADL's : Needs assistance/impaired Eating/Feeding: Maximal assistance;Bed level   Grooming: Maximal assistance;Sitting   Upper Body Bathing: Maximal assistance;Sitting   Lower Body Bathing: Maximal assistance;Sit to/from stand   Upper Body Dressing : Total assistance;Sitting   Lower Body Dressing: Total assistance;Sit to/from stand   Toilet Transfer: Maximal assistance;+2 for physical assistance Toilet Transfer Details (indicate cue type and reason):  scooting up EOB Toileting- Clothing Manipulation and Hygiene: Total assistance;Sit to/from stand       Functional mobility during ADLs:  Maximal assistance;+2 for physical assistance;+2 for safety/equipment General ADL Comments: pt limited by severity of cognitive deficits and Lt neglect     Vision   Additional Comments: pt unable to participate in formal visual assessment.  She does not attend to items in her Lt visual field     Perception Perception Perception Tested?: Yes Perception Deficits: Inattention/neglect Inattention/Neglect: Does not attend to left visual field;Does not attend to left side of body Spatial deficits: LT neglect noted   Praxis Praxis Praxis tested?: Deficits Deficits: Initiation    Pertinent Vitals/Pain Pain Assessment: No/denies pain     Hand Dominance Right   Extremity/Trunk Assessment Upper Extremity Assessment Upper Extremity Assessment: LUE deficits/detail LUE Deficits / Details: PROM WFL.  Pt does neglects lt UE with only minimal movement noted LUE Sensation: decreased proprioception LUE Coordination: decreased fine motor;decreased gross motor   Lower Extremity Assessment Lower Extremity Assessment: Defer to PT evaluation LLE Deficits / Details: Able to perform partial ROM knee extension sitting EOB, unable to fully assess secondary to cog and inattention   Cervical / Trunk Assessment Cervical / Trunk Assessment: Kyphotic   Communication Communication Communication: Expressive difficulties (dysarthria)   Cognition Arousal/Alertness: Awake/alert Behavior During Therapy: Restless;Anxious Overall Cognitive Status: Impaired/Different from baseline Area of Impairment: Orientation;Attention;Following commands;Safety/judgement;Problem solving                 Orientation Level: Disoriented to;Place;Situation Current Attention Level: Focused Memory: Decreased short-term memory Following Commands: Follows one step commands inconsistently Safety/Judgement: Decreased awareness of safety;Decreased awareness of deficits Awareness: Emergent Problem Solving: Decreased  initiation;Difficulty sequencing;Requires verbal cues;Requires tactile cues General Comments: Pt not oriented to location, states "I should be at home, I am at home", therapists oriented pt to hospital. Pt with very brief periods of attention, 3-5 seconds with max cuing. Pt states over and over "I need my cane, it's over near the bathroom by the living room", but cannot stand without total +2. L inattention, orients to L with MAX multimodal cuing and only briefly.   General Comments       Exercises     Shoulder Instructions      Home Living Family/patient expects to be discharged to:: Other (Comment) (ILF) Living Arrangements: Alone Available Help at Discharge: Family;Available PRN/intermittently Type of Home: Independent living facility Home Access: Level entry;Elevator     Home Layout: One level               Home Equipment: Cane - single point   Additional Comments: Pt unable to provide info, but per chart, she was in independent living at [facility]      Prior Functioning/Environment Level of Independence: Independent with assistive device(s)        Comments: pt indicates she uses a SPC or rollator        OT Problem List: Decreased strength;Decreased range of motion;Decreased activity tolerance;Impaired balance (sitting and/or standing);Impaired vision/perception;Decreased coordination;Decreased cognition;Decreased safety awareness;Decreased knowledge of use of DME or AE;Impaired UE functional use      OT Treatment/Interventions:      OT Goals(Current goals can be found in the care plan section) Acute Rehab OT Goals Patient Stated Goal: I need my cane OT Goal Formulation: All assessment and education complete, DC therapy  OT Frequency:     Barriers to D/C:            Co-evaluation  PT/OT/SLP Co-Evaluation/Treatment: Yes Reason for Co-Treatment: For patient/therapist safety;To address functional/ADL transfers;Necessary to address cognition/behavior  during functional activity PT goals addressed during session: Mobility/safety with mobility;Balance OT goals addressed during session: ADL's and self-care      AM-PAC OT "6 Clicks" Daily Activity     Outcome Measure Help from another person eating meals?: A Lot Help from another person taking care of personal grooming?: A Lot Help from another person toileting, which includes using toliet, bedpan, or urinal?: Total Help from another person bathing (including washing, rinsing, drying)?: A Lot Help from another person to put on and taking off regular upper body clothing?: Total Help from another person to put on and taking off regular lower body clothing?: Total 6 Click Score: 9   End of Session Nurse Communication: Mobility status  Activity Tolerance: Other (comment) (severity of cognitive deficits) Patient left: in bed;with call bell/phone within reach;with bed alarm set  OT Visit Diagnosis: Cognitive communication deficit (R41.841) Symptoms and signs involving cognitive functions: Cerebral infarction                Time: 3818-2993 OT Time Calculation (min): 21 min Charges:  OT General Charges $OT Visit: 1 Visit OT Evaluation $OT Eval Moderate Complexity: 1 Mod  Nilsa Nutting., OTR/L Acute Rehabilitation Services Pager [phone] Office [phone]   Lucille Passy M 12/25/2020, 4:28 PM

## 2020-12-25 NOTE — Progress Notes (Signed)
CM has called [facility] twice to try and speak with Melissa who will be the contact for [place] services in [place]. Messages left. TOC following.

## 2020-12-25 NOTE — Evaluation (Signed)
Physical Therapy Evaluation Patient Details Name: Jessica Haynes MRN: [id number] DOB: 04-11-1923 Today's Date: 12/25/2020   History of Present Illness  87 yo female presents to ED on 5/3 with new onset L-sided weakness. CTA of head/neck showed Severe atherosclerosis at the right carotid bifurcation with severe, nearly occlusive narrowing of the proximal ICA; possible acute thrombosis superimposed on existing atherosclerosis/stenosis of R MCA; severe narrowing of L M1 MCA, and severe stenosis of bilateral P2 PCAs. Additionally, CT neck shows mucous plugging of enlarged bronchus in LUL, bronchial obstruction, and distal inflammatory process. PMH includes R ear deafness, endometrial cancer, HLD, HTN, hypothyroidism, right middle cranial fossa meningioma, shingles, R bell's palsy and skin cancer.  Clinical Impression   Pt presents with generalized weakness with new focal LUE/LE weakness, L inattention, impaired cognition although unsure of pt baseline, poor to zero sitting and standing balance, inability to ambulate, and decreased activity tolerance vs baseline. Pt to benefit from acute PT to address deficits. Pt requiring max-total +2 assist for bed mobility and transfer to standing today, even so pt calling out "get me my cane, I am going to walk to the bathroom". Pt unable to take steps today even with max PT and OT efforts/cuing/assist.  PT to progress mobility as tolerated, and will continue to follow acutely.      Follow Up Recommendations Other (comment);Supervision/Assistance - 24 hour (home with 24/7 support, family deciding between hospice and increased care services at her facility)    [facility] bed;3in1 (PT)    Recommendations for Other Services       Precautions / Restrictions Precautions Precautions: Fall Precaution Comments: L inattention, L hemiparesis, periods of grabbing therapists Restrictions Weight Bearing Restrictions: No      Mobility  Bed  Mobility Overal bed mobility: Needs Assistance Bed Mobility: Supine to Sit;Sit to Supine     Supine to sit: Max assist;+2 for physical assistance Sit to supine: Total assist;+2 for physical assistance   General bed mobility comments: Max-total +2 for trunk elevation/lowering, completion of LEs to EOB, and scooting to/from EOB with bed pads. Boost up in bed upon return to supine with bed pads. Increased assist level required secondary to pt resistance at times.    Transfers Overall transfer level: Needs assistance Equipment used: 2 person hand held assist Transfers: Sit to/from Stand Sit to Stand: Max assist;+2 physical assistance         General transfer comment: max +2 for power up, rise, hip extension, L knee blocking, and steadying upon standing. Standing tolerance <10 seconds, standing trials x2 with x1 successful full stand. Pt unable to take steps at this time when attempted.  Ambulation/Gait             General Gait Details: unable  Stairs            Wheelchair Mobility    Modified Rankin (Stroke Patients Only) Modified Rankin (Stroke Patients Only) Pre-Morbid Rankin Score: Moderate disability Modified Rankin: Severe disability     Balance Overall balance assessment: Needs assistance Sitting-balance support: Single extremity supported;Feet supported Sitting balance-Leahy Scale: Poor Sitting balance - Comments: heavy multidirectional leaning, most prevalently to L and anteriorly. Pt with little to no ability to correct balance, requires at least min truncal assist Postural control: Left lateral lean Standing balance support: Bilateral upper extremity supported;During functional activity Standing balance-Leahy Scale: Zero Standing balance comment: max +2  Pertinent Vitals/Pain Pain Assessment: No/denies pain    Home Living Family/patient expects to be discharged to:: Private residence Living Arrangements:  Alone Available Help at Discharge: Family;Available PRN/intermittently Type of Home: Independent living facility ([facility] - either ILF or ALF, pt unable to state which) Home Access: Level entry     Home Layout: One level Home Equipment: Cane - single point      Prior Function           Comments: Pt states she uses a cane, unsure of other prior level information as pt unable to state when asked.     Hand Dominance   Dominant Hand: Right    Extremity/Trunk Assessment   Upper Extremity Assessment Upper Extremity Assessment: Defer to OT evaluation    Lower Extremity Assessment Lower Extremity Assessment: Generalized weakness;LLE deficits/detail;Difficult to assess due to impaired cognition LLE Deficits / Details: Able to perform partial ROM knee extension sitting EOB, unable to fully assess secondary to cog and inattention    Cervical / Trunk Assessment Cervical / Trunk Assessment: Kyphotic  Communication   Communication: Other (comment) (L facial droop, mild dysarthric-type speech)  Cognition Arousal/Alertness: Awake/alert Behavior During Therapy: Restless;Anxious Overall Cognitive Status: No family/caregiver present to determine baseline cognitive functioning Area of Impairment: Orientation;Attention;Memory;Following commands;Safety/judgement;Problem solving;Awareness                 Orientation Level: Disoriented to;Place Current Attention Level: Focused Memory: Decreased short-term memory Following Commands: Follows one step commands inconsistently Safety/Judgement: Decreased awareness of deficits;Decreased awareness of safety Awareness: Emergent Problem Solving: Decreased initiation;Difficulty sequencing;Requires verbal cues;Requires tactile cues General Comments: Pt not oriented to location, states "I should be at home, I am at home", therapists oriented pt to hospital. Pt with very brief periods of attention, 3-5 seconds with max cuing. Pt states  over and over "I need my cane, it's over near the bathroom by the living room", but cannot stand without total +2. L inattention, orients to L with MAX multimodal cuing and only briefly.      General Comments      Exercises     Assessment/Plan    PT Assessment Patient needs continued PT services  PT Problem List Decreased strength;Decreased mobility;Decreased safety awareness;Decreased activity tolerance;Decreased balance;Decreased knowledge of use of DME;Decreased cognition;Decreased coordination       PT Treatment Interventions DME instruction;Therapeutic activities;Therapeutic exercise;Patient/family education;Balance training;Functional mobility training;Neuromuscular re-education;Gait training    PT Goals (Current goals can be found in the Care Plan section)  Acute Rehab PT Goals Patient Stated Goal: get my cane and walk PT Goal Formulation: With patient Time For Goal Achievement: 01/08/21 Potential to Achieve Goals: Fair    Frequency Min 3X/week   Barriers to discharge        Co-evaluation PT/OT/SLP Co-Evaluation/Treatment: Yes Reason for Co-Treatment: For patient/therapist safety;To address functional/ADL transfers;Necessary to address cognition/behavior during functional activity PT goals addressed during session: Mobility/safety with mobility;Balance         AM-PAC PT "6 Clicks" Mobility  Outcome Measure Help needed turning from your back to your side while in a flat bed without using bedrails?: Total Help needed moving from lying on your back to sitting on the side of a flat bed without using bedrails?: Total Help needed moving to and from a bed to a chair (including a wheelchair)?: Total Help needed standing up from a chair using your arms (e.g., wheelchair or bedside chair)?: Total Help needed to walk in hospital room?: Total Help needed climbing 3-5 steps with a  railing? : Total 6 Click Score: 6    End of Session   Activity Tolerance: Patient limited by  fatigue Patient left: in bed;with call bell/phone within reach;with bed alarm set Nurse Communication: Mobility status PT Visit Diagnosis: Other abnormalities of gait and mobility (R26.89);Difficulty in walking, not elsewhere classified (R26.2);Hemiplegia and hemiparesis Hemiplegia - Right/Left: Left Hemiplegia - dominant/non-dominant: Non-dominant Hemiplegia - caused by: Cerebral infarction    Time: 4540-9811 PT Time Calculation (min) (ACUTE ONLY): 21 min   Charges:   PT Evaluation $PT Eval Low Complexity: 1 Low          Kanetra Ho S, PT DPT Acute Rehabilitation Services Pager [phone]  Office [phone]  [place] E Ruffin Pyo 12/25/2020, 1:05 PM

## 2020-12-25 NOTE — Plan of Care (Signed)
Patient remains confused as previous baseline. Pulled off [place] leads several times. [place] appiled for safety. Safety precautions maintained.

## 2020-12-25 NOTE — Plan of Care (Signed)
  Problem: Pain Managment: Goal: General experience of comfort will improve Outcome: Progressing   Problem: Safety: Goal: Ability to remain free from injury will improve Outcome: Progressing   Problem: Skin Integrity: Goal: Risk for impaired skin integrity will decrease Outcome: Progressing   

## 2020-12-25 NOTE — Progress Notes (Signed)
PROGRESS NOTE   Jessica Haynes  KGM:[id number]    DOB: [birth year]    DOA: 12/24/2020  PCP: Maurice Small, MD   I have briefly reviewed patients previous medical records in [facility].  Chief Complaint  Patient presents with  . Code Stroke    Brief Narrative:  87 year old female with medical history significant for but not limited to hypertension, hyperlipidemia, hypothyroidism, near complete blindness due to macular degeneration, deafness in right ear, right facial droop due to Bell's palsy (has?  Gold seed in eyelid to help close the eyelid), endometrial CA, chronic bladder incontinence and cognitive dysfunction at baseline, resident of independent living facility, presented to the ED after family noted left-sided weakness.  Neurology was consulted.  Neurology and subsequently palliative care team discussed at length with patient's son-in-law who is her healthcare POA who transitioned her to comfort oriented care.   Assessment & Plan:  Principal Problem:   CVA (cerebral vascular accident) [facility]) Active Problems:   MENINGIOMA   Hypothyroidism   Bell's palsy   Essential hypertension   Macular degeneration   Endometrial cancer ([place])   Acute ischemic stroke Has history of right-sided Bell's palsy.  Now new onset of left-sided weakness and left-sided neglect.  CT head showed new hypodensity in the high right parietal white matter suggestive of indeterminate but interval white matter infarct.  Also noted on CT is a large partially calcified mass along the right middle cranial fossa with extra axial mass infiltrating the skull base and infratemporal soft tissues better characterized on remote MRI from 2011.  Suspected surrounding edema in the temporal lobe.  CTA of the head and neck showed severe atherosclerosis of the right carotid bifurcation with severe, nearly occlusive narrowing of the proximal ICA.  In the more distal ICA, there is only faint/thready opacification through the  petrous carotid with complete occlusion of the ICA at the cavernous sinus.  CT perfusion study: 58 mL of penumbra in the posterior right MCA territory.  Perfusion findings suggest that the a forementioned occlusion may be in part acute (possibly acute thrombosis superimposed on existing atherosclerosis/stenosis) but no evidence of core infarct.  Neurology was consulted and discussed in length with patient's son-in-law, POA risks and benefits of mechanical thrombectomy.  He did not want to have invasive measures to manage her medical or surgical condition.  This morning I discussed with him as well and he reiterated nonaggressive evaluation and management, including no MRI especially given metallic object on the right eyelid, wished comfort oriented care.  Palliative care team has since consulted and patient was transitioned to full comfort measures in house.  Discharge disposition being determined by the [facility] team.  POA wishes for patient to return to [facility] with additional support but may not be an option versus home with hospice.  Following are noted with plan for comfort measures only Carotid stenosis: Hypertension: Stage III B CKD Endometrial CA Abnormal CT of the neck: Mucous plugging of the enlarged bronchus in the left upper lobe with distal tree-in-bud opacification, suggesting underlying bronchial obstruction. Hypothyroidism Macular degeneration, cognitive decline, hallucinations Adult failure to thrive  Body mass index is 24.68 kg/m.    DVT prophylaxis:   None due to comfort measures only   Code Status: DNR Family Communication: I discussed in detail with patient's son-in-law, updated care and answered questions Disposition:  Status is: Inpatient  Remains inpatient appropriate because:Inpatient level of care appropriate due to severity of illness   Dispo: The patient is from:  ALF              Anticipated d/c is to: TBD              Patient currently is medically stable  to d/c.   Difficult to place patient No        Consultants:   Neurology Palliative care medicine  Procedures:   None  Antimicrobials:    Anti-infectives (From admission, onward)   Start     Dose/Rate Route Frequency Ordered Stop   12/24/20 2200  doxycycline (VIBRA-TABS) tablet 100 mg        100 mg Oral Every 12 hours 12/24/20 1613          Subjective:  Hard of hearing.  Difficult historian.  Oriented only to self.  Follows some simple instructions  Objective:   Vitals:   12/25/20 0414 12/25/20 0700 12/25/20 0728 12/25/20 1146  BP: (!) 172/51 (!) 196/72 (!) 168/62 (!) 186/64  Pulse: 88 92  91  Resp: _0 Temp: (!) 97.5 F (36.4 C) 97.7 F (36.5 C)  98.5 F (36.9 C)  TempSrc: Oral Axillary  Axillary  SpO2: 100% 100%  99%  Weight:        General exam: Elderly female, moderately built, frail and chronically ill looking lying comfortably supine in bed Respiratory system: Clear to auscultation. Respiratory effort normal. Cardiovascular system: S1 & S2 heard, RRR. No JVD, murmurs, rubs, gallops or clicks. No pedal edema.  Telemetry personally reviewed: Sinus rhythm. Gastrointestinal system: Abdomen is nondistended, soft and nontender. No organomegaly or masses felt. Normal bowel sounds heard. Central nervous system: Alert and oriented only to self. No focal neurological deficits. Extremities: Left upper extremity grade 3 x 5 power, left lower extremity grade 1 x 5 power.  Right limbs grade 4 x 5 power at least. Skin: No rashes, lesions or ulcers Psychiatry: Judgement and insight impaired. Mood & affect appropriate.     Data Reviewed:   I have personally reviewed following labs and imaging studies   CBC: Recent Labs  Lab 12/24/20 1128  WBC 8.6  NEUTROABS 6.8  HGB 12.1  11.2*  HCT 36.8  33.0*  MCV 93.9  PLT 620    Basic Metabolic Panel: Recent Labs  Lab 12/24/20 1128  NA 142  143  K 4.3  4.4  CL 110  110  CO2 25  GLUCOSE 122*  117*   BUN 22  30*  CREATININE 1.66*  1.80*  CALCIUM 9.4    Liver Function Tests: Recent Labs  Lab 12/24/20 1128  AST 24  ALT 11  ALKPHOS 48  BILITOT 0.9  PROT 6.3*  ALBUMIN 3.8    CBG: Recent Labs  Lab 12/24/20 1124  GLUCAP 113*    Microbiology Studies:   Recent Results (from the past 240 hour(s))  SARS CORONAVIRUS 2 (TAT 6-24 HRS) Nasopharyngeal Nasopharyngeal Swab     Status: None   Collection Time: 12/24/20 12:40 PM   Specimen: Nasopharyngeal Swab  Result Value Ref Range Status   SARS Coronavirus 2 NEGATIVE NEGATIVE Final    Comment: (NOTE) SARS-CoV-2 target nucleic acids are NOT DETECTED.  The SARS-CoV-2 RNA is generally detectable in upper and lower respiratory specimens during the acute phase of infection. Negative results do not preclude SARS-CoV-2 infection, do not rule out co-infections with other pathogens, and should not be used as the sole basis for treatment or other patient management decisions. Negative results must be combined with clinical observations, patient history,  and epidemiological information. The expected result is Negative.  Fact Sheet for Patients: SugarRoll.be  Fact Sheet for Healthcare Providers: https://www.woods-mathews.com/  This test is not yet approved or cleared by the [place] FDA and  has been authorized for detection and/or diagnosis of SARS-CoV-2 by FDA under an Emergency Use Authorization (EUA). This EUA will remain  in effect (meaning this test can be used) for the duration of the COVID-19 declaration under Se ction 564(b)(1) of the Act, 21 U.S.C. section 360bbb-3(b)(1), unless the authorization is terminated or revoked sooner.  Performed at [facility], [place] [address], [place], [place] [address]      Radiology Studies:  CT CEREBRAL PERFUSION W CONTRAST  Result Date: 12/24/2020 CLINICAL DATA:  Neuro deficit, acute stroke suspected. EXAM: CT ANGIOGRAPHY HEAD  AND NECK CT PERFUSION BRAIN TECHNIQUE: Multidetector CT imaging of the head and neck was performed using the standard protocol during bolus administration of intravenous contrast. Multiplanar CT image reconstructions and MIPs were obtained to evaluate the vascular anatomy. Carotid stenosis measurements (when applicable) are obtained utilizing NASCET criteria, using the distal internal carotid diameter as the denominator. Multiphase CT imaging of the brain was performed following IV bolus contrast injection. Subsequent parametric perfusion maps were calculated using RAPID software. CONTRAST:  149m OMNIPAQUE IOHEXOL 350 MG/ML SOLN COMPARISON:  Same day CT head. FINDINGS: CTA NECK FINDINGS Aortic arch: Predominately calcific atherosclerosis of the aorta. Great vessel origins are patent. Right carotid system: Extensive calcific and noncalcific atherosclerosis at the carotid bifurcation. Severe, nearly occlusive atherosclerosis at the carotid bifurcation with only faint, predominately peripheral opacification in the ICA in the neck and faint, thready opacification at the skull base. Left carotid system: Calcific and noncalcific atherosclerosis at the carotid bifurcation with ulceration. No evidence of greater than 50% stenosis. Vertebral arteries: Mildly right dominant. Tortuous vertebral arteries bilaterally without hemodynamically significant stenosis. Skeleton: Moderate to severe multilevel degenerative disc disease. Multilevel facet/uncovertebral hypertrophy with varying degrees of neural foraminal stenosis. Other neck: Heterogeneous thyroid with multiple large nodules, compatible with goiter. Upper chest: Mucous plugging of an enlarged bronchus in the left upper lobe with distal tree-in-bud opacification Review of the MIP images confirms the above findings CTA HEAD FINDINGS Anterior circulation: Faint/thready opacification through the petrous carotid with complete occlusion of the ICA at the cavernous sinus.  Non-opacification of the right proximal MCA and proximal right A1 ACA. More distal right A2 ACA and distal right MCA branches are likely opacified by collateral flow. Left ICA is patent with mild cavernous ICA stenosis due to atherosclerosis. Severe focal narrowing of the left M1 MCA with distal MCA opacification on the left. Left A1 ACA and left A2 ACA are patent. Posterior circulation: Bilateral intradural vertebral arteries, basilar artery and posterior cerebral arteries are patent. Multifocal moderate to severe stenosis of bilateral P2 PCAs. Venous sinuses: As permitted by contrast timing, patent. Review of the MIP images confirms the above findings CT Brain Perfusion Findings: ASPECTS: 9 CBF (<30%) Volume: 04mPerfusion (Tmax>6.0s) volume: 5846mismatch Volume: 48m80mfarction Location:[facility] IMPRESSION: 1. Severe atherosclerosis at the right carotid bifurcation with severe, nearly occlusive narrowing of the proximal ICA. In the more distal ICA, there is only faint/thready opacification through the petrous carotid with complete occlusion of the ICA at the cavernous sinus. Non-opacification of the right M1 MCA and proximal right A1 ACA. More distal right A2 ACA and distal right MCA branches are likely opacified by collateral flow. 2. On perfusion imaging, there is 58 mL of penumbra in  the posterior right MCA territory. While there are no recent priors to confirm, perfusion findings suggest that the aforementioned occlusion may be in part acute (possibly acute thrombosis superimposed on existing atherosclerosis/stenosis). No evidence of core infarct. 3. Severe narrowing of the left M1 MCA with distal opacification. 4. Multifocal moderate to severe stenosis of bilateral P2 PCAs. 5. Extensive left carotid bifurcation atherosclerosis without greater than 50% narrowing. 6. Mucous plugging of an enlarged bronchus in the left upper lobe with distal tree-in-bud opacification, suggesting underlying bronchial obstruction  (either from fluid/debris or a bronchial lesion) and distal infectious/inflammatory process. Recommend follow-up chest CT in 2-3 months to further evaluate and ensure stability and/or resolution. 7. Heterogeneous thyroid with multiple large nodules, compatible with goiter. Consider follow-up thyroid ultrasound to further characterize. Critical findings discussed with Dr. Cheral Marker at 11:54 a.m. via telephone. Electronically Signed   By: Margaretha Sheffield MD   On: 12/24/2020 12:36   DG CHEST PORT 1 VIEW  Result Date: 12/24/2020 CLINICAL DATA:  CVA, abnormal appearance of the upper chest on CT of the neck EXAM: PORTABLE CHEST 1 VIEW COMPARISON:  12/24/2020, 10/10/2019 FINDINGS: Single frontal view of the chest demonstrates an unremarkable cardiac silhouette. No acute airspace disease, effusion, or pneumothorax. The mucous plugging within the left upper lobe seen on CT is not apparent on this portable radiograph. No acute bony abnormalities. IMPRESSION: 1. No acute intrathoracic process. 2. The left upper lobe mucous plugging seen on CT is not appreciated by radiograph. Previous recommendation for follow-up chest CT in 2-3 months is again recommended. Electronically Signed   By: Randa Ngo M.D.   On: 12/24/2020 17:25   ECHOCARDIOGRAM COMPLETE  Result Date: 12/25/2020    ECHOCARDIOGRAM REPORT   Patient Name:   Jessica Haynes Date of Exam: 12/25/2020 Medical Rec #:  [id number]         Height:       63.0 in Accession #:    [id number]        Weight:       139.3 lb Date of Birth:  11/26/1922         BSA:          1.658 m Patient Age:    89 years          BP:           172/51 mmHg Patient Gender: F                 HR:           88 bpm. Exam Location:  Inpatient Procedure: 2D Echo, Cardiac Doppler and Color Doppler Indications:    Stroke  History:        Patient has no prior history of Echocardiogram examinations.                 Risk Factors:Hypertension.  Sonographer:    Cammy Brochure Referring Phys: [id number]  [place] Shi  Sonographer Comments: Image acquisition challenging due to uncooperative patient. IMPRESSIONS  1. Left ventricular ejection fraction, by estimation, is 60 to 65%. The left ventricle has normal function. The left ventricle has no regional wall motion abnormalities. There is mild left ventricular hypertrophy of the basal and septal segments. Left ventricular diastolic parameters are consistent with Grade I diastolic dysfunction (impaired relaxation).  2. Right ventricular systolic function is normal. The right ventricular size is normal.  3. The pericardial effusion is RA posterior.  4. Small diastolic gradient across MV from MAC and leaflet calcium  no MS . The mitral valve is degenerative. No evidence of mitral valve regurgitation. No evidence of mitral stenosis. Moderate mitral annular calcification.  5. The aortic valve is tricuspid. There is moderate calcification of the aortic valve. Aortic valve regurgitation is not visualized. Mild to moderate aortic valve sclerosis/calcification is present, without any evidence of aortic stenosis.  6. The inferior vena cava is normal in size with greater than 50% respiratory variability, suggesting right atrial pressure of 3 mmHg. FINDINGS  Left Ventricle: Left ventricular ejection fraction, by estimation, is 60 to 65%. The left ventricle has normal function. The left ventricle has no regional wall motion abnormalities. The left ventricular internal cavity size was normal in size. There is  mild left ventricular hypertrophy of the basal and septal segments. Left ventricular diastolic parameters are consistent with Grade I diastolic dysfunction (impaired relaxation). Right Ventricle: The right ventricular size is normal. No increase in right ventricular wall thickness. Right ventricular systolic function is normal. Left Atrium: Left atrial size was normal in size. Right Atrium: Right atrial size was normal in size. Pericardium: Trivial pericardial effusion is  present. The pericardial effusion is RA posterior. Mitral Valve: Small diastolic gradient across MV from MAC and leaflet calcium no MS. The mitral valve is degenerative in appearance. There is moderate thickening of the mitral valve leaflet(s). There is moderate calcification of the mitral valve leaflet(s). Moderate mitral annular calcification. No evidence of mitral valve regurgitation. No evidence of mitral valve stenosis. MV peak gradient, 14.3 mmHg. The mean mitral valve gradient is 6.0 mmHg. Tricuspid Valve: The tricuspid valve is normal in structure. Tricuspid valve regurgitation is mild . No evidence of tricuspid stenosis. Aortic Valve: The aortic valve is tricuspid. There is moderate calcification of the aortic valve. Aortic valve regurgitation is not visualized. Mild to moderate aortic valve sclerosis/calcification is present, without any evidence of aortic stenosis. Aortic valve mean gradient measures 5.0 mmHg. Aortic valve peak gradient measures 7.4 mmHg. Aortic valve area, by VTI measures 2.43 cm. Pulmonic Valve: The pulmonic valve was normal in structure. Pulmonic valve regurgitation is trivial. No evidence of pulmonic stenosis. Aorta: The aortic root is normal in size and structure. Venous: The inferior vena cava is normal in size with greater than 50% respiratory variability, suggesting right atrial pressure of 3 mmHg. IAS/Shunts: No atrial level shunt detected by color flow Doppler.  LEFT VENTRICLE PLAX 2D LVIDd:         3.10 cm  Diastology LVIDs:         2.30 cm  LV e' medial:   6.74 cm/s LV PW:         1.10 cm  LV E/e' medial: 20.4 LV IVS:        1.20 cm LVOT diam:     1.80 cm LV SV:         74 LV SV Index:   44 LVOT Area:     2.54 cm  RIGHT VENTRICLE             IVC RV S prime:     11.70 cm/s  IVC diam: 1.50 cm LEFT ATRIUM             Index       RIGHT ATRIUM          Index LA diam:        3.20 cm 1.93 cm/m  RA Area:     7.45 cm LA Vol (A2C):   35.9 ml 21.65 ml/m RA Volume:  12.00 ml 7.24  ml/m LA Vol (A4C):   35.0 ml 21.11 ml/m LA Biplane Vol: 35.8 ml 21.59 ml/m  AORTIC VALVE AV Area (Vmax):    2.21 cm AV Area (Vmean):   2.32 cm AV Area (VTI):     2.43 cm AV Vmax:           136.00 cm/s AV Vmean:          105.000 cm/s AV VTI:            0.303 m AV Peak Grad:      7.4 mmHg AV Mean Grad:      5.0 mmHg LVOT Vmax:         118.00 cm/s LVOT Vmean:        95.700 cm/s LVOT VTI:          0.289 m LVOT/AV VTI ratio: 0.95  AORTA Ao Root diam: 3.00 cm MITRAL VALVE                TRICUSPID VALVE MV Area (PHT): 8.16 cm     TR Peak grad:   28.1 mmHg MV Area VTI:   2.64 cm     TR Vmax:        265.00 cm/s MV Peak grad:  14.3 mmHg MV Mean grad:  6.0 mmHg     SHUNTS MV Vmax:       1.89 m/s     Systemic VTI:  0.29 m MV Vmean:      110.0 cm/s   Systemic Diam: 1.80 cm MV Decel Time: 93 msec MV E velocity: 137.50 cm/s MV A velocity: 175.00 cm/s MV E/A ratio:  0.79 Jenkins Rouge MD Electronically signed by Jenkins Rouge MD Signature Date/Time: 12/25/2020/10:01:21 AM    Final    CT HEAD CODE STROKE WO CONTRAST  Result Date: 12/24/2020 CLINICAL DATA:  Code stroke.  Flaccid left-sided, facial droop. EXAM: CT HEAD WITHOUT CONTRAST TECHNIQUE: Contiguous axial images were obtained from the base of the skull through the vertex without intravenous contrast. COMPARISON:  CT head October 10, 2019. FINDINGS: Brain: new hypodensity in the high right parietal white matter (series 2, image 19) similar appearance of a perforator infarct in the right basal ganglia extending and overlying corona radiata. Remote lacunar infarct in the left cerebellum. No acute hemorrhage. Patchy moderate white matter hypoattenuation, most likely related to chronic microvascular ischemic disease. No midline shift. No visible extra-axial fluid collection. No hydrocephalus. Similar appearance of a large partially calcified mass along the right middle cranial fossa with extra-axial mass infiltrating the skull base and infratemporal soft tissues, better  characterized on remote MRI from 2011. Vascular: Calcific atherosclerosis. Poorly evaluated vessels at the skull base due to streak artifact. Skull: No acute fracture. Sinuses/Orbits: Similar apparent metal along the anterior aspect of the right globe, of unclear etiology. Surrounding streak artifact. Clear sinuses. Other: No mastoid effusions. ASPECTS [facility] Stroke Program Early CT Score) - Ganglionic level infarction (caudate, lentiform nuclei, internal capsule, insula, M1-M3 cortex): 7 - Supraganglionic infarction (M4-M6 cortex): 2 Total score (0-10 with 10 being normal): 9 Dr. Cheral Marker paged at 11:45 AM. Awaiting call back at the time of dictation. IMPRESSION: 1. New hypodensity in the high right parietal white matter (series 2, image 19), suggestive of age indeterminate but interval white matter infarct. MRI could further evaluate if the patient is able. ASPECTS is 9. 2. Moderate chronic microvascular ischemic disease and remote lacunar infarcts, as detailed above. 3. Similar appearance of a large partially calcified mass  along the right middle cranial fossa with extra-axial mass infiltrating the skull base and infratemporal soft tissues, better characterized on remote MRI from 2011. Suspected surrounding edema in the temporal lobe, although evaluation is limited by streak artifact from apparent metal along the anterior right globe. Electronically Signed   By: Margaretha Sheffield MD   On: 12/24/2020 11:51   CT ANGIO HEAD NECK W WO CM (CODE STROKE)  Result Date: 12/24/2020 CLINICAL DATA:  Neuro deficit, acute stroke suspected. EXAM: CT ANGIOGRAPHY HEAD AND NECK CT PERFUSION BRAIN TECHNIQUE: Multidetector CT imaging of the head and neck was performed using the standard protocol during bolus administration of intravenous contrast. Multiplanar CT image reconstructions and MIPs were obtained to evaluate the vascular anatomy. Carotid stenosis measurements (when applicable) are obtained utilizing NASCET criteria,  using the distal internal carotid diameter as the denominator. Multiphase CT imaging of the brain was performed following IV bolus contrast injection. Subsequent parametric perfusion maps were calculated using RAPID software. CONTRAST:  133m OMNIPAQUE IOHEXOL 350 MG/ML SOLN COMPARISON:  Same day CT head. FINDINGS: CTA NECK FINDINGS Aortic arch: Predominately calcific atherosclerosis of the aorta. Great vessel origins are patent. Right carotid system: Extensive calcific and noncalcific atherosclerosis at the carotid bifurcation. Severe, nearly occlusive atherosclerosis at the carotid bifurcation with only faint, predominately peripheral opacification in the ICA in the neck and faint, thready opacification at the skull base. Left carotid system: Calcific and noncalcific atherosclerosis at the carotid bifurcation with ulceration. No evidence of greater than 50% stenosis. Vertebral arteries: Mildly right dominant. Tortuous vertebral arteries bilaterally without hemodynamically significant stenosis. Skeleton: Moderate to severe multilevel degenerative disc disease. Multilevel facet/uncovertebral hypertrophy with varying degrees of neural foraminal stenosis. Other neck: Heterogeneous thyroid with multiple large nodules, compatible with goiter. Upper chest: Mucous plugging of an enlarged bronchus in the left upper lobe with distal tree-in-bud opacification Review of the MIP images confirms the above findings CTA HEAD FINDINGS Anterior circulation: Faint/thready opacification through the petrous carotid with complete occlusion of the ICA at the cavernous sinus. Non-opacification of the right proximal MCA and proximal right A1 ACA. More distal right A2 ACA and distal right MCA branches are likely opacified by collateral flow. Left ICA is patent with mild cavernous ICA stenosis due to atherosclerosis. Severe focal narrowing of the left M1 MCA with distal MCA opacification on the left. Left A1 ACA and left A2 ACA are patent.  Posterior circulation: Bilateral intradural vertebral arteries, basilar artery and posterior cerebral arteries are patent. Multifocal moderate to severe stenosis of bilateral P2 PCAs. Venous sinuses: As permitted by contrast timing, patent. Review of the MIP images confirms the above findings CT Brain Perfusion Findings: ASPECTS: 9 CBF (<30%) Volume: 069mPerfusion (Tmax>6.0s) volume: 5824mismatch Volume: 14m43mfarction Location:[facility] IMPRESSION: 1. Severe atherosclerosis at the right carotid bifurcation with severe, nearly occlusive narrowing of the proximal ICA. In the more distal ICA, there is only faint/thready opacification through the petrous carotid with complete occlusion of the ICA at the cavernous sinus. Non-opacification of the right M1 MCA and proximal right A1 ACA. More distal right A2 ACA and distal right MCA branches are likely opacified by collateral flow. 2. On perfusion imaging, there is 58 mL of penumbra in the posterior right MCA territory. While there are no recent priors to confirm, perfusion findings suggest that the aforementioned occlusion may be in part acute (possibly acute thrombosis superimposed on existing atherosclerosis/stenosis). No evidence of core infarct. 3. Severe narrowing of the left M1 MCA with distal opacification. 4.  Multifocal moderate to severe stenosis of bilateral P2 PCAs. 5. Extensive left carotid bifurcation atherosclerosis without greater than 50% narrowing. 6. Mucous plugging of an enlarged bronchus in the left upper lobe with distal tree-in-bud opacification, suggesting underlying bronchial obstruction (either from fluid/debris or a bronchial lesion) and distal infectious/inflammatory process. Recommend follow-up chest CT in 2-3 months to further evaluate and ensure stability and/or resolution. 7. Heterogeneous thyroid with multiple large nodules, compatible with goiter. Consider follow-up thyroid ultrasound to further characterize. Critical findings discussed with  Dr. Cheral Marker at 11:54 a.m. via telephone. Electronically Signed   By: Margaretha Sheffield MD   On: 12/24/2020 12:36     Scheduled Meds:   . antiseptic oral rinse  15 mL Topical BID  . aspirin EC  325 mg Oral Daily  . clopidogrel  75 mg Oral Daily  . doxycycline  100 mg Oral Q12H  . levothyroxine  100 mcg Oral QAC breakfast  . OLANZapine zydis  5 mg Oral QHS  . sertraline  100 mg Oral Daily  . sodium chloride flush  3 mL Intravenous Once    Continuous Infusions:     LOS: 1 day     Vernell Leep, MD, [place], [facility]. [facility]    To contact the attending provider between 7A-7P or the covering provider during after hours 7P-7A, please log into the web site [email or web address] and access using universal [place] password for that web site. If you do not have the password, please call the hospital operator.  12/25/2020, 5:13 PM

## 2020-12-26 DIAGNOSIS — C541 Malignant neoplasm of endometrium: Secondary | ICD-10-CM

## 2020-12-26 MED ORDER — ATORVASTATIN CALCIUM 10 MG PO TABS
20.0000 mg | ORAL_TABLET | Freq: Every day | ORAL | Status: DC
  Administered 2020-12-26: 20 mg via ORAL
  Filled 2020-12-26: qty 2

## 2020-12-26 NOTE — Progress Notes (Signed)
OT Cancellation Note  Patient Details Name: Jessica Haynes MRN: [id number] DOB: 24-Apr-1923   Cancelled Treatment:    Reason Eval/Treat Not Completed: Other (comment). Per chart review, pt transitioned to full comfort care measures.  OT will sign off at this time. If further needs arise, please re-consult.   Jolaine Artist, OT Acute Rehabilitation Services Pager [phone] Office [phone]   Jessica Haynes 12/26/2020, 7:56 AM

## 2020-12-26 NOTE — [place] FL2 (Signed)
[place] MEDICAID FL2 LEVEL OF CARE SCREENING TOOL     IDENTIFICATION  Patient Name: Jessica Haynes Birthdate: 04-Jul-1923 Sex: female Admission Date (Current Location): 12/24/2020  County and [place] Number:  Herbalist and Address:  [facility], [place] [address], [place], [place] [address]      Provider Number: [id number]  Attending Physician Name and Address:  Modena Jansky, MD  Relative Name and Phone Number:       Current Level of Care: Hospital Recommended Level of Care: [place] Prior Approval Number:    Date Approved/Denied:   PASRR Number: [id number]  Discharge Plan: SNF    Current Diagnoses: Patient Active Problem List   Diagnosis Date Noted  . TIA (transient ischemic attack) 12/24/2020  . CVA (cerebral vascular accident) ([place]) 12/24/2020  . Abdominal pain, left lower quadrant 12/09/2017  . Diarrhea 04/06/2014  . Deafness in right ear   . Endometrial cancer ([place]) 03/01/2013  . Macular degeneration 06/08/2011  . Bell's palsy 11/25/2009  . VITAMIN B12 DEFICIENCY 11/10/2007  . MENINGIOMA 05/13/2007  . Hypothyroidism 05/13/2007  . HYPERLIPIDEMIA 05/13/2007  . Essential hypertension 05/13/2007  . Other specified disorders of bladder 05/13/2007    Orientation RESPIRATION BLADDER Height & Weight     Self  Normal Incontinent Weight: 139 lb 5.3 oz (63.2 kg) Height:     BEHAVIORAL SYMPTOMS/MOOD NEUROLOGICAL BOWEL NUTRITION STATUS      Incontinent Diet (see DC summary)  AMBULATORY STATUS COMMUNICATION OF NEEDS Skin   Extensive Assist Verbally Skin abrasions                       Personal Care Assistance Level of Assistance  Bathing,Feeding,Dressing Bathing Assistance: Maximum assistance Feeding assistance: Limited assistance Dressing Assistance: Maximum assistance     Functional Limitations Info  Sight,Hearing Sight Info: Impaired Hearing Info: Impaired      SPECIAL CARE FACTORS  FREQUENCY  PT (By licensed PT),OT (By licensed OT),Speech therapy     PT Frequency: 5x/wk OT Frequency: 5x/wk     Speech Therapy Frequency: 5x/wk      Contractures Contractures Info: Not present    Additional Factors Info  Code Status,Allergies,Psychotropic Code Status Info: DNR Allergies Info: Clarithromycin, Neomycin, Penicillins Psychotropic Info: Zyprexa 5mg  daily at bed, Zoloft 100mg  daily         Current Medications (12/26/2020):  This is the current hospital active medication list Current Facility-Administered Medications  Medication Dose Route Frequency Provider Last Rate Last Admin  . acetaminophen (TYLENOL) tablet 650 mg  650 mg Oral Q6H PRN Lin Landsman, NP   650 mg at 12/25/20 2058   Or  . acetaminophen (TYLENOL) suppository 650 mg  650 mg Rectal Q6H PRN Lin Landsman, NP      . antiseptic oral rinse (BIOTENE) solution 15 mL  15 mL Topical BID Elmer Picker M, NP   15 mL at 12/26/20 1045  . aspirin EC tablet 325 mg  325 mg Oral Daily Rosalin Hawking, MD   325 mg at 12/26/20 1045  . clopidogrel (PLAVIX) tablet 75 mg  75 mg Oral Daily Rosalin Hawking, MD   75 mg at 12/26/20 1045  . glycopyrrolate (ROBINUL) tablet 1 mg  1 mg Oral Q4H PRN Lin Landsman, NP       Or  . glycopyrrolate (ROBINUL) injection 0.2 mg  0.2 mg Subcutaneous Q4H PRN Lin Landsman, NP       Or  .  glycopyrrolate (ROBINUL) injection 0.2 mg  0.2 mg Intravenous Q4H PRN Elmer Picker M, NP      . haloperidol (HALDOL) tablet 2 mg  2 mg Oral Q6H PRN Lin Landsman, NP       Or  . haloperidol (HALDOL) 2 MG/ML solution 2 mg  2 mg Sublingual Q6H PRN Lin Landsman, NP       Or  . haloperidol lactate (HALDOL) injection 2 mg  2 mg Intravenous Q6H PRN Elmer Picker M, NP      . HYDROmorphone HCl (DILAUDID) liquid 0.25-0.5 mg  0.25-0.5 mg Oral Q2H PRN Elmer Picker M, NP      . hydroxypropyl methylcellulose / hypromellose (ISOPTO TEARS / GONIOVISC) 2.5 % ophthalmic solution 1 drop  1 drop Both Eyes TID PRN Guilford Shi, MD   1 drop at 12/25/20 2100  . levothyroxine (SYNTHROID) tablet 100 mcg  100 mcg Oral QAC breakfast Guilford Shi, MD   100 mcg at 12/26/20 0616  . LORazepam (ATIVAN) tablet 1 mg  1 mg Oral Q1H PRN Lin Landsman, NP       Or  . LORazepam (ATIVAN) 2 MG/ML concentrated solution 1 mg  1 mg Sublingual Q1H PRN Lin Landsman, NP   1 mg at 12/25/20 2057   Or  . LORazepam (ATIVAN) injection 1 mg  1 mg Intravenous Q1H PRN Elmer Picker M, NP      . OLANZapine zydis (ZYPREXA) disintegrating tablet 5 mg  5 mg Oral QHS Lin Landsman, NP   5 mg at 12/25/20 2100  . ondansetron (ZOFRAN-ODT) disintegrating tablet 4 mg  4 mg Oral Q6H PRN Lin Landsman, NP       Or  . ondansetron [facility]) injection 4 mg  4 mg Intravenous Q6H PRN Elmer Picker M, NP      . senna-docusate (Senokot-S) tablet 1 tablet  1 tablet Oral QHS PRN Guilford Shi, MD      . sertraline (ZOLOFT) tablet 100 mg  100 mg Oral Daily Guilford Shi, MD   100 mg at 12/26/20 1045  . sodium chloride flush (NS) 0.9 % injection 3 mL  3 mL Intravenous Once Little, Wenda Overland, MD         Discharge Medications: Please see discharge summary for a list of discharge medications.  Relevant Imaging Results:  Relevant Lab Results:   Additional Information SS#: [id number]  Geralynn Ochs, LCSW

## 2020-12-26 NOTE — Progress Notes (Signed)
Palliative Medicine RN Note: Rec'd a call from Olen Cordial, pt's POA & son in law. He reports that [place] instructed him to get [facility] after her 3 day stay and to get [facility] to pay for SNF for 21 days, no cost to patient. I explained that pt cannot have hospice and SNF at the same time, and Mr Rosana Berger reports he wants SNF now.  I sent a secure chat to Sherrine Maples with TOC to update them.  Marjie Skiff Avaleigh Decuir, RN, BSN, [facility] Palliative Medicine Team 12/26/2020 10:50 AM Office [phone]

## 2020-12-26 NOTE — Progress Notes (Signed)
Daily Progress Note   Patient Name: Jessica Haynes       Date: 12/26/2020 DOB: 11-18-1922  Age: 87 y.o. MRN#: [id number] Attending Physician: Modena Jansky, MD Primary Care Physician: Maurice Small, MD Admit Date: 12/24/2020  Reason for Consultation/Follow-up: Establishing goals of care, "Goals of care, MOST form"  Subjective: Patient's son-in-law reached spoke with our team RN this morning and stated that he wanted patient to go to SNF for rehab.   15:00--At bedside, patient is resting comfortably. I did not attempt to awaken her.   16:24--I spoke with son-in-law [name] by phone. Confirmed that he wants patient to go to SNF/rehab. Discussed that patient would not be able to receive hospice care while in rehab. Provided education that the focus of rehab is improvement or at least stabilization of functional status, where the focus of hospice is comfort and quality of life within the setting of end-stage illness. Glenn verbalizes understanding. He is hopeful that after she completes rehab, [facility] will have a bed for her in assisted living.  I offered and explained the option for an outpatient palliative referral to check-in periodically with patient and family and continue goals of care discussions. Discussed that outpatient palliative can also help with transition to hospice care in the event of further decline. [name] is agreeable to outpatient palliative referral.      Length of Stay: 2  Current Medications: Scheduled Meds:  . antiseptic oral rinse  15 mL Topical BID  . aspirin EC  325 mg Oral Daily  . clopidogrel  75 mg Oral Daily  . levothyroxine  100 mcg Oral QAC breakfast  . OLANZapine zydis  5 mg Oral QHS  . sertraline  100 mg Oral Daily  . sodium chloride flush  3 mL  Intravenous Once    Continuous Infusions:   PRN Meds: acetaminophen **OR** acetaminophen, glycopyrrolate **OR** glycopyrrolate **OR** glycopyrrolate, haloperidol **OR** haloperidol **OR** haloperidol lactate, HYDROmorphone HCl, hydroxypropyl methylcellulose / hypromellose, LORazepam **OR** LORazepam **OR** LORazepam, ondansetron **OR** ondansetron (ZOFRAN) IV, senna-docusate  Physical Exam Vitals reviewed.  Constitutional:      General: She is sleeping. She is not in acute distress.    Appearance: She is ill-appearing.  Pulmonary:     Effort: Pulmonary effort is normal.  Vital Signs: BP (!) 125/51 (BP Location: Right Arm)   Pulse 79   Temp 97.6 F (36.4 C) (Oral)   Resp 16   Wt 63.2 kg   SpO2 99%   BMI 24.68 kg/m  SpO2: SpO2: 99 % O2 Device: O2 Device: Room Air O2 Flow Rate:    Intake/output summary:   Intake/Output Summary (Last 24 hours) at 12/26/2020 1500 Last data filed at 12/26/2020 1324 Gross per 24 hour  Intake 50 ml  Output --  Net 50 ml   LBM: Last BM Date: 12/25/20 Baseline Weight: Weight: 63.2 kg Most recent weight: Weight: 63.2 kg       Palliative Assessment/Data: PPS 30%     Patient Active Problem List   Diagnosis Date Noted  . TIA (transient ischemic attack) 12/24/2020  . CVA (cerebral vascular accident) ([place]) 12/24/2020  . Abdominal pain, left lower quadrant 12/09/2017  . Diarrhea 04/06/2014  . Deafness in right ear   . Endometrial cancer ([place]) 03/01/2013  . Macular degeneration 06/08/2011  . Bell's palsy 11/25/2009  . VITAMIN B12 DEFICIENCY 11/10/2007  . MENINGIOMA 05/13/2007  . Hypothyroidism 05/13/2007  . HYPERLIPIDEMIA 05/13/2007  . Essential hypertension 05/13/2007  . Other specified disorders of bladder 05/13/2007    Palliative Care Assessment & Plan   HPI/Patient Profile: 87 y.o. female  with past medical history of deafness in right ear, near complete blindness due to macular degeneration, hypertension,  hyperlipidemia, hypothyroidism, right facial droop due to Bell's palsy, and endometrial cancer in 2014 presented to [facility] ED on 12/24/2020 from [facility] after a fall and family concerns of left facial droop, slurred speech, left-sided weakness.  Code stroke was called and neurology consulted.Dr. Cheral Marker spoke with family who requested no heroic measures including no IR intervention. Patient was admitted on 12/24/2020 with acute ischemic right MCA stroke, carotid stenosis, abnormal CT of neck.   ED course: Sodium 142, K4.3, BUN 22, creatinine 1.6-1.8 (baseline), CBC within normal limits, CT head reportedNew hypodensity in the high right parietal white matter suggestive of age indeterminate but interval white matter infarct. MRI could further evaluate if the patient is able, moderate chronic microvascular ischemic disease.Similar appearance of a large partially calcified mass along the right middle cranial fossa with extra-axial mass infiltrating the skull base and infratemporal soft tissues, better characterized on remote MRI from 2011. CT perfusion brain, CTA of head/neck showedSevere atherosclerosis at the right carotid bifurcation with severe, nearly occlusive narrowing of the proximal ICA. In the more distal ICA, there is only faint/thready opacification through the petrous carotid with complete occlusion of the ICA at the cavernous sinus.Possibly acute thrombosis superimposed on existingatherosclerosis/stenosisof right MCA territory. Severe narrowing of the left M1 MCA with distal opacification.Multifocal moderate to severe stenosis of bilateral P2 PCAs.  Patient evaluated by neurology and recommended medical admission with no aggressive interventions/thrombectomy/anticoagulation as per discussion with patient's son.  Recommendations/Plan:  Continue comfort measures  Son is requesting patient go to SNF for rehab  Continue Zyprexa 5 mg qHS for agitation  Continue aspirin, Plavix, and  synthroid for now  Outpatient palliative referral - TOC order placed  PMT will continue to follow  Goals of Care and Additional Recommendations:  Limitations on Scope of Treatment: Full Comfort Care  Code Status:  DNR/DNI  Prognosis:   < 6 months  Discharge Planning:  To Be Determined  Care plan was discussed with case management, LCSW  Thank you for allowing the Palliative Medicine Team to assist in the care of this patient.  Total Time 25 minutes Prolonged Time Billed  no       Greater than 50%  of this time was spent counseling and coordinating care related to the above assessment and plan.  Lavena Bullion, NP  Please contact Palliative Medicine Team phone at [phone] for questions and concerns.

## 2020-12-26 NOTE — Evaluation (Addendum)
Occupational Therapy Evaluation Patient Details Name: Jessica Haynes MRN: [id number] DOB: [birth year] Today's Date: 12/26/2020    History of Present Illness 87 yo female presents to ED on 5/3 with new onset L-sided weakness. CTA of head/neck showed Severe atherosclerosis at the right carotid bifurcation with severe, nearly occlusive narrowing of the proximal ICA; possible acute thrombosis superimposed on existing atherosclerosis/stenosis of R MCA; severe narrowing of L M1 MCA, and severe stenosis of bilateral P2 PCAs. Additionally, CT neck shows mucous plugging of enlarged bronchus in LUL, bronchial obstruction, and distal inflammatory process. PMH includes R ear deafness, endometrial cancer, HLD, HTN, hypothyroidism, right middle cranial fossa meningioma, shingles, R bell's palsy and skin cancer.   Clinical Impression   Pt admitted with above. She demonstrates the below listed deficits and will benefit from continued OT to maximize safety and independence with BADLs.  Family changed their mind and no longer want comfort measures.  Pt presents to OT with Lt hemiparesis, Lt inattention/neglect, impaired balance, decreased activity tolerance.  She currently requires max A - total A for ADLs and today's evaluation was severely limited by pt lethargy (pt received Ativan last pm, and had not slept for ~24 hours per nursing which likely accounts for her lethargy).   Feel pt definitely has rehab potential to increase her ability to perform ADLs and functional transfers with less assistance.  She will benefit from continued OT both acute and post acute to decrease caregiver burden, increase her level of independence, improve her quality of life, and reduce secondary sequelae of immobility.   At this time recommend SNF level rehab.         Follow Up Recommendations  SNF;Supervision/Assistance - 24 hour    Equipment Recommendations  None recommended by OT    Recommendations for Other Services        Precautions / Restrictions Precautions Precautions: Fall Precaution Comments: L inattention, L hemiparesis, periods of grabbing therapists      Mobility Bed Mobility Overal bed mobility: Needs Assistance Bed Mobility: Supine to Sit;Sit to Supine     Supine to sit: Max assist Sit to supine: Max assist   General bed mobility comments: assist with all aspects    Transfers Overall transfer level: Needs assistance Equipment used: 1 person hand held assist Transfers: Sit to/from Stand Sit to Stand: Max assist         General transfer comment: Pt required assist to boost into standing and assist for balance.  She stood x 2, but unable to initiate stepping for transfer    Balance Overall balance assessment: Needs assistance Sitting-balance support: Single extremity supported;Feet supported Sitting balance-Leahy Scale: Poor Sitting balance - Comments: requires max A to maintain static sitting.  Participation limited by lethargy.  Pt with posterior lean   Standing balance support: Single extremity supported Standing balance-Leahy Scale: Poor Standing balance comment: Pt requires max A for static standing.  She tends to stand with Rt foot on top of Lt foot.  Posterior bias noted                           ADL either performed or assessed with clinical judgement   ADL       Grooming: Maximal assistance;Sitting Grooming Details (indicate cue type and reason): able to comb hair with max A  Functional mobility during ADLs: Maximal assistance;+2 for physical assistance;+2 for safety/equipment General ADL Comments: limited by lethargy     Vision   Additional Comments: Pt keeps eyes closed     Perception     Praxis      Pertinent Vitals/Pain Pain Assessment: No/denies pain     Hand Dominance Right   Extremity/Trunk Assessment Upper Extremity Assessment Upper Extremity Assessment: LUE deficits/detail;RUE  deficits/detail RUE Deficits / Details: mitten restraint removed.  Pt pulling at gown, with ROM grossly WFL LUE Deficits / Details: mitten restraint in place. Pt spontaneously moving Lt UE, but unable to assess quality or quantity of movement due to pt lethargy LUE Coordination: decreased gross motor;decreased fine motor       Cervical / Trunk Assessment Cervical / Trunk Assessment: Kyphotic   Communication Communication Communication: Expressive difficulties (dysarthria)   Cognition Arousal/Alertness: Lethargic Behavior During Therapy: Flat affect Overall Cognitive Status: Impaired/Different from baseline Area of Impairment: Orientation;Attention;Following commands                 Orientation Level: Disoriented to;Place;Time;Situation Current Attention Level: Focused   Following Commands: Follows one step commands inconsistently       General Comments: Pt very lethargic this am.   She will follow intermittent simple commands, but lethargy limits her participation this date   General Comments       Exercises     Shoulder Instructions      Home Living Family/patient expects to be discharged to:: Other (Comment) Living Arrangements: Alone Available Help at Discharge: Family;Available PRN/intermittently Type of Home: Independent living facility Home Access: Level entry;Elevator     Home Layout: One level               Home Equipment: Cane - single point   Additional Comments: Pt unable to provide info, but per chart, she was in independent living at [facility] green      Prior Functioning/Environment Level of Independence: Independent with assistive device(s)        Comments: Per chart review, pt was mod I with ADLs PTA including grocery shopine        OT Problem List: Decreased strength;Impaired balance (sitting and/or standing);Decreased activity tolerance;Decreased coordination;Decreased cognition;Decreased safety awareness;Impaired UE functional  use      OT Treatment/Interventions: Self-care/ADL training;Neuromuscular education;DME and/or AE instruction;Therapeutic activities;Cognitive remediation/compensation;Visual/perceptual remediation/compensation;Patient/family education;Balance training    OT Goals(Current goals can be found in the care plan section) Acute Rehab OT Goals OT Goal Formulation: Patient unable to participate in goal setting Time For Goal Achievement: 01/09/21 Potential to Achieve Goals: Good ADL Goals Pt Will Perform Grooming: (P) with min assist;sitting Pt Will Perform Upper Body Bathing: (P) with mod assist;sitting Pt Will Perform Lower Body Bathing: (P) with max assist;sit to/from stand Pt Will Transfer to Toilet: (P) with mod assist;stand pivot transfer;bedside commode Pt Will Perform Toileting - Clothing Manipulation and hygiene: (P) with max assist;sit to/from stand Additional ADL Goal #1: (P) Pt will sustain attention to familiar self care task x 5 mins with min cues  OT Frequency: Min 2X/week   Barriers to D/C: Decreased caregiver support          Co-evaluation              AM-PAC OT "6 Clicks" Daily Activity     Outcome Measure Help from another person eating meals?: A Lot Help from another person taking care of personal grooming?: A Lot Help from another person toileting, which includes using toliet, bedpan, or urinal?:  Total Help from another person bathing (including washing, rinsing, drying)?: A Lot Help from another person to put on and taking off regular upper body clothing?: Total Help from another person to put on and taking off regular lower body clothing?: Total 6 Click Score: 9   End of Session Nurse Communication: Mobility status  Activity Tolerance: Patient limited by lethargy Patient left: in bed;with call bell/phone within reach;with bed alarm set;with restraints reapplied  OT Visit Diagnosis: Unsteadiness on feet (R26.81);Cognitive communication deficit  (R41.841) Symptoms and signs involving cognitive functions: Cerebral infarction                Time: 3903-0092 OT Time Calculation (min): 18 min Charges:  OT Evaluation $OT Eval Moderate Complexity: 1 Mod  Nilsa Nutting., OTR/L Acute Rehabilitation Services Pager [phone] Office [place], [place] 12/26/2020, 1:18 PM

## 2020-12-26 NOTE — Progress Notes (Signed)
PROGRESS NOTE   Jessica Haynes  OVF:[id number]    DOB: 1923-08-03    DOA: 12/24/2020  PCP: Maurice Small, MD   I have briefly reviewed patients previous medical records in [facility].  Chief Complaint  Patient presents with  . Code Stroke    Brief Narrative:  87 year old female with medical history significant for but not limited to hypertension, hyperlipidemia, hypothyroidism, near complete blindness due to macular degeneration, deafness in right ear, right facial droop due to Bell's palsy (has?  Gold seed in eyelid to help close the eyelid), endometrial CA, chronic bladder incontinence and cognitive dysfunction at baseline, resident of independent living facility, presented to the ED after family noted left-sided weakness.  Neurology was consulted.  Neurology and subsequently palliative care team discussed at length with patient's son-in-law who is her healthcare POA who transitioned her to comfort oriented care on 5/4.  However on 5/5, POA called the palliative care team indicating that he now wanted patient to go to SNF for rehab.   Assessment & Plan:  Principal Problem:   CVA (cerebral vascular accident) [facility]) Active Problems:   MENINGIOMA   Hypothyroidism   Bell's palsy   Essential hypertension   Macular degeneration   Endometrial cancer ([place])   Acute ischemic right MCA stroke Has history of right-sided Bell's palsy.  Now new onset of left-sided weakness and left-sided neglect.  CT head showed new hypodensity in the high right parietal white matter suggestive of indeterminate but interval white matter infarct.  Also noted on CT is a large partially calcified mass along the right middle cranial fossa with extra axial mass infiltrating the skull base and infratemporal soft tissues better characterized on remote MRI from 2011.  Suspected surrounding edema in the temporal lobe.  CTA of the head and neck showed severe atherosclerosis of the right carotid bifurcation with severe,  nearly occlusive narrowing of the proximal ICA.  In the more distal ICA, there is only faint/thready opacification through the petrous carotid with complete occlusion of the ICA at the cavernous sinus.  CT perfusion study: 58 mL of penumbra in the posterior right MCA territory.  Perfusion findings suggest that the a forementioned occlusion may be in part acute (possibly acute thrombosis superimposed on existing atherosclerosis/stenosis) but no evidence of core infarct.  2D echo: LVEF 60 to 65% and grade 1 diastolic dysfunction.  LDL 146, A1c 5.6.  Neurology was consulted and discussed in length with patient's son-in-law, POA risks and benefits of mechanical thrombectomy.  He did not want to have invasive measures to manage her medical or surgical condition.  On 5/4, I discussed with him as well and he reiterated nonaggressive evaluation and management, including no MRI especially given metallic object on the right eyelid, wished comfort oriented care.  Palliative care team has since consulted and patient was transitioned to full comfort measures in house.  Discharge disposition being determined by the [facility] team.  POA wishes for patient to return to [facility] with additional support but may not be an option versus home with hospice.  However on 5/5, POA called the palliative care team indicating that he wanted patient now to go to SNF.  OT has seen and feel that she is SNF appropriate.  PT evaluation pending.  Per stroke MD follow-up on 5/5: Stroke likely due to atherosclerosis, was on aspirin 81 Mg daily prior to admission, now recommend aspirin 81 mg daily and Plavix 75 Mg daily for 3 weeks followed by aspirin alone.  Carotid stenosis: Imaging results as above.  No heroic interventions. Hypertension: Home antihypertensives (lisinopril and HCTZ) currently on hold to allow permissive hypertension post acute stroke.  Gradually normalize blood pressure in the 140-160 range in 3 to 5 days.   Hyperlipidemia: LDL 146, goal <70.  Added atorvastatin 20 mg daily. Stage III B CKD: Creatinine appears to be at baseline. Endometrial CA: Abnormal CT of the neck: Mucous plugging of the enlarged bronchus in the left upper lobe with distal tree-in-bud opacification, suggesting underlying bronchial obstruction.  This can be followed outpatient as deemed necessary.  Not appear in any discomfort and is not hypoxic on room air.  She is not a candidate for any kind of aggressive evaluation or management given advanced age and frail status. Hypothyroidism continue Synthroid. Macular degeneration, cognitive decline, hallucinations: Adult failure to thrive: Multifactorial due to very advanced age, frail physical health, multiple severe significant chronic comorbidities complicated by acute illness as above and underlying dementia.  Body mass index is 24.68 kg/m.    DVT prophylaxis:   SCD's   Code Status: DNR Family Communication: I discussed in detail with patient's son-in-law on 5/4, updated care and answered questions Disposition:  Status is: Inpatient  Remains inpatient appropriate because:Inpatient level of care appropriate due to severity of illness   Dispo: The patient is from: ALF              Anticipated d/c is to: SNF              Patient currently is medically stable to d/c.   Difficult to place patient No        Consultants:   Neurology Palliative care medicine  Procedures:   None  Antimicrobials:    Anti-infectives (From admission, onward)   Start     Dose/Rate Route Frequency Ordered Stop   12/24/20 2200  doxycycline (VIBRA-TABS) tablet 100 mg  Status:  Discontinued        100 mg Oral Every 12 hours 12/24/20 1613 12/25/20 1732        Subjective:  Poor historian.  Did receive a dose of Ativan last night for agitation and sleeping this morning.  Objective:   Vitals:   12/25/20 0700 12/25/20 0728 12/25/20 1146 12/26/20 0850  BP: (!) 196/72 (!) 168/62 (!)  186/64 (!) 125/51  Pulse: 92  91 79  Resp: _0 Temp: 97.7 F (36.5 C)  98.5 F (36.9 C) 97.6 F (36.4 C)  TempSrc: Axillary  Axillary Oral  SpO2: 100%  99% 99%  Weight:        General exam: Elderly female, moderately built, frail and chronically ill looking lying comfortably supine in bed Respiratory system: Clear to auscultation.  No increased work of breathing. Cardiovascular system: S1 & S2 heard, RRR. No JVD, murmurs, rubs, gallops or clicks. No pedal edema.  Telemetry was discontinued yesterday because patient kept pulling it off and also she had been changed to comfort oriented care. Gastrointestinal system: Abdomen is nondistended, soft and nontender. No organomegaly or masses felt. Normal bowel sounds heard. Central nervous system: Sleeping but arousable, drifts back to sleep. Extremities: As of yesterday, left upper extremity grade 3 x 5 power, left lower extremity grade 1 x 5 power.  Right limbs grade 4 x 5 power at least. Skin: No rashes, lesions or ulcers Psychiatry: Judgement and insight impaired. Mood & affect appropriate.     Data Reviewed:   I have personally reviewed following labs and imaging studies  CBC: Recent Labs  Lab 12/24/20 1128  WBC 8.6  NEUTROABS 6.8  HGB 12.1  11.2*  HCT 36.8  33.0*  MCV 93.9  PLT 270    Basic Metabolic Panel: Recent Labs  Lab 12/24/20 1128  NA 142  143  K 4.3  4.4  CL 110  110  CO2 25  GLUCOSE 122*  117*  BUN 22  30*  CREATININE 1.66*  1.80*  CALCIUM 9.4    Liver Function Tests: Recent Labs  Lab 12/24/20 1128  AST 24  ALT 11  ALKPHOS 48  BILITOT 0.9  PROT 6.3*  ALBUMIN 3.8    CBG: Recent Labs  Lab 12/24/20 1124  GLUCAP 113*    Microbiology Studies:   Recent Results (from the past 240 hour(s))  SARS CORONAVIRUS 2 (TAT 6-24 HRS) Nasopharyngeal Nasopharyngeal Swab     Status: None   Collection Time: 12/24/20 12:40 PM   Specimen: Nasopharyngeal Swab  Result Value Ref Range Status    SARS Coronavirus 2 NEGATIVE NEGATIVE Final    Comment: (NOTE) SARS-CoV-2 target nucleic acids are NOT DETECTED.  The SARS-CoV-2 RNA is generally detectable in upper and lower respiratory specimens during the acute phase of infection. Negative results do not preclude SARS-CoV-2 infection, do not rule out co-infections with other pathogens, and should not be used as the sole basis for treatment or other patient management decisions. Negative results must be combined with clinical observations, patient history, and epidemiological information. The expected result is Negative.  Fact Sheet for Patients: SugarRoll.be  Fact Sheet for Healthcare Providers: https://www.woods-mathews.com/  This test is not yet approved or cleared by the [place] FDA and  has been authorized for detection and/or diagnosis of SARS-CoV-2 by FDA under an Emergency Use Authorization (EUA). This EUA will remain  in effect (meaning this test can be used) for the duration of the COVID-19 declaration under Se ction 564(b)(1) of the Act, 21 U.S.C. section 360bbb-3(b)(1), unless the authorization is terminated or revoked sooner.  Performed at [facility], [place] [address] [place], [place] [address]      Radiology Studies:  DG CHEST PORT 1 VIEW  Result Date: 12/24/2020 CLINICAL DATA:  CVA, abnormal appearance of the upper chest on CT of the neck EXAM: PORTABLE CHEST 1 VIEW COMPARISON:  12/24/2020, 10/10/2019 FINDINGS: Single frontal view of the chest demonstrates an unremarkable cardiac silhouette. No acute airspace disease, effusion, or pneumothorax. The mucous plugging within the left upper lobe seen on CT is not apparent on this portable radiograph. No acute bony abnormalities. IMPRESSION: 1. No acute intrathoracic process. 2. The left upper lobe mucous plugging seen on CT is not appreciated by radiograph. Previous recommendation for follow-up chest CT in 2-3  months is again recommended. Electronically Signed   By: Randa Ngo M.D.   On: 12/24/2020 17:25   ECHOCARDIOGRAM COMPLETE  Result Date: 12/25/2020    ECHOCARDIOGRAM REPORT   Patient Name:   NEYA CREEGAN Date of Exam: 12/25/2020 Medical Rec #:  [id number]         Height:       63.0 in Accession #:    [id number]        Weight:       139.3 lb Date of Birth:  1923-04-13         BSA:          1.658 m Patient Age:    89 years          BP:  172/51 mmHg Patient Gender: F                 HR:           88 bpm. Exam Location:  Inpatient Procedure: 2D Echo, Cardiac Doppler and Color Doppler Indications:    Stroke  History:        Patient has no prior history of Echocardiogram examinations.                 Risk Factors:Hypertension.  Sonographer:    Cammy Brochure Referring Phys: [id number] [place] Shi  Sonographer Comments: Image acquisition challenging due to uncooperative patient. IMPRESSIONS  1. Left ventricular ejection fraction, by estimation, is 60 to 65%. The left ventricle has normal function. The left ventricle has no regional wall motion abnormalities. There is mild left ventricular hypertrophy of the basal and septal segments. Left ventricular diastolic parameters are consistent with Grade I diastolic dysfunction (impaired relaxation).  2. Right ventricular systolic function is normal. The right ventricular size is normal.  3. The pericardial effusion is RA posterior.  4. Small diastolic gradient across MV from MAC and leaflet calcium no MS . The mitral valve is degenerative. No evidence of mitral valve regurgitation. No evidence of mitral stenosis. Moderate mitral annular calcification.  5. The aortic valve is tricuspid. There is moderate calcification of the aortic valve. Aortic valve regurgitation is not visualized. Mild to moderate aortic valve sclerosis/calcification is present, without any evidence of aortic stenosis.  6. The inferior vena cava is normal in size with greater than 50%  respiratory variability, suggesting right atrial pressure of 3 mmHg. FINDINGS  Left Ventricle: Left ventricular ejection fraction, by estimation, is 60 to 65%. The left ventricle has normal function. The left ventricle has no regional wall motion abnormalities. The left ventricular internal cavity size was normal in size. There is  mild left ventricular hypertrophy of the basal and septal segments. Left ventricular diastolic parameters are consistent with Grade I diastolic dysfunction (impaired relaxation). Right Ventricle: The right ventricular size is normal. No increase in right ventricular wall thickness. Right ventricular systolic function is normal. Left Atrium: Left atrial size was normal in size. Right Atrium: Right atrial size was normal in size. Pericardium: Trivial pericardial effusion is present. The pericardial effusion is RA posterior. Mitral Valve: Small diastolic gradient across MV from MAC and leaflet calcium no MS. The mitral valve is degenerative in appearance. There is moderate thickening of the mitral valve leaflet(s). There is moderate calcification of the mitral valve leaflet(s). Moderate mitral annular calcification. No evidence of mitral valve regurgitation. No evidence of mitral valve stenosis. MV peak gradient, 14.3 mmHg. The mean mitral valve gradient is 6.0 mmHg. Tricuspid Valve: The tricuspid valve is normal in structure. Tricuspid valve regurgitation is mild . No evidence of tricuspid stenosis. Aortic Valve: The aortic valve is tricuspid. There is moderate calcification of the aortic valve. Aortic valve regurgitation is not visualized. Mild to moderate aortic valve sclerosis/calcification is present, without any evidence of aortic stenosis. Aortic valve mean gradient measures 5.0 mmHg. Aortic valve peak gradient measures 7.4 mmHg. Aortic valve area, by VTI measures 2.43 cm. Pulmonic Valve: The pulmonic valve was normal in structure. Pulmonic valve regurgitation is trivial. No evidence  of pulmonic stenosis. Aorta: The aortic root is normal in size and structure. Venous: The inferior vena cava is normal in size with greater than 50% respiratory variability, suggesting right atrial pressure of 3 mmHg. IAS/Shunts: No atrial level shunt detected by color flow  Doppler.  LEFT VENTRICLE PLAX 2D LVIDd:         3.10 cm  Diastology LVIDs:         2.30 cm  LV e' medial:   6.74 cm/s LV PW:         1.10 cm  LV E/e' medial: 20.4 LV IVS:        1.20 cm LVOT diam:     1.80 cm LV SV:         74 LV SV Index:   44 LVOT Area:     2.54 cm  RIGHT VENTRICLE             IVC RV S prime:     11.70 cm/s  IVC diam: 1.50 cm LEFT ATRIUM             Index       RIGHT ATRIUM          Index LA diam:        3.20 cm 1.93 cm/m  RA Area:     7.45 cm LA Vol (A2C):   35.9 ml 21.65 ml/m RA Volume:   12.00 ml 7.24 ml/m LA Vol (A4C):   35.0 ml 21.11 ml/m LA Biplane Vol: 35.8 ml 21.59 ml/m  AORTIC VALVE AV Area (Vmax):    2.21 cm AV Area (Vmean):   2.32 cm AV Area (VTI):     2.43 cm AV Vmax:           136.00 cm/s AV Vmean:          105.000 cm/s AV VTI:            0.303 m AV Peak Grad:      7.4 mmHg AV Mean Grad:      5.0 mmHg LVOT Vmax:         118.00 cm/s LVOT Vmean:        95.700 cm/s LVOT VTI:          0.289 m LVOT/AV VTI ratio: 0.95  AORTA Ao Root diam: 3.00 cm MITRAL VALVE                TRICUSPID VALVE MV Area (PHT): 8.16 cm     TR Peak grad:   28.1 mmHg MV Area VTI:   2.64 cm     TR Vmax:        265.00 cm/s MV Peak grad:  14.3 mmHg MV Mean grad:  6.0 mmHg     SHUNTS MV Vmax:       1.89 m/s     Systemic VTI:  0.29 m MV Vmean:      110.0 cm/s   Systemic Diam: 1.80 cm MV Decel Time: 93 msec MV E velocity: 137.50 cm/s MV A velocity: 175.00 cm/s MV E/A ratio:  0.79 Jenkins Rouge MD Electronically signed by Jenkins Rouge MD Signature Date/Time: 12/25/2020/10:01:21 AM    Final      Scheduled Meds:   . antiseptic oral rinse  15 mL Topical BID  . aspirin EC  325 mg Oral Daily  . clopidogrel  75 mg Oral Daily  . levothyroxine   100 mcg Oral QAC breakfast  . OLANZapine zydis  5 mg Oral QHS  . sertraline  100 mg Oral Daily  . sodium chloride flush  3 mL Intravenous Once    Continuous Infusions:     LOS: 2 days     Vernell Leep, MD, [place], [facility]. [facility]    To contact the attending  provider between 7A-7P or the covering provider during after hours 7P-7A, please log into the web site [email or web address] and access using universal [place] password for that web site. If you do not have the password, please call the hospital operator.  12/26/2020, 5:07 PM

## 2020-12-26 NOTE — TOC Initial Note (Signed)
Transition of Care [facility]) - Initial/Assessment Note    Patient Details  Name: Jessica Haynes MRN: [id number] Date of Birth: [birth year]  Transition of Care [facility] CM/SW Contact:    Pollie Friar, RN Phone Number: 12/26/2020, 12:01 PM  Clinical Narrative:                 Patient has been living at [facility]. Family initially wanting comfort care but has changed their mind and wants to see if she can participate and qualify for rehab. CM has asked PT/OT to resee.  CM has spoken with SIL and he is on board with plans.  ToC following.  Expected Discharge Plan: Skilled Nursing Facility Barriers to Discharge: Continued Medical Work up   Patient Goals and CMS Choice   CMS Medicare.gov Compare Post Acute Care list provided to:: Patient Represenative (must comment) Choice offered to / list presented to : Adult Children  Expected Discharge Plan and Services Expected Discharge Plan: [place]   Discharge Planning Services: CM Consult Post Acute Care Choice: [place] Living arrangements for the past 2 months: [place]                                      Prior Living Arrangements/Services Living arrangements for the past 2 months: [place] Lives with:: Facility Resident Patient language and need for interpreter reviewed:: Yes Do you feel safe going back to the place where you live?: Yes        Care giver support system in place?: Yes (comment)   Criminal Activity/Legal Involvement Pertinent to Current Situation/Hospitalization: No - Comment as needed  Activities of Daily Living      Permission Sought/Granted                  Emotional Assessment Appearance:: Appears stated age     Orientation: : Oriented to Self   Psych Involvement: No (comment)  Admission diagnosis:  TIA (transient ischemic attack) [G45.9] CVA (cerebral vascular accident) ([place])  [I63.9] Abnormal chest CT [R93.89] Cerebrovascular accident (CVA), unspecified mechanism ([place]) [I63.9] Patient Active Problem List   Diagnosis Date Noted  . TIA (transient ischemic attack) 12/24/2020  . CVA (cerebral vascular accident) ([place]) 12/24/2020  . Abdominal pain, left lower quadrant 12/09/2017  . Diarrhea 04/06/2014  . Deafness in right ear   . Endometrial cancer ([place]) 03/01/2013  . Macular degeneration 06/08/2011  . Bell's palsy 11/25/2009  . VITAMIN B12 DEFICIENCY 11/10/2007  . MENINGIOMA 05/13/2007  . Hypothyroidism 05/13/2007  . HYPERLIPIDEMIA 05/13/2007  . Essential hypertension 05/13/2007  . Other specified disorders of bladder 05/13/2007   PCP:  Maurice Small, MD Pharmacy:   [facility], [place] [address] Phone: [phone] Fax: [phone]     Social Determinants of Health (SDOH) Interventions    Readmission Risk Interventions No flowsheet data found.

## 2020-12-26 NOTE — Progress Notes (Signed)
PT Cancellation Note and Discharge  Patient Details Name: Jessica Haynes MRN: [id number] DOB: [birth year]   Cancelled Treatment:    Reason Eval/Treat Not Completed: Per chart review, pt transitioned to full comfort measures. PT will sign off at this time. If needs change, please reconsult.    Thelma Comp 12/26/2020, 7:12 AM   Rolinda Roan, PT, DPT Acute Rehabilitation Services Pager: [phone] Office: [phone]

## 2020-12-26 NOTE — Progress Notes (Signed)
SLP Cancellation Note  Patient Details Name: Jessica Haynes MRN: [id number] DOB: 1922-12-21   Cancelled treatment:       Reason Eval/Treat Not Completed: Other (comment) (Pt transitioned to comfort measures; ST will s/o at this time.)Please re-consult prn.   Elvina Sidle, M.S., [place] 12/26/2020, 10:00 AM

## 2020-12-26 NOTE — Progress Notes (Signed)
Has slept off and on today for most of the day.  No agitation or signs of pain.  Denies pain.  No complaints.  Refused to eat but did drink an ensure.  Follows simple commands then back to sleep.

## 2020-12-26 NOTE — Plan of Care (Signed)
  Problem: Clinical Measurements: Goal: Will remain free from infection Outcome: Progressing Goal: Cardiovascular complication will be avoided Outcome: Progressing   Problem: Activity: Goal: Risk for activity intolerance will decrease Outcome: Progressing   

## 2020-12-27 NOTE — TOC Progression Note (Addendum)
Transition of Care [facility]) - Progression Note    Patient Details  Name: RAENAH MURLEY MRN: [id number] Date of Birth: 1922-12-15  Transition of Care [facility]) CM/SW Contact  Pollie Friar, RN Phone Number: 12/27/2020, 1:29 PM  Clinical Narrative:    CM called [facility] and provided bed offers for SNF. He selected [facility]. CM will start insurance.  TOC following.   Expected Discharge Plan: [place] Barriers to Discharge: Continued Medical Work up  Expected Discharge Plan and Services Expected Discharge Plan: [place]   Discharge Planning Services: CM Consult Post Acute Care Choice: [place] Living arrangements for the past 2 months: [place]                                       Social Determinants of Health (SDOH) Interventions    Readmission Risk Interventions No flowsheet data found.

## 2020-12-27 NOTE — Care Management Important Message (Signed)
Important Message  Patient Details  Name: Jessica Haynes MRN: [id number] Date of Birth: 1922-11-11   Medicare Important Message Given:  Yes     Malikhi Ogan [address] Circle 12/27/2020, 2:54 PM

## 2020-12-27 NOTE — Plan of Care (Signed)

## 2020-12-27 NOTE — Progress Notes (Signed)
IV haldol 0.4 ml given to patient for agitation, and dilaudid 0.5 mg given to patient for pain. at 1627. Patient tolerated medication well. No distress noted at this time. Will continue to support patient.

## 2020-12-27 NOTE — Progress Notes (Signed)
Patient is noted to be highly agitated this am. She tried getting out of bed stating, "take me out of this place, I want to leave." Patient also complains of pain  Emotional and physical support given to patient, since she would not respond to any of these, IV haldol 2 mg given for agitation. 0.5 mg dilaudid given to patient for pain at 0837. Patient is resting time. Will continue to support.

## 2020-12-27 NOTE — Evaluation (Signed)
Physical Therapy Evaluation Patient Details Name: Jessica Haynes MRN: [id number] DOB: 22-Feb-1923 Today's Date: 12/27/2020   History of Present Illness  87 yo female presents to ED on 5/3 with new onset L-sided weakness. CTA of head/neck showed Severe atherosclerosis at the right carotid bifurcation with severe, nearly occlusive narrowing of the proximal ICA; possible acute thrombosis superimposed on existing atherosclerosis/stenosis of R MCA; severe narrowing of L M1 MCA, and severe stenosis of bilateral P2 PCAs. Additionally, CT neck shows mucous plugging of enlarged bronchus in LUL, bronchial obstruction, and distal inflammatory process. PMH includes R ear deafness, endometrial cancer, HLD, HTN, hypothyroidism, right middle cranial fossa meningioma, shingles, R bell's palsy and skin cancer.  Clinical Impression   Pt presents with generalized weakness L>R s/p CVA, L inattention, impaired sitting and standing balance, and decreased activity tolerance vs baseline. Pt to benefit from acute PT to address deficits. Pt very participatory in PT session today, initiating move to and from EOB. Pt also tolerating increased static and dynamic seated tasks, with focus on attending to L and midline posture. Pt overall requiring max assist for bed mobility and transition into standing, but demonstrates good command following and potential for mobility progression with both acute and Haynes-acute rehab. PT recommending ST-SNF to improve mobility, decrease fall risk, and decrease caregiver burden in the future. PT to progress mobility as tolerated, and will continue to follow acutely.   Pt with decreased responsiveness Haynes-standing trials x2, VSS (BP 154/64, HR 105 bpm, SpO2 99% on RA); suspect due to pt fatigue. RN notified.     Follow Up Recommendations SNF;Supervision/Assistance - 24 hour    Equipment Recommendations  Hospital bed;3in1 (PT)    Recommendations for Other Services       Precautions /  Restrictions Precautions Precautions: Fall Precaution Comments: L inattention, L hemiparesis, periods of grabbing therapists Restrictions Weight Bearing Restrictions: No      Mobility  Bed Mobility Overal bed mobility: Needs Assistance Bed Mobility: Supine to Sit;Sit to Supine;Rolling Rolling: Mod assist;Max assist   Supine to sit: Max assist Sit to supine: Max assist   General bed mobility comments: Max assist for trunk and LE management, pt initiating LE translation to EOB and elevating trunk. Pt with decreased responsiveness upon return to supine.    Transfers Overall transfer level: Needs assistance Equipment used: 2 person hand held assist Transfers: Sit to/from Stand Sit to Stand: Max assist;+2 physical assistance         General transfer comment: Max +2 for initial power up, rise, hip extension via posterior pelvic facilitation, and steadying upon standing. STS x2 from EOB, with standing tolerance x15 seconds.  Ambulation/Gait             General Gait Details: unable this day  Stairs            Wheelchair Mobility    Modified Rankin (Stroke Patients Only) Modified Rankin (Stroke Patients Only) Pre-Morbid Rankin Score: Slight disability Modified Rankin: Severe disability     Balance Overall balance assessment: Needs assistance Sitting-balance support: Single extremity supported;Feet supported Sitting balance-Leahy Scale: Fair Sitting balance - Comments: able to sit EOB without PT assist, participates well in dynamic tasks with PT active assist. Occasional posterior and L lateral leaning, corrected with min PT assist. EOB siting x10 minutes   Standing balance support: Bilateral upper extremity supported Standing balance-Leahy Scale: Zero Standing balance comment: max +2  Pertinent Vitals/Pain Pain Assessment: Faces Faces Pain Scale: Hurts little more Pain Location: head, neck along R Pain Descriptors /  Indicators: Sore;Discomfort;Grimacing Pain Intervention(s): Limited activity within patient's tolerance;Monitored during session;Repositioned    Home Living Family/patient expects to be discharged to:: Other (Comment) Living Arrangements: Alone Available Help at Discharge: Family;Available PRN/intermittently Type of Home: Independent living facility Home Access: Level entry;Elevator     Home Layout: One level Home Equipment: Cane - single point Additional Comments: Pt unable to provide info, but per chart, she was in independent living at [facility]    Prior Function Level of Independence: Independent with assistive device(s)         Comments: Per chart review, pt was mod I with ADLs PTA including grocery shopping     Hand Dominance   Dominant Hand: Right    Extremity/Trunk Assessment   Upper Extremity Assessment Upper Extremity Assessment: Defer to OT evaluation    Lower Extremity Assessment Lower Extremity Assessment: Generalized weakness;Difficult to assess due to impaired cognition LLE Deficits / Details: active hip and knee flex/ext observed during session, formal MMT not conducted secondary to cognition. L inattention LLE Sensation: decreased proprioception    Cervical / Trunk Assessment Cervical / Trunk Assessment: Kyphotic  Communication   Communication: Expressive difficulties (dysarthria)  Cognition Arousal/Alertness: Awake/alert Behavior During Therapy: Restless;Anxious Overall Cognitive Status: Impaired/Different from baseline Area of Impairment: Orientation;Attention;Memory;Following commands;Safety/judgement;Awareness;Problem solving                 Orientation Level: Disoriented to;Place;Time;Situation Current Attention Level: Sustained Memory: Decreased short-term memory Following Commands: Follows one step commands with increased time Safety/Judgement: Decreased awareness of deficits Awareness: Intellectual Problem Solving: Decreased  initiation;Difficulty sequencing;Requires verbal cues;Requires tactile cues General Comments: Pt able to state name, telling PT to go get her son-in-law Jessica Haynes to help with mobility. Pt repeatedly states during session "why did they send this little girl (referring to this PT) to help me get up? I need a man to do it". Pt fearful of falling, stating "I am going to the floor, I am going to fall" several times. Pt following commands fairly consistently ~80% of the time. Pt requires max cuing to attend to L. Pt slightly paranoid, stating "these people hurt me, my neck and head hurts, I don't like these people and they don't like me".      General Comments      Exercises Other Exercises Other Exercises: Seated balance tasks: static sitting with LEs blocked, CL reaching RUE to LLE x5, LUE/RUE shoulder elevation in sitting   Assessment/Plan    PT Assessment Patient needs continued PT services  PT Problem List Decreased strength;Decreased mobility;Decreased safety awareness;Decreased activity tolerance;Decreased balance;Decreased knowledge of use of DME;Decreased cognition;Decreased coordination       PT Treatment Interventions DME instruction;Therapeutic activities;Therapeutic exercise;Patient/family education;Balance training;Functional mobility training;Neuromuscular re-education;Gait training    PT Goals (Current goals can be found in the Care Plan section)  Acute Rehab PT Goals Patient Stated Goal: get my cane and walk PT Goal Formulation: With patient Time For Goal Achievement: 01/10/21 Potential to Achieve Goals: Fair    Frequency Min 3X/week   Barriers to discharge        Co-evaluation               AM-PAC PT "6 Clicks" Mobility  Outcome Measure Help needed turning from your back to your side while in a flat bed without using bedrails?: A Lot Help needed moving from lying on your back to sitting on  the side of a flat bed without using bedrails?: Total Help needed moving  to and from a bed to a chair (including a wheelchair)?: Total Help needed standing up from a chair using your arms (e.g., wheelchair or bedside chair)?: Total Help needed to walk in hospital room?: Total Help needed climbing 3-5 steps with a railing? : Total 6 Click Score: 7    End of Session Equipment Utilized During Treatment: Gait belt Activity Tolerance: Patient limited by fatigue;Patient tolerated treatment well Patient left: in bed;with call bell/phone within reach;with bed alarm set Nurse Communication: Mobility status PT Visit Diagnosis: Other abnormalities of gait and mobility (R26.89);Difficulty in walking, not elsewhere classified (R26.2);Hemiplegia and hemiparesis Hemiplegia - Right/Left: Left Hemiplegia - dominant/non-dominant: Non-dominant Hemiplegia - caused by: Cerebral infarction    Time: 1210-1241 PT Time Calculation (min) (ACUTE ONLY): 31 min   Charges:   PT Evaluation $PT Eval Low Complexity: 1 Low PT Treatments $Neuromuscular Re-education: 8-22 mins       Stacie Glaze, PT DPT Acute Rehabilitation Services Pager [phone]  Office [phone]   [place] 12/27/2020, 1:30 PM

## 2020-12-27 NOTE — Progress Notes (Signed)
[facility]  PROGRESS NOTE  Jessica Haynes JKD:[id number] DOB: [birth year] DOA: 12/24/2020 PCP: Maurice Small, MD   Brief HPI:   87 year old female with history of hypertension, hyperlipidemia, hypothyroidism, near complete blindness due to macular degeneration, deafness in right ear, right facial droop due to Bell's palsy, has cold seeding eyelid to help close eyelid, endometrial carcinoma, chronic bladder incontinence and cognitive dysfunction at baseline, resident of independent living facility presented to ED after family noted left-sided weakness.  Neurology was consulted.  After discussion with palliative care and neurology patient's son-in-law who is the healthcare power of attorney transition her to comfort care on 12/25/2020.  However on 5/5 POA called the palliative care team indicating that he wanted her to go to skilled nursing facility for rehab.    Subjective   Patient seen and examined, no new complaints.   Assessment/Plan:     1. Acute ischemic right MCA infarct-patient has history of Isobel palsy, she had no onset of left-sided weakness and left-sided neglect.  CT head showed new hypodensity in the right parietal white matter suggestive of indeterminate but interval white matter infarct.  CTA of the head and neck showed severe atherosclerosis of the right carotid bifurcation with severe mid occlusive narrowing of the proximal ICA.  In the motor slightly there is only faint/static opacification through the petrous carotid with complete occlusion of the ICA at the cavernous sinuses.  CT perfusion study showed 58 mL of penumbra in the posterior right MCA territory.  Perfusion findings suggestive of forementioned occlusion may be in part acute but no evidence of core infarct.  2D echo showed LVEF 60 to 65% and grade 1 diastolic dysfunction.  LDL 146, A1c 5.6.  Neurology was consulted and discussing at length patient's son-in-law who is also POA risks and benefits of mechanical  thrombectomy.  He did not want her to have invasive measures to manage her medical or surgical condition.  On 5/4 Dr. Algis Liming discussed with patient's family including no MRI given metal metallic object in right eyelid at that time they wish comfort care only.  POA wishes are to return to [facility] with additional support versus home with hospice.  However on 5/5 POA called palliative care team indicating he wanted her to go to skilled nursing facility.  Per stroke MD follow-up on 5/5, stroke is likely due to atherosclerosis, was on aspirin 81 mg daily prior to admission, now recommended aspirin 81 mg daily and Plavix 75 mg daily for 3 weeks followed by aspirin alone. 2. Carotid stenosis-no aggressive interventions as above. 3. Hypertension-Home medication including lisinopril and HCTZ are on hold for permissive hypertension due to stroke.  Gradually normalize blood pressure in 140-160 range in next 3 to 5 days. 4. Abnormal CT of the neck-mucous plugging of the enlarged bronchus in the left upper lobe and distal tree-in-bud opacification suggesting underlying bronchial obstruction.  This can be followed up as outpatient as deemed necessary.  Patient is not hypoxemic, not candidate for aggressive management due to advanced age and frail status. 5. Macular degeneration-cognitive decline, hallucinations-multifactorial due to advanced age, frailty physical health, complicated by acute illness as above as well as dementia.   Scheduled medications:   . antiseptic oral rinse  15 mL Topical BID  . aspirin EC  325 mg Oral Daily  . atorvastatin  20 mg Oral Daily  . clopidogrel  75 mg Oral Daily  . levothyroxine  100 mcg Oral QAC breakfast  . OLANZapine zydis  5 mg Oral  QHS  . sertraline  100 mg Oral Daily  . sodium chloride flush  3 mL Intravenous Once         Data Reviewed:   CBG:  Recent Labs  Lab 12/24/20 1124  GLUCAP 113*    SpO2: 99 %    Vitals:   12/25/20 0700 12/25/20 0728  12/25/20 1146 12/26/20 0850  BP: (!) 196/72 (!) 168/62 (!) 186/64 (!) 125/51  Pulse: 92  91 79  Resp: 16  16 16   Temp: 97.7 F (36.5 C)  98.5 F (36.9 C) 97.6 F (36.4 C)  TempSrc: Axillary  Axillary Oral  SpO2: 100%  99% 99%  Weight:         Intake/Output Summary (Last 24 hours) at 12/27/2020 1956 Last data filed at 12/27/2020 0537 Gross per 24 hour  Intake 20 ml  Output --  Net 20 ml    05/05 0701 - 05/06 1900 In: 317 [P.O.:317] Out: -   Filed Weights   12/24/20 1100  Weight: 63.2 kg    CBC:  Recent Labs  Lab 12/24/20 1128  WBC 8.6  HGB 12.1  11.2*  HCT 36.8  33.0*  PLT 177  MCV 93.9  MCH 30.9  MCHC 32.9  RDW 13.3  LYMPHSABS 1.1  MONOABS 0.6  EOSABS 0.0  BASOSABS 0.0    Complete metabolic panel:  Recent Labs  Lab 12/24/20 1128 12/25/20 0411  NA 142  143  --   K 4.3  4.4  --   CL 110  110  --   CO2 25  --   GLUCOSE 122*  117*  --   BUN 22  30*  --   CREATININE 1.66*  1.80*  --   CALCIUM 9.4  --   AST 24  --   ALT 11  --   ALKPHOS 48  --   BILITOT 0.9  --   ALBUMIN 3.8  --   INR 1.0  --   HGBA1C  --  5.6    No results for input(s): LIPASE, AMYLASE in the last 168 hours.  Recent Labs  Lab 12/24/20 1240  SARSCOV2NAA NEGATIVE    ------------------------------------------------------------------------------------------------------------------ Recent Labs    12/25/20 0411  CHOL 224*  HDL 50  LDLCALC 146*  TRIG 140  CHOLHDL 4.5    Lab Results  Component Value Date   HGBA1C 5.6 12/25/2020   ------------------------------------------------------------------------------------------------------------------ No results for input(s): TSH, T4TOTAL, T3FREE, THYROIDAB in the last 72 hours.  Invalid input(s): FREET3 ------------------------------------------------------------------------------------------------------------------ No results for input(s): VITAMINB12, FOLATE, FERRITIN, TIBC, IRON, RETICCTPCT in the last 72  hours.  Coagulation profile Recent Labs  Lab 12/24/20 1128  INR 1.0   No results for input(s): DDIMER in the last 72 hours.  Cardiac Enzymes No results for input(s): CKTOTAL, CKMB, CKMBINDEX, TROPONINI in the last 168 hours.  ------------------------------------------------------------------------------------------------------------------ No results found for: BNP   Antibiotics: Anti-infectives (From admission, onward)   Start     Dose/Rate Route Frequency Ordered Stop   12/24/20 2200  doxycycline (VIBRA-TABS) tablet 100 mg  Status:  Discontinued        100 mg Oral Every 12 hours 12/24/20 1613 12/25/20 1732       Radiology Reports  No results found.    DVT prophylaxis: SCDs  Code Status: Full code  Family Communication: No family at bedside   Consultants:  Neurology  Palliative care  Procedures:      Objective    Physical Examination:   General-appears in no acute distress  Heart-S1-S2, regular, no murmur auscultated Lungs-clear to auscultation bilaterally, no wheezing or crackles auscultated Abdomen-soft, nontender, no organomegaly Extremities-no edema in the lower extremities Neuro-alert, oriented x3, weakness in left upper and lower extremity Status is: Inpatient  Dispo: The patient is from: Home              Anticipated d/c is to: Skilled nursing facility              Anticipated d/c date is: 12/30/2020              Patient currently not stable for discharge  Barrier to discharge-none  COVID-19 Labs  No results for input(s): DDIMER, FERRITIN, LDH, CRP in the last 72 hours.  Lab Results  Component Value Date   [place] NEGATIVE 12/24/2020    Microbiology  Recent Results (from the past 240 hour(s))  SARS CORONAVIRUS 2 (TAT 6-24 HRS) Nasopharyngeal Nasopharyngeal Swab     Status: None   Collection Time: 12/24/20 12:40 PM   Specimen: Nasopharyngeal Swab  Result Value Ref Range Status   SARS Coronavirus 2 NEGATIVE NEGATIVE Final     Comment: (NOTE) SARS-CoV-2 target nucleic acids are NOT DETECTED.  The SARS-CoV-2 RNA is generally detectable in upper and lower respiratory specimens during the acute phase of infection. Negative results do not preclude SARS-CoV-2 infection, do not rule out co-infections with other pathogens, and should not be used as the sole basis for treatment or other patient management decisions. Negative results must be combined with clinical observations, patient history, and epidemiological information. The expected result is Negative.  Fact Sheet for Patients: SugarRoll.be  Fact Sheet for Healthcare Providers: https://www.woods-mathews.com/  This test is not yet approved or cleared by the [place] FDA and  has been authorized for detection and/or diagnosis of SARS-CoV-2 by FDA under an Emergency Use Authorization (EUA). This EUA will remain  in effect (meaning this test can be used) for the duration of the COVID-19 declaration under Se ction 564(b)(1) of the Act, 21 U.S.C. section 360bbb-3(b)(1), unless the authorization is terminated or revoked sooner.  Performed at [facility], [place] [address], [place], [place] [address]              [facility] If 7PM-7AM, please contact night-coverage at [email or web address], Office  [phone]   12/27/2020, 7:56 PM  LOS: 3 days

## 2020-12-27 NOTE — Progress Notes (Signed)
Daily Progress Note   Patient Name: Jessica Haynes       Date: 12/27/2020 DOB: 30-Mar-1923  Age: 87 y.o. MRN#: [id number] Attending Physician: Oswald Hillock, MD Primary Care Physician: Maurice Small, MD Admit Date: 12/24/2020  Reason for Consultation/Follow-up: Establishing goals of care, "Goals of care, MOST form"  Subjective: I spoke with daughter Lattie Haw and son-in-law [name] by phone. Confirmed plan is for SNF for rehab. They are hopeful after rehab, she can return to [facility] assisted living if possible. Discussed possibility she may need long-term skilled nursing care. Family is considering trying to apply for Medicaid to cover th expenses of long-term care.  Family is agreeable to outpatient palliative follow-up while patient is in rehab, with option to transition to hospice once she has returned to assisted living or transition to long-term skilled nursing.   I completed an electronic MOST form together with Lattie Haw and [name]. The family outlined their wishes for the following treatment decisions:  Cardiopulmonary Resuscitation: Do Not Attempt Resuscitation (DNR/No CPR)  Medical Interventions: Comfort Measures: Keep clean, warm, and dry. Use medication by any route, positioning, wound care, and other measures to relieve pain and suffering. Use oxygen, suction and manual treatment of airway obstruction as needed for comfort. Do not transfer to the hospital unless comfort needs cannot be met in current location.  Antibiotics: Determine use of limitation of antibiotics when infection occurs  IV Fluids: IV fluids for a defined trial period  Feeding Tube: Feeding tube for a defined trial period     Length of Stay: 3  Current Medications: Scheduled Meds:  . antiseptic oral rinse  15 mL  Topical BID  . aspirin EC  325 mg Oral Daily  . atorvastatin  20 mg Oral Daily  . clopidogrel  75 mg Oral Daily  . levothyroxine  100 mcg Oral QAC breakfast  . OLANZapine zydis  5 mg Oral QHS  . sertraline  100 mg Oral Daily  . sodium chloride flush  3 mL Intravenous Once    PRN Meds: acetaminophen **OR** acetaminophen, glycopyrrolate **OR** glycopyrrolate **OR** glycopyrrolate, haloperidol **OR** haloperidol **OR** haloperidol lactate, HYDROmorphone HCl, hydroxypropyl methylcellulose / hypromellose, LORazepam **OR** LORazepam **OR** LORazepam, ondansetron **OR** ondansetron (ZOFRAN) IV, senna-docusate          Vital Signs: BP (!) 125/51 (BP Location: Right  Arm)   Pulse 79   Temp 97.6 F (36.4 C) (Oral)   Resp 16   Wt 63.2 kg   SpO2 99%   BMI 24.68 kg/m  SpO2: SpO2: 99 % O2 Device: O2 Device: Room Air O2 Flow Rate:    Intake/output summary:   Intake/Output Summary (Last 24 hours) at 12/27/2020 1910 Last data filed at 12/27/2020 3235 Gross per 24 hour  Intake 20 ml  Output --  Net 20 ml   LBM: Last BM Date: 12/25/20 Baseline Weight: Weight: 63.2 kg Most recent weight: Weight: 63.2 kg       Palliative Assessment/Data: PPS 30%      Palliative Care Assessment & Plan   HPI/Patient Profile:87 y.o.femalewith past medical history of deafness in right ear, near complete blindness due to macular degeneration, hypertension, hyperlipidemia, hypothyroidism, right facial droop due to Bell's palsy, and endometrial cancer in 2014 [facility] concerns of left facial droop, slurred speech, left-sided weakness. Code stroke was called and neurology consulted.Dr. Cheral Marker spoke with family who requested no heroic measures including no IR intervention.Patient was admitted on5/3/2022with acute ischemic right MCA stroke,carotid stenosis,abnormal CT of neck.  Patient evaluated by neurology and recommended medical  admission with no aggressive interventions/thrombectomy/anticoagulation as per discussion with patient's son.  Recommendations/Plan:  Continue comfort measures  Goal is SNF for rehab, followed by return to [facility] assisted living (if possible)  Continue Zyprexa 5 mg qHS for agitation  Continue aspirin, Plavix, and synthroid for now  Outpatient palliative referral - TOC order placed  PMT will continue to follow  Goals of Care and Additional Recommendations:  Limitations on Scope of Treatment: Full Comfort Care  Code Status:  Prognosis:   < 6 months  Discharge Planning:  [facility] for rehab with Palliative care service follow-up  Care plan was discussed with case management, LCSW  Thank you for allowing the Palliative Medicine Team to assist in the care of this patient.   Total Time 25 minutes Prolonged Time Billed  no       Greater than 50%  of this time was spent counseling and coordinating care related to the above assessment and plan.  Lavena Bullion, NP  Please contact Palliative Medicine Team phone at [phone] for questions and concerns.

## 2020-12-28 LAB — SARS CORONAVIRUS 2 (TAT 6-24 HRS): SARS Coronavirus 2: NEGATIVE

## 2020-12-28 LAB — BASIC METABOLIC PANEL
Anion gap: 12 (ref 5–15)
BUN: 51 mg/dL — ABNORMAL HIGH (ref 8–23)
CO2: 20 mmol/L — ABNORMAL LOW (ref 22–32)
Calcium: 9.3 mg/dL (ref 8.9–10.3)
Chloride: 112 mmol/L — ABNORMAL HIGH (ref 98–111)
Creatinine, Ser: 1.9 mg/dL — ABNORMAL HIGH (ref 0.44–1.00)
GFR, Estimated: 24 mL/min — ABNORMAL LOW (ref >60)
Glucose, Bld: 117 mg/dL — ABNORMAL HIGH (ref 70–99)
Potassium: 4.1 mmol/L (ref 3.5–5.1)
Sodium: 144 mmol/L (ref 135–145)

## 2020-12-28 LAB — CBC
HCT: 37.7 % (ref 36.0–46.0)
Hemoglobin: 12.3 g/dL (ref 12.0–15.0)
MCH: 30.9 pg (ref 26.0–34.0)
MCHC: 32.6 g/dL (ref 30.0–36.0)
MCV: 94.7 fL (ref 80.0–100.0)
Platelets: 184 10*3/uL (ref 150–400)
RBC: 3.98 MIL/uL (ref 3.87–5.11)
RDW: 13.2 % (ref 11.5–15.5)
WBC: 8.8 10*3/uL (ref 4.0–10.5)
nRBC: 0 % (ref 0.0–0.2)

## 2020-12-28 MED ORDER — OLANZAPINE 5 MG PO TBDP
7.5000 mg | ORAL_TABLET | Freq: Every day | ORAL | Status: DC
  Administered 2020-12-28: 7.5 mg via ORAL
  Filled 2020-12-28 (×3): qty 1.5

## 2020-12-28 MED ORDER — SODIUM CHLORIDE 0.9 % IV SOLN: INTRAVENOUS | Status: DC

## 2020-12-28 NOTE — Progress Notes (Signed)
Patient is sleeping at this time, she is refusing care and would not accept any medication by mouth. Will continue to monitor and give the necessary support.

## 2020-12-28 NOTE — Progress Notes (Signed)
[facility]  PROGRESS NOTE  Jessica Haynes YSA:[id number] DOB: 12-21-1922 DOA: 12/24/2020 PCP: Maurice Small, MD   Brief HPI:   87 year old female with history of hypertension, hyperlipidemia, hypothyroidism, near complete blindness due to macular degeneration, deafness in right ear, right facial droop due to Bell's palsy, has cold seeding eyelid to help close eyelid, endometrial carcinoma, chronic bladder incontinence and cognitive dysfunction at baseline, resident of independent living facility presented to ED after family noted left-sided weakness.  Neurology was consulted.  After discussion with palliative care and neurology patient's son-in-law who is the healthcare power of attorney transition her to comfort care on 12/25/2020.  However on 5/5 POA called the palliative care team indicating that he wanted her to go to skilled nursing facility for rehab.    Subjective   Patient seen and examined, no new complaints.   Assessment/Plan:     1. Acute ischemic right MCA infarct-patient has history of Isobel palsy, she had no onset of left-sided weakness and left-sided neglect.  CT head showed new hypodensity in the right parietal white matter suggestive of indeterminate but interval white matter infarct.  CTA of the head and neck showed severe atherosclerosis of the right carotid bifurcation with severe mid occlusive narrowing of the proximal ICA.  In the motor slightly there is only faint/static opacification through the petrous carotid with complete occlusion of the ICA at the cavernous sinuses.  CT perfusion study showed 58 mL of penumbra in the posterior right MCA territory.  Perfusion findings suggestive of forementioned occlusion may be in part acute but no evidence of core infarct.  2D echo showed LVEF 60 to 65% and grade 1 diastolic dysfunction.  LDL 146, A1c 5.6.  Neurology was consulted and discussing at length patient's son-in-law who is also POA risks and benefits of mechanical  thrombectomy.  He did not want her to have invasive measures to manage her medical or surgical condition.  On 5/4 Dr. Algis Liming discussed with patient's family including no MRI given metal metallic object in right eyelid at that time they wish comfort care only.  POA wishes are to return to [facility] with additional support versus home with hospice.  However on 5/5 POA called palliative care team indicating he wanted her to go to skilled nursing facility.  Per stroke MD follow-up on 5/5, stroke is likely due to atherosclerosis, was on aspirin 81 mg daily prior to admission, now recommended aspirin 81 mg daily and Plavix 75 mg daily for 3 weeks followed by aspirin alone. 2. Carotid stenosis-no aggressive interventions as above. 3. Hypertension-Home medication including lisinopril and HCTZ are on hold for permissive hypertension due to stroke.  Gradually normalize blood pressure in 140-160 range in next 3 to 5 days. 4. Abnormal CT of the neck-mucous plugging of the enlarged bronchus in the left upper lobe and distal tree-in-bud opacification suggesting underlying bronchial obstruction.  This can be followed up as outpatient as deemed necessary.  Patient is not hypoxemic, not candidate for aggressive management due to advanced age and frail status. 5. Macular degeneration-cognitive decline, hallucinations-multifactorial due to advanced age, frailty physical health, complicated by acute illness as above as well as dementia.   Scheduled medications:   . antiseptic oral rinse  15 mL Topical BID  . aspirin EC  325 mg Oral Daily  . atorvastatin  20 mg Oral Daily  . clopidogrel  75 mg Oral Daily  . levothyroxine  100 mcg Oral QAC breakfast  . OLANZapine zydis  5 mg Oral  QHS  . sertraline  100 mg Oral Daily  . sodium chloride flush  3 mL Intravenous Once         Data Reviewed:   CBG:  Recent Labs  Lab 12/24/20 1124  GLUCAP 113*    SpO2: 100 %    Vitals:   12/25/20 1146 12/26/20 0850  12/28/20 0618 12/28/20 1134  BP: (!) 186/64 (!) 125/51 132/60 (!) 162/63  Pulse: 91 79 (!) 105 97  Resp: 16 16 18    Temp: 98.5 F (36.9 C) 97.6 F (36.4 C) 97.7 F (36.5 C)   TempSrc: Axillary Oral    SpO2: 99% 99% 96% 100%  Weight:        No intake or output data in the 24 hours ending 12/28/20 1500  05/05 1901 - 05/07 0700 In: 20 [P.O.:20] Out: -   Filed Weights   12/24/20 1100  Weight: 63.2 kg    CBC:  Recent Labs  Lab 12/24/20 1128 12/28/20 1009  WBC 8.6 8.8  HGB 12.1  11.2* 12.3  HCT 36.8  33.0* 37.7  PLT 177 184  MCV 93.9 94.7  MCH 30.9 30.9  MCHC 32.9 32.6  RDW 13.3 13.2  LYMPHSABS 1.1  --   MONOABS 0.6  --   EOSABS 0.0  --   BASOSABS 0.0  --     Complete metabolic panel:  Recent Labs  Lab 12/24/20 1128 12/25/20 0411 12/28/20 1009  NA 142  143  --  144  K 4.3  4.4  --  4.1  CL 110  110  --  112*  CO2 25  --  20*  GLUCOSE 122*  117*  --  117*  BUN 22  30*  --  51*  CREATININE 1.66*  1.80*  --  1.90*  CALCIUM 9.4  --  9.3  AST 24  --   --   ALT 11  --   --   ALKPHOS 48  --   --   BILITOT 0.9  --   --   ALBUMIN 3.8  --   --   INR 1.0  --   --   HGBA1C  --  5.6  --     No results for input(s): LIPASE, AMYLASE in the last 168 hours.  Recent Labs  Lab 12/24/20 1240 12/27/20 2317  SARSCOV2NAA NEGATIVE NEGATIVE    ------------------------------------------------------------------------------------------------------------------ No results for input(s): CHOL, HDL, LDLCALC, TRIG, CHOLHDL, LDLDIRECT in the last 72 hours.  Lab Results  Component Value Date   HGBA1C 5.6 12/25/2020   ------------------------------------------------------------------------------------------------------------------ No results for input(s): TSH, T4TOTAL, T3FREE, THYROIDAB in the last 72 hours.  Invalid input(s): FREET3 ------------------------------------------------------------------------------------------------------------------ No results for  input(s): VITAMINB12, FOLATE, FERRITIN, TIBC, IRON, RETICCTPCT in the last 72 hours.  Coagulation profile Recent Labs  Lab 12/24/20 1128  INR 1.0   No results for input(s): DDIMER in the last 72 hours.  Cardiac Enzymes No results for input(s): CKTOTAL, CKMB, CKMBINDEX, TROPONINI in the last 168 hours.  ------------------------------------------------------------------------------------------------------------------ No results found for: BNP   Antibiotics: Anti-infectives (From admission, onward)   Start     Dose/Rate Route Frequency Ordered Stop   12/24/20 2200  doxycycline (VIBRA-TABS) tablet 100 mg  Status:  Discontinued        100 mg Oral Every 12 hours 12/24/20 1613 12/25/20 1732       Radiology Reports  No results found.    DVT prophylaxis: SCDs  Code Status: Full code  Family Communication: No family at bedside  Consultants:  Neurology  Palliative care  Procedures:      Objective    Physical Examination:   General-appears in no acute distress Heart-S1-S2, regular, no murmur auscultated Lungs-clear to auscultation bilaterally, no wheezing or crackles auscultated Abdomen-soft, nontender, no organomegaly Extremities-no edema in the lower extremities Neuro-somnolent, not following commands  Status is: Inpatient  Dispo: The patient is from: Home              Anticipated d/c is to: Skilled nursing facility              Anticipated d/c date is: 12/30/2020              Patient currently not stable for discharge  Barrier to discharge-none  COVID-19 Labs  No results for input(s): DDIMER, FERRITIN, LDH, CRP in the last 72 hours.  Lab Results  Component Value Date   [place] NEGATIVE 12/27/2020   [place] NEGATIVE 12/24/2020    Microbiology  Recent Results (from the past 240 hour(s))  SARS CORONAVIRUS 2 (TAT 6-24 HRS) Nasopharyngeal Nasopharyngeal Swab     Status: None   Collection Time: 12/24/20 12:40 PM   Specimen:  Nasopharyngeal Swab  Result Value Ref Range Status   SARS Coronavirus 2 NEGATIVE NEGATIVE Final    Comment: (NOTE) SARS-CoV-2 target nucleic acids are NOT DETECTED.  The SARS-CoV-2 RNA is generally detectable in upper and lower respiratory specimens during the acute phase of infection. Negative results do not preclude SARS-CoV-2 infection, do not rule out co-infections with other pathogens, and should not be used as the sole basis for treatment or other patient management decisions. Negative results must be combined with clinical observations, patient history, and epidemiological information. The expected result is Negative.  Fact Sheet for Patients: SugarRoll.be  Fact Sheet for Healthcare Providers: https://www.woods-mathews.com/  This test is not yet approved or cleared by the [place] FDA and  has been authorized for detection and/or diagnosis of SARS-CoV-2 by FDA under an Emergency Use Authorization (EUA). This EUA will remain  in effect (meaning this test can be used) for the duration of the COVID-19 declaration under Se ction 564(b)(1) of the Act, 21 U.S.C. section 360bbb-3(b)(1), unless the authorization is terminated or revoked sooner.  Performed at [facility], [place] [address] [place], [place] [address]   SARS CORONAVIRUS 2 (TAT 6-24 HRS) Nasopharyngeal Nasopharyngeal Swab     Status: None   Collection Time: 12/27/20 11:17 PM   Specimen: Nasopharyngeal Swab  Result Value Ref Range Status   SARS Coronavirus 2 NEGATIVE NEGATIVE Final    Comment: (NOTE) SARS-CoV-2 target nucleic acids are NOT DETECTED.  The SARS-CoV-2 RNA is generally detectable in upper and lower respiratory specimens during the acute phase of infection. Negative results do not preclude SARS-CoV-2 infection, do not rule out co-infections with other pathogens, and should not be used as the sole basis for treatment or other patient management  decisions. Negative results must be combined with clinical observations, patient history, and epidemiological information. The expected result is Negative.  Fact Sheet for Patients: SugarRoll.be  Fact Sheet for Healthcare Providers: https://www.woods-mathews.com/  This test is not yet approved or cleared by the [place] FDA and  has been authorized for detection and/or diagnosis of SARS-CoV-2 by FDA under an Emergency Use Authorization (EUA). This EUA will remain  in effect (meaning this test can be used) for the duration of the COVID-19 declaration under Se ction 564(b)(1) of the Act, 21 U.S.C. section 360bbb-3(b)(1), unless the authorization is terminated  or revoked sooner.  Performed at [facility], [place] [address] [place], [place] [address]              [facility] If 7PM-7AM, please contact night-coverage at [email or web address], Office  [phone]   12/28/2020, 3:00 PM  LOS: 4 days

## 2020-12-28 NOTE — TOC Progression Note (Signed)
Transition of Care [facility]) - Progression Note    Patient Details  Name: LAIYA WISBY MRN: [id number] Date of Birth: [birth year]  Transition of Care [facility]) CM/SW [place], [place] Phone Number: 12/28/2020, 1:47 PM  Clinical Narrative:    CSW was updated by Health teams that at this time they do not think pt meets the Criteria for SNF placement. They have offered a peer to peer, this info was given to the MD, to be completed by 5 pm. CSW will continue to follow for needs.   Expected Discharge Plan: [place] Barriers to Discharge: Continued Medical Work up  Expected Discharge Plan and Services Expected Discharge Plan: [place]   Discharge Planning Services: CM Consult Post Acute Care Choice: [place] Living arrangements for the past 2 months: [place]                                       Social Determinants of Health (SDOH) Interventions    Readmission Risk Interventions No flowsheet data found.

## 2020-12-28 NOTE — Plan of Care (Signed)
  Problem: Education: Goal: Knowledge of General Education information will improve Description: Including pain rating scale, medication(s)/side effects and non-pharmacologic comfort measures Outcome: Not Progressing   Problem: Nutrition: Goal: Adequate nutrition will be maintained Outcome: Not Progressing   

## 2020-12-28 NOTE — Progress Notes (Signed)
Daily Progress Note   Patient Name: Jessica Haynes       Date: 12/28/2020 DOB: 10/17/1922  Age: 87 y.o. MRN#: [id number] Attending Physician: Meredeth Ide, MD Primary Care Physician: Shirlean Mylar, MD Admit Date: 12/24/2020  Reason for Consultation/Follow-up: Disposition, Establishing goals of care, Non pain symptom management, Pain control, Psychosocial/spiritual support and Terminal Care  Subjective: Chart review performed. Recieved report from primary RN - no acute concerns. RN states patient is withdrawn, confused, and not accepting of food/liquid or medications.  RN tells me IV fluids were started today around lunchtime.  Went to visit patient at bedside - no family/visitors present. Patient is lying in bed -she is awake, disoriented, able to participate in minimal conversation, and unable to make complex medical decisions.  She is able to tell me her first name, not her last name, and continually states " let me leave."  Patient reports she is uncomfortable but unable to tell me any further specific details.   Requested RN provide pain medication and Ativan for complaints of discomfort and patient's anxiety.  2:45 PM Attempted to call Sherrine Maples to provide updates and discuss GOC, disposition, and options - no answer - confidential voicemail left and PMT phone number provided with request to return call.  Sherrine Maples returned phone call.  Spoke with Sherrine Maples and his wife/patient's daughter/[name] via speaker phone - provided updates and continued GOC discussion.  Sherrine Maples confirms that he does not feel rehab is the best option for patient however pending finances was what they were considering.  Reviewed that now with patient's continual decline and poor p.o. intake she may qualify for residential hospice.   Sherrine Maples confirms he remembers previous hospice discussion including residential hospice is for patients with a prognosis of less than 2 weeks - he is agreeable for residential hospice referral, requesting [facility].  We reviewed that if patient is not accepted at [facility], could consider referral to [facility] as sometimes they are accepting of patient's with prognosis 6 weeks or less. - can continue this discussion if needed pending [facility] decision.  Discussed nonemergent transport per family's request.  We discussed patient's refusal of medications -specifically discussed stopping aspirin, Plavix, Synthroid, IV fluids.  We discussed risk/benefit of continuing/stopping these medications in context of end-of-life.  Sherrine Maples is agreeable to discontinue all  medications listed above as he knows patient would not want her life prolonged in current state and wants to decrease pill burden.  [name] [name] request to update MOST form so patient will not receive IV fluids in the future.  MOST form updated electronically.  All questions and concerns addressed. Encouraged to call with questions and/or concerns. PMT number provided.   Length of Stay: 4  Current Medications: Scheduled Meds:  . antiseptic oral rinse  15 mL Topical BID  . aspirin EC  325 mg Oral Daily  . atorvastatin  20 mg Oral Daily  . clopidogrel  75 mg Oral Daily  . levothyroxine  100 mcg Oral QAC breakfast  . OLANZapine zydis  5 mg Oral QHS  . sertraline  100 mg Oral Daily  . sodium chloride flush  3 mL Intravenous Once    Continuous Infusions: . sodium chloride 75 mL/hr at 12/28/20 1117    PRN Meds: acetaminophen **OR** acetaminophen, glycopyrrolate **OR** glycopyrrolate **OR** glycopyrrolate, haloperidol **OR** haloperidol **OR** haloperidol lactate, HYDROmorphone HCl, hydroxypropyl methylcellulose / hypromellose, LORazepam **OR** LORazepam **OR** LORazepam, ondansetron **OR** ondansetron (ZOFRAN) IV,  senna-docusate  Physical Exam Vitals and nursing note reviewed.  Constitutional:      General: She is not in acute distress.    Appearance: She is cachectic. She is ill-appearing.  Pulmonary:     Effort: No respiratory distress.  Skin:    General: Skin is warm and dry.  Neurological:     Mental Status: She is alert. She is disoriented and confused.     Motor: Weakness present.  Psychiatric:        Attention and Perception: She is inattentive.        Mood and Affect: Mood is anxious.        Behavior: Behavior is withdrawn.        Cognition and Memory: Cognition is impaired. Memory is impaired.             Vital Signs: BP (!) 162/63   Pulse 97   Temp 97.7 F (36.5 C)   Resp 18   Wt 63.2 kg   SpO2 100%   BMI 24.68 kg/m  SpO2: SpO2: 100 % O2 Device: O2 Device: Room Air O2 Flow Rate:    Intake/output summary: No intake or output data in the 24 hours ending 12/28/20 1445 LBM: Last BM Date: 12/25/20 Baseline Weight: Weight: 63.2 kg Most recent weight: Weight: 63.2 kg       Palliative Assessment/Data: PPS 10%    Flowsheet Rows   Flowsheet Row Most Recent Value  Intake Tab   Referral Department Hospitalist  Unit at Time of Referral ER  Palliative Care Primary Diagnosis Neurology  Date Notified 12/24/20  Palliative Care Type New Palliative care  Reason for referral Clarify Goals of Care, Advance Care Planning  Date of Admission 12/24/20  Date first seen by Palliative Care 12/24/20  [per MD, goals are clear - asks PMT to see patient/family tomorrow]  # of days Palliative referral response time 0 Day(s)  # of days IP prior to Palliative referral 0  Clinical Assessment   Psychosocial & Spiritual Assessment   Palliative Care Outcomes   Patient/Family meeting held? Yes  Who was at the meeting? son-in-law  Palliative Care Outcomes Improved pain interventions, Improved non-pain symptom therapy, Clarified goals of care, Counseled regarding hospice, Provided end of life  care assistance, Provided psychosocial or spiritual support, Changed to focus on comfort, Completed durable DNR, Transitioned to hospice  Patient/Family wishes:  Interventions discontinued/not started  Mechanical Ventilation, Antibiotics, Tube feedings/TPN, BiPAP, Hemodialysis, NIPPV, Trach, Transfusion, Vasopressors, PEG, Transfer out of ICU      Patient Active Problem List   Diagnosis Date Noted  . TIA (transient ischemic attack) 12/24/2020  . CVA (cerebral vascular accident) ([place]) 12/24/2020  . Abdominal pain, left lower quadrant 12/09/2017  . Diarrhea 04/06/2014  . Deafness in right ear   . Endometrial cancer ([place]) 03/01/2013  . Macular degeneration 06/08/2011  . Bell's palsy 11/25/2009  . VITAMIN B12 DEFICIENCY 11/10/2007  . MENINGIOMA 05/13/2007  . Hypothyroidism 05/13/2007  . HYPERLIPIDEMIA 05/13/2007  . Essential hypertension 05/13/2007  . Other specified disorders of bladder 05/13/2007    Palliative Care Assessment & Plan   Patient Profile: 87 y.o.femalewith past medical history of deafness in right ear, near complete blindness due to macular degeneration, hypertension, hyperlipidemia, hypothyroidism, right facial droop due to Bell's palsy, and endometrial cancer in 2014 [facility] [place] concerns of left facial droop, slurred speech, left-sided weakness. Code stroke was called and neurology consulted.Dr. Cheral Marker spoke with family who requested no heroic measures including no IR intervention.Patient was admitted on5/3/2022with acute ischemic right MCA stroke,carotid stenosis,abnormal CT of neck.  Patient evaluated by neurology and recommended medical admission with no aggressive interventions/thrombectomy/anticoagulation as per discussion with patient's son.  Assessment: Acute ischemic right MCA infarct Carotid stenosis Hypertension Abnormal CT of the neck Macular degeneration Terminal  care   Recommendations/Plan:  Continue full comfort measures  Continue DNR/DNI as previously documented  Due to patient's quick and continual decline (now refusing care, medications, and p.o. intake) family are agreeable for residential hospice referral, requesting [facility] liaison notified; TOC consult placed  If not accepted at [facility] can consider [place] referral as they will sometimes accept patient's with prognosis of <6 weeks - continue discussions with family pending [facility] decision  Increased Zyprexa disintegrating tablet to 7.5 mg nightly to try and better manage patient's anxiety/agitation  Discontinued aspirin, Plavix, Synthroid, IV fluids  MOST form updated as follows: DNR, Comfort Measures (DO NOT INTUBATE), Determine use or limitation of antibiotics when infection occurs, No IV fluids, No feeding tube. Electronic copy is in Vynca/ACP tab  PMT will continue to follow and support holistically  Goals of Care and Additional Recommendations:  Limitations on Scope of Treatment: Full Comfort Care  Code Status:    Code Status Orders  (From admission, onward)         Start     Ordered   12/25/20 1249  Do not attempt resuscitation (DNR)  Continuous       Question Answer Comment  In the event of cardiac or respiratory ARREST Do not call a "code blue"   In the event of cardiac or respiratory ARREST Do not perform Intubation, CPR, defibrillation or ACLS   In the event of cardiac or respiratory ARREST Use medication by any route, position, wound care, and other measures to relive pain and suffering. May use oxygen, suction and manual treatment of airway obstruction as needed for comfort.      12/25/20 1255        Code Status History    Date Active Date Inactive Code Status Order ID Comments User Context   12/24/2020 1445 12/25/2020 1255 DNR [id number]  [place] Shi, MD ED   Advance Care Planning Activity        Prognosis:   < 2 weeks   Discharge Planning:  To Be Determined  Care plan was discussed with primary RN, patient's family, Dr. Darrick Meigs, [facility] liaison  Thank you for allowing the Palliative Medicine Team to assist in the care of this patient.   Total Time 45 minutes Prolonged Time Billed  no       Greater than 50%  of this time was spent counseling and coordinating care related to the above assessment and plan.  Lin Landsman, NP  Please contact Palliative Medicine Team phone at [phone] for questions and concerns.   *Portions of this note are a verbal dictation therefore any spelling and/or grammatical errors are due to the "[place] [employer]" system interpretation.

## 2020-12-28 NOTE — Progress Notes (Signed)
Manufacturing engineer [facility]) Hospital Liaison note    Received request from [place] for family interest in [facility]. Chart and pt information under review by [facility] physician.  Hospice eligibility pending at this time.  Visited pt at bedside. Pt noted to be agitated, asking to leave, uninterested in offered beverage.  Unclear at this time if pt would be appropriate for [facility].  Liaison will reassess tomorrow and have MD review chart, as [facility] is unable to offer a room today.   Please do not hesitate to call with questions. Thank you for the opportunity to participate in this patient's care.  Domenic Moras, BSN, RN [facility] Liaison (listed on [place] under Hospice[facility])    [phone] (24h on call)

## 2020-12-29 NOTE — Progress Notes (Signed)
[facility]  PROGRESS NOTE  Jessica Haynes YPP:[id number] DOB: Nov 05, 1922 DOA: 12/24/2020 PCP: Maurice Small, MD   Brief HPI:   87 year old female with history of hypertension, hyperlipidemia, hypothyroidism, near complete blindness due to macular degeneration, deafness in right ear, right facial droop due to Bell's palsy, has cold seeding eyelid to help close eyelid, endometrial carcinoma, chronic bladder incontinence and cognitive dysfunction at baseline, resident of independent living facility presented to ED after family noted left-sided weakness.  Neurology was consulted.  After discussion with palliative care and neurology patient's son-in-law who is the healthcare power of attorney transition her to comfort care on 12/25/2020.  However on 5/5 POA called the palliative care team indicating that he wanted her to go to skilled nursing facility for rehab.    Subjective   Patient seen and examined, continues to be lethargic.  Family pursuing options for residential hospice.   Assessment/Plan:     1. Acute ischemic right MCA infarct-patient has history of Isobel palsy, she had no onset of left-sided weakness and left-sided neglect.  CT head showed new hypodensity in the right parietal white matter suggestive of indeterminate but interval white matter infarct.  CTA of the head and neck showed severe atherosclerosis of the right carotid bifurcation with severe mid occlusive narrowing of the proximal ICA.  In the motor slightly there is only faint/static opacification through the petrous carotid with complete occlusion of the ICA at the cavernous sinuses.  CT perfusion study showed 58 mL of penumbra in the posterior right MCA territory.  Perfusion findings suggestive of forementioned occlusion may be in part acute but no evidence of core infarct.  2D echo showed LVEF 60 to 65% and grade 1 diastolic dysfunction.  LDL 146, A1c 5.6.  Neurology was consulted and discussing at length patient's  son-in-law who is also POA risks and benefits of mechanical thrombectomy.  He did not want her to have invasive measures to manage her medical or surgical condition.  On 5/4 Dr. Algis Liming discussed with patient's family including no MRI given metal metallic object in right eyelid at that time they wish comfort care only.  POA wishes are to return to [facility] with additional support versus home with hospice.  However on 5/5 POA called palliative care team indicating he wanted her to go to skilled nursing facility.  Per stroke MD follow-up on 5/5, stroke is likely due to atherosclerosis, was on aspirin 81 mg daily prior to admission, now recommended aspirin 81 mg daily and Plavix 75 mg daily for 3 weeks followed by aspirin alone.  Patient is now full comfort care only.  Not taking p.o. medications.  2. Carotid stenosis-no aggressive interventions as above.  3. Hypertension-Home medication including lisinopril and HCTZ are on hold for permissive hypertension due to stroke.  Patient not taking p.o. medications, full comfort measures only.  4. Abnormal CT of the neck-mucous plugging of the enlarged bronchus in the left upper lobe and distal tree-in-bud opacification suggesting underlying bronchial obstruction.  This can be followed up as outpatient as deemed necessary.  Patient is not hypoxemic, not candidate for aggressive management due to advanced age and frail status.  Patient on full comfort measures only.  5. Macular degeneration-cognitive decline, hallucinations-multifactorial due to advanced age, frailty physical health, complicated by acute illness as above as well as dementia.  6. Hypersomnolence/poor p.o. intake/failure to thrive-patient has been refusing her medications, she has poor p.o. intake.  Family is pursuing option for residential hospice.   Scheduled medications:   .  antiseptic oral rinse  15 mL Topical BID  . atorvastatin  20 mg Oral Daily  . OLANZapine zydis  7.5 mg Oral QHS   . sertraline  100 mg Oral Daily  . sodium chloride flush  3 mL Intravenous Once         Data Reviewed:   CBG:  Recent Labs  Lab 12/24/20 1124  GLUCAP 113*    SpO2: 98 %    Vitals:   12/28/20 1605 12/28/20 1932 12/29/20 0800 12/29/20 1200  BP: (!) 142/59 (!) 185/62    Pulse: (!) 105 99    Resp:  18 (!) 26 (!) 24  Temp: 98.3 F (36.8 C) 97.8 F (36.6 C)    TempSrc: Oral Oral    SpO2: 99% 98%    Weight:         Intake/Output Summary (Last 24 hours) at 12/29/2020 1357 Last data filed at 12/28/2020 1639 Gross per 24 hour  Intake 248.77 ml  Output --  Net 248.77 ml    05/06 1901 - 05/08 0700 In: 248.8 [I.V.:248.8] Out: -   Filed Weights   12/24/20 1100  Weight: 63.2 kg    CBC:  Recent Labs  Lab 12/24/20 1128 12/28/20 1009  WBC 8.6 8.8  HGB 12.1  11.2* 12.3  HCT 36.8  33.0* 37.7  PLT 177 184  MCV 93.9 94.7  MCH 30.9 30.9  MCHC 32.9 32.6  RDW 13.3 13.2  LYMPHSABS 1.1  --   MONOABS 0.6  --   EOSABS 0.0  --   BASOSABS 0.0  --     Complete metabolic panel:  Recent Labs  Lab 12/24/20 1128 12/25/20 0411 12/28/20 1009  NA 142  143  --  144  K 4.3  4.4  --  4.1  CL 110  110  --  112*  CO2 25  --  20*  GLUCOSE 122*  117*  --  117*  BUN 22  30*  --  51*  CREATININE 1.66*  1.80*  --  1.90*  CALCIUM 9.4  --  9.3  AST 24  --   --   ALT 11  --   --   ALKPHOS 48  --   --   BILITOT 0.9  --   --   ALBUMIN 3.8  --   --   INR 1.0  --   --   HGBA1C  --  5.6  --     No results for input(s): LIPASE, AMYLASE in the last 168 hours.  Recent Labs  Lab 12/24/20 1240 12/27/20 2317  SARSCOV2NAA NEGATIVE NEGATIVE    ------------------------------------------------------------------------------------------------------------------ No results for input(s): CHOL, HDL, LDLCALC, TRIG, CHOLHDL, LDLDIRECT in the last 72 hours.  Lab Results  Component Value Date   HGBA1C 5.6 12/25/2020    ------------------------------------------------------------------------------------------------------------------ No results for input(s): TSH, T4TOTAL, T3FREE, THYROIDAB in the last 72 hours.  Invalid input(s): FREET3 ------------------------------------------------------------------------------------------------------------------ No results for input(s): VITAMINB12, FOLATE, FERRITIN, TIBC, IRON, RETICCTPCT in the last 72 hours.  Coagulation profile Recent Labs  Lab 12/24/20 1128  INR 1.0   No results for input(s): DDIMER in the last 72 hours.  Cardiac Enzymes No results for input(s): CKTOTAL, CKMB, CKMBINDEX, TROPONINI in the last 168 hours.  ------------------------------------------------------------------------------------------------------------------ No results found for: BNP   Antibiotics: Anti-infectives (From admission, onward)   Start     Dose/Rate Route Frequency Ordered Stop   12/24/20 2200  doxycycline (VIBRA-TABS) tablet 100 mg  Status:  Discontinued  100 mg Oral Every 12 hours 12/24/20 1613 12/25/20 1732       Radiology Reports  No results found.    DVT prophylaxis: SCDs  Code Status: Full code  Family Communication: No family at bedside   Consultants:  Neurology  Palliative care  Procedures:      Objective    Physical Examination:   General-appears in no acute distress Heart-S1-S2, regular, no murmur auscultated Lungs-clear to auscultation bilaterally, no wheezing or crackles auscultated Abdomen-soft, nontender, no organomegaly Extremities-no edema in the lower extremities Neuro-somnolent, not following commands  Status is: Inpatient  Dispo: The patient is from: Home              Anticipated d/c is to: Residential hospice              Anticipated d/c date is: 12/30/2020              Patient currently stable for discharge to residential hospice  Barrier to discharge-awaiting disposition to residential  hospice  COVID-19 Labs  No results for input(s): DDIMER, FERRITIN, LDH, CRP in the last 72 hours.  Lab Results  Component Value Date   [place] NEGATIVE 12/27/2020   [place] NEGATIVE 12/24/2020    Microbiology  Recent Results (from the past 240 hour(s))  SARS CORONAVIRUS 2 (TAT 6-24 HRS) Nasopharyngeal Nasopharyngeal Swab     Status: None   Collection Time: 12/24/20 12:40 PM   Specimen: Nasopharyngeal Swab  Result Value Ref Range Status   SARS Coronavirus 2 NEGATIVE NEGATIVE Final    Comment: (NOTE) SARS-CoV-2 target nucleic acids are NOT DETECTED.  The SARS-CoV-2 RNA is generally detectable in upper and lower respiratory specimens during the acute phase of infection. Negative results do not preclude SARS-CoV-2 infection, do not rule out co-infections with other pathogens, and should not be used as the sole basis for treatment or other patient management decisions. Negative results must be combined with clinical observations, patient history, and epidemiological information. The expected result is Negative.  Fact Sheet for Patients: SugarRoll.be  Fact Sheet for Healthcare Providers: https://www.woods-mathews.com/  This test is not yet approved or cleared by the [place] FDA and  has been authorized for detection and/or diagnosis of SARS-CoV-2 by FDA under an Emergency Use Authorization (EUA). This EUA will remain  in effect (meaning this test can be used) for the duration of the COVID-19 declaration under Se ction 564(b)(1) of the Act, 21 U.S.C. section 360bbb-3(b)(1), unless the authorization is terminated or revoked sooner.  Performed at [facility], [place] [address], [place], [place] [address]   SARS CORONAVIRUS 2 (TAT 6-24 HRS) Nasopharyngeal Nasopharyngeal Swab     Status: None   Collection Time: 12/27/20 11:17 PM   Specimen: Nasopharyngeal Swab  Result Value Ref Range Status   SARS Coronavirus 2  NEGATIVE NEGATIVE Final    Comment: (NOTE) SARS-CoV-2 target nucleic acids are NOT DETECTED.  The SARS-CoV-2 RNA is generally detectable in upper and lower respiratory specimens during the acute phase of infection. Negative results do not preclude SARS-CoV-2 infection, do not rule out co-infections with other pathogens, and should not be used as the sole basis for treatment or other patient management decisions. Negative results must be combined with clinical observations, patient history, and epidemiological information. The expected result is Negative.  Fact Sheet for Patients: SugarRoll.be  Fact Sheet for Healthcare Providers: https://www.woods-mathews.com/  This test is not yet approved or cleared by the [place] FDA and  has been authorized for detection and/or  diagnosis of SARS-CoV-2 by FDA under an Emergency Use Authorization (EUA). This EUA will remain  in effect (meaning this test can be used) for the duration of the COVID-19 declaration under Se ction 564(b)(1) of the Act, 21 U.S.C. section 360bbb-3(b)(1), unless the authorization is terminated or revoked sooner.  Performed at [facility], [place] [address] [place], [place] [address]              [facility] If 7PM-7AM, please contact night-coverage at [email or web address], Office  [phone]   12/29/2020, 1:57 PM  LOS: 5 days

## 2020-12-29 NOTE — Progress Notes (Signed)
Daily Progress Note   Patient Name: Jessica Haynes       Date: 12/29/2020 DOB: 19-Jan-1923  Age: 87 y.o. MRN#: [id number] Attending Physician: Oswald Hillock, MD Primary Care Physician: Maurice Small, MD Admit Date: 12/24/2020  Reason for Consultation/Follow-up:   Subjective: Chart reviewed. Received update from primary RN - patient has further declined overnight. She is non-responsive, and having periods of apnea.   Patient appears comfortable. Unresponsive to voice and light touch. No non-verbal signs of pain or discomfort noted. Respirations are even and unlabored. No excessive respiratory secretions noted.   17:21--Spoke with son-in-law [name] by phone. He reports visiting for several hours earlier today and is aware of patient's decline. Education and counseling provided on expectations at EOL. Emotional support provided. Discussed prognosis is likely hours-days. Discussed that we could consider transfer to [facility] tomorrow if a bed is available and if she is stable for transfer.   Length of Stay: 5  Current Medications: Scheduled Meds:  . antiseptic oral rinse  15 mL Topical BID  . atorvastatin  20 mg Oral Daily  . OLANZapine zydis  7.5 mg Oral QHS  . sertraline  100 mg Oral Daily  . sodium chloride flush  3 mL Intravenous Once      PRN Meds: acetaminophen **OR** acetaminophen, glycopyrrolate **OR** glycopyrrolate **OR** glycopyrrolate, haloperidol **OR** haloperidol **OR** haloperidol lactate, HYDROmorphone HCl, hydroxypropyl methylcellulose / hypromellose, LORazepam **OR** LORazepam **OR** LORazepam, ondansetron **OR** ondansetron (ZOFRAN) IV, senna-docusate     Vital Signs: BP (!) 185/62 (BP Location: Left Arm)   Pulse 99   Temp 97.8 F (36.6 C) (Oral)   Resp (!) 24    Wt 63.2 kg   SpO2 98%   BMI 24.68 kg/m  SpO2: SpO2: 98 % O2 Device: O2 Device: Room Air O2 Flow Rate:    Intake/output summary:   Intake/Output Summary (Last 24 hours) at 12/29/2020 1348 Last data filed at 12/28/2020 1639 Gross per 24 hour  Intake 248.77 ml  Output --  Net 248.77 ml   LBM: Last BM Date: 12/25/20 Baseline Weight: Weight: 63.2 kg Most recent weight: Weight: 63.2 kg       Palliative Assessment/Data: PPS 10%     Patient Active Problem List   Diagnosis Date Noted  . TIA (transient ischemic attack) 12/24/2020  . CVA (cerebral  vascular accident) ([place]) 12/24/2020  . Abdominal pain, left lower quadrant 12/09/2017  . Diarrhea 04/06/2014  . Deafness in right ear   . Endometrial cancer ([place]) 03/01/2013  . Macular degeneration 06/08/2011  . Bell's palsy 11/25/2009  . VITAMIN B12 DEFICIENCY 11/10/2007  . MENINGIOMA 05/13/2007  . Hypothyroidism 05/13/2007  . HYPERLIPIDEMIA 05/13/2007  . Essential hypertension 05/13/2007  . Other specified disorders of bladder 05/13/2007    Palliative Care Assessment & Plan   Patient Profile: 87 y.o.femalewith past medical history of deafness in right ear, near complete blindness due to macular degeneration, hypertension, hyperlipidemia, hypothyroidism, right facial droop due to Bell's palsy, and endometrial cancer in 2014 [facility] [place] concerns of left facial droop, slurred speech, left-sided weakness. Code stroke was called and neurology consulted.Dr. Cheral Marker spoke with family who requested no heroic measures including no IR intervention.Patient was admitted on5/3/2022with acute ischemic right MCA stroke,carotid stenosis,abnormal CT of neck.  Patient evaluated by neurology and recommended medical admission with no aggressive interventions/thrombectomy/anticoagulation as per discussion with patient's son.  Assessment: Acute ischemic right MCA infarct Carotid  stenosis Hypertension Abnormal CT of the neck Macular degeneration Terminal care  Recommendations/Plan:  Continue full comfort measures   DNR/DNI as previously documented  Transfer to [facility] when bed becomes available and eligibility confirmed  Provide frequent assessments and administer PRN medications as clinically necessary to ensure EOL comfort  PMT will continue to follow holistically   Goals of Care and Additional Recommendations:  Limitations on Scope of Treatment: full comfort care  Code Status:  DNR/DNI  Prognosis:   hours-days  Discharge Planning:  Residential hospice versus anticipated hospital death  Care plan was discussed with bedside RN  Thank you for allowing the Palliative Medicine Team to assist in the care of this patient.   Total Time 15 minutes Prolonged Time Billed no      Greater than 50%  of this time was spent counseling and coordinating care related to the above assessment and plan.  Lavena Bullion, NP  Please contact Palliative Medicine Team phone at [phone] for questions and concerns.

## 2020-12-30 DIAGNOSIS — I63231 Cerebral infarction due to unspecified occlusion or stenosis of right carotid arteries: Secondary | ICD-10-CM

## 2020-12-30 LAB — SARS CORONAVIRUS 2 (TAT 6-24 HRS): SARS Coronavirus 2: NEGATIVE

## 2020-12-30 MED ORDER — LORAZEPAM 2 MG/ML PO CONC: 1.0000 mg | Freq: Three times a day (TID) | ORAL | Status: AC | PRN

## 2020-12-30 MED ORDER — OLANZAPINE 5 MG PO TBDP
7.5000 mg | ORAL_TABLET | Freq: Every day | ORAL | Status: AC
Start: 2020-12-30 — End: ?

## 2020-12-30 MED ORDER — HYPROMELLOSE (GONIOSCOPIC) 2.5 % OP SOLN: 1.0000 [drp] | Freq: Three times a day (TID) | OPHTHALMIC | Status: AC | PRN

## 2020-12-30 MED ORDER — HYDROMORPHONE HCL 1 MG/ML PO LIQD: 0.2500 mg | Freq: Four times a day (QID) | ORAL | Status: AC | PRN

## 2020-12-30 NOTE — Progress Notes (Signed)
Daily Progress Note   Patient Name: Jessica Haynes       Date: 12/30/2020 DOB: [birth year]  Age: 87 y.o. MRN#: [id number] Attending Physician: Modena Jansky, MD Primary Care Physician: Maurice Small, MD Admit Date: 12/24/2020  Reason for Consultation/Follow-up: Disposition, Establishing goals of care, Non pain symptom management, Pain control, Psychosocial/spiritual support and Terminal Care  Subjective: Received call from son-in-law/Glenn -he states he had missed a call from a [facility] representative but did not have a contact number to follow-up.  Chart review performed.  Reviewed most recent [place] note with [name] from [facility] that unfortunately [facility] was not able to offer a bed today.  Education provided that even if patient is accepted by [facility] (evaluation still is pending) that we would not be able to transfer until bed is available.  [name] asks if there are other hospice facilities that may be able to offer a bed -reviewed options.  [name] is agreeable for referral to be sent to [facility] facility as this is also within reasonable driving distance of his home.  He is okay with patient discharging to [facility] or [place] facility -which ever can offer a bed first.  [name] understands that patient's window for low risk transfer will likely be closing soon and he is hopeful that patient can be discharged to a residential hospice facility before becoming too unstable.   Discussed and notified TOC, primary RN, and Dr. Algis Liming of conversation as outlined above.  Received report from primary RN -no acute concerns.  RN states patient remains very lethargic, is not eating or drinking, not taking meds by mouth.  Went to visit patient at  bedside - she does arouse to voice/gentle touch but only with a yawn, no other meaningful interaction, she does not open her eyes. No signs or non-verbal gestures of pain or discomfort noted. No respiratory distress, increased work of breathing, or secretions noted.   Called [facility] liaison to provide update that I had spoken with son-in-law/Glenn and informed him that they did not have a bed available today.  Length of Stay: 6  Current Medications: Scheduled Meds:  . antiseptic oral rinse  15 mL Topical BID  . atorvastatin  20 mg Oral Daily  . OLANZapine zydis  7.5  mg Oral QHS  . sertraline  100 mg Oral Daily  . sodium chloride flush  3 mL Intravenous Once    Continuous Infusions:   PRN Meds: acetaminophen **OR** acetaminophen, glycopyrrolate **OR** glycopyrrolate **OR** glycopyrrolate, haloperidol **OR** haloperidol **OR** haloperidol lactate, HYDROmorphone HCl, hydroxypropyl methylcellulose / hypromellose, LORazepam **OR** LORazepam **OR** LORazepam, ondansetron **OR** ondansetron (ZOFRAN) IV, senna-docusate  Physical Exam Vitals and nursing note reviewed.  Constitutional:      General: She is not in acute distress.    Appearance: She is cachectic. She is ill-appearing.  Pulmonary:     Effort: No respiratory distress.  Skin:    General: Skin is warm and dry.  Neurological:     Mental Status: She is lethargic.     Motor: Weakness present.  Psychiatric:        Speech: She is noncommunicative.             Vital Signs: BP (!) 175/73 (BP Location: Right Arm) Comment: RN Notified  Pulse (!) 106 Comment: RN Notified  Temp 98 F (36.7 C) (Oral)   Resp 12 Comment: RN Notified  Wt 63.2 kg   SpO2 99%   BMI 24.68 kg/m  SpO2: SpO2: 99 % O2 Device: O2 Device: Room Air O2 Flow Rate:    Intake/output summary:   Intake/Output Summary (Last 24 hours) at 12/30/2020 1325 Last data filed at 12/30/2020 0900 Gross per 24 hour  Intake 0 ml  Output --  Net 0 ml   LBM: Last BM Date:  12/25/20 Baseline Weight: Weight: 63.2 kg Most recent weight: Weight: 63.2 kg       Palliative Assessment/Data: PPS 10%    Flowsheet Rows   Flowsheet Row Most Recent Value  Intake Tab   Referral Department Hospitalist  Unit at Time of Referral ER  Palliative Care Primary Diagnosis Neurology  Date Notified 12/24/20  Palliative Care Type New Palliative care  Reason for referral Clarify Goals of Care, Advance Care Planning  Date of Admission 12/24/20  Date first seen by Palliative Care 12/24/20  [per MD, goals are clear - asks PMT to see patient/family tomorrow]  # of days Palliative referral response time 0 Day(s)  # of days IP prior to Palliative referral 0  Clinical Assessment   Psychosocial & Spiritual Assessment   Palliative Care Outcomes   Patient/Family meeting held? Yes  Who was at the meeting? son-in-law  Palliative Care Outcomes Improved pain interventions, Improved non-pain symptom therapy, Clarified goals of care, Counseled regarding hospice, Provided end of life care assistance, Provided psychosocial or spiritual support, Changed to focus on comfort, Completed durable DNR, Transitioned to hospice  Patient/Family wishes: Interventions discontinued/not started  Mechanical Ventilation, Antibiotics, Tube feedings/TPN, BiPAP, Hemodialysis, NIPPV, Trach, Transfusion, Vasopressors, PEG, Transfer out of ICU      Patient Active Problem List   Diagnosis Date Noted  . TIA (transient ischemic attack) 12/24/2020  . CVA (cerebral vascular accident) ([place]) 12/24/2020  . Abdominal pain, left lower quadrant 12/09/2017  . Diarrhea 04/06/2014  . Deafness in right ear   . Endometrial cancer ([place]) 03/01/2013  . Macular degeneration 06/08/2011  . Bell's palsy 11/25/2009  . VITAMIN B12 DEFICIENCY 11/10/2007  . MENINGIOMA 05/13/2007  . Hypothyroidism 05/13/2007  . HYPERLIPIDEMIA 05/13/2007  . Essential hypertension 05/13/2007  . Other specified disorders of bladder 05/13/2007     Palliative Care Assessment & Plan   Patient Profile: 87 y.o.femalewith past medical history of deafness in right ear, near complete blindness due to macular degeneration, hypertension, hyperlipidemia,  hypothyroidism, right facial droop due to Bell's palsy, and endometrial cancer in 2014 [facility] [place] of left facial droop, slurred speech, left-sided weakness. Code stroke was called and neurology consulted.Dr. Cheral Marker spoke with family who requested no heroic measures including no IR intervention.Patient was admitted on5/3/2022with acute ischemic right MCA stroke,carotid stenosis,abnormal CT of neck.  Patient evaluated by neurology and recommended medical admission with no aggressive interventions/thrombectomy/anticoagulation as per discussion with patient's son.  Assessment: Acute ischemic right MCA infarct Carotid stenosis Hypertension Abnormal CT of the neck Macular degeneration Terminal care  Recommendations/Plan:  Continue full comfort measures  Continue DNR/DNI as previously documented  [facility] evaluation is still pending, per notes they do not have bed availability today  Family is agreeable for referral to [place] notified  Family is okay with patient discharging to [facility] or [facility] facility -which ever can offer a bed first.  They are hopeful patient can be discharged to a residential hospice facility before becoming too unstable for transfer  Continue current comfort focused medication regimen - patient appears comfortable  Nursing to provide frequent assessments and administer PRN medications as clinically necessary to ensure EOL comfort  PMT will continue to follow and support holistically  Goals of Care and Additional Recommendations:  Limitations on Scope of Treatment: Full Comfort Care  Code Status:    Code Status  Orders  (From admission, onward)         Start     Ordered   12/25/20 1249  Do not attempt resuscitation (DNR)  Continuous       Question Answer Comment  In the event of cardiac or respiratory ARREST Do not call a "code blue"   In the event of cardiac or respiratory ARREST Do not perform Intubation, CPR, defibrillation or ACLS   In the event of cardiac or respiratory ARREST Use medication by any route, position, wound care, and other measures to relive pain and suffering. May use oxygen, suction and manual treatment of airway obstruction as needed for comfort.      12/25/20 1255        Code Status History    Date Active Date Inactive Code Status Order ID Comments User Context   12/24/2020 1445 12/25/2020 1255 DNR [id number]  Guilford Shi, MD ED   Advance Care Planning Activity       Prognosis:   Hours - Days  Discharge Planning:  Hospice facility vs hospital death  Care plan was discussed with primary RN, son-in-law/Glenn, TOC, [facility] liaison, Dr. Algis Liming  Thank you for allowing the Palliative Medicine Team to assist in the care of this patient.   Total Time  36 minutes Prolonged Time Billed  no       Greater than 50%  of this time was spent counseling and coordinating care related to the above assessment and plan.  Lin Landsman, NP  Please contact Palliative Medicine Team phone at [phone] for questions and concerns.   *Portions of this note are a verbal dictation therefore any spelling and/or grammatical errors are due to the "[employer]" system interpretation.

## 2020-12-30 NOTE — Progress Notes (Signed)
[facility])  Hospital Liaison Note:  Update on [place] Referral from 12/28/20:  Unfortunately, [facility] has no bed available to offer patient today.   Unable to reach family members [name] and Lattie Haw) to update but will continue to reach out via telephone.  TOC made aware of above.  [facility] will continue to follow and update daily or sooner if a bed becomes available.  Please do not hesitate to call with any hospice related questions,  [facility], RN [facility] HLT [phone]

## 2020-12-30 NOTE — TOC Transition Note (Signed)
Transition of Care [facility]) - CM/SW Discharge Note   Patient Details  Name: Jessica Haynes MRN: [id number] Date of Birth: [birth year]  [facility] CM/SW Contact:  Geralynn Ochs, LCSW Phone Number: 12/30/2020, 2:51 PM   Clinical Narrative:   Nurse to call report to [phone].    Final next level of care: [place] Barriers to Discharge: Barriers Resolved   Patient Goals and CMS Choice   CMS Medicare.gov Compare Post Acute Care list provided to:: Patient Represenative (must comment) Choice offered to / list presented to : Adult Children  Discharge Placement                Patient to be transferred to facility by: [place] Name of family member notified: [name] Patient and family notified of of transfer: 12/30/20  Discharge Plan and Services   Discharge Planning Services: CM Consult Post Acute Care Choice: [place]                               Social Determinants of Health (SDOH) Interventions     Readmission Risk Interventions No flowsheet data found.

## 2020-12-30 NOTE — Discharge Summary (Signed)
Physician Discharge Summary  Jessica Haynes TKZ:[id number] DOB: Apr 15, 1923  PCP: Maurice Small, MD  Admitted from: Independent living facility [facility]: [facility] date: 12/24/2020 Discharge date: 12/30/2020  Recommendations for Outpatient Follow-up:    Contact information for follow-up providers    [facility] Follow up.   Why: Follow-up regarding ongoing residential hospice care.       Maurice Small, MD Follow up.   Specialty: Family Medicine Why: [place] Fresh information: [address] [place] [address] [phone]            Contact information for after-discharge care    Destination    [facility] Preferred SNF .   Service: Skilled Nursing Contact information: [address] [place]: None    Equipment/Devices: None    Discharge Condition: Poor prognosis, likely <2 weeks.   Code Status: DNR Diet recommendation:  Discharge Diet Orders (From admission, onward)    Start     Ordered   12/30/20 0000  Diet general       Comments: Comfort feeds of choice.   12/30/20 1410           Discharge Diagnoses:  Principal Problem:   CVA (cerebral vascular accident) [facility]) Active Problems:   MENINGIOMA   Hypothyroidism   Bell's palsy   Essential hypertension   Macular degeneration   Endometrial cancer [facility])   Brief Summary: 87 year old female with medical history significant for but not limited to hypertension, hyperlipidemia, hypothyroidism, near complete blindness due to macular degeneration, deafness in right ear, right facial droop due to Bell's palsy (has?  Gold seed in eyelid to help close the eyelid), endometrial CA, chronic bladder incontinence and cognitive dysfunction at baseline, resident of independent living facility, presented to the ED after family noted left-sided weakness.   Neurology was consulted.  Neurology and subsequently palliative care team discussed at length with patient's son-in-law who is her healthcare POA who transitioned her to comfort oriented care on 5/4.  However on 5/5, POA called the palliative care team indicating that he now wanted patient to go to SNF for rehab.  Since then patient condition has rapidly declined, has been mostly unresponsive, unable to even take her oral meds, little to no oral intake.  Palliative care team have been coordinating care with POA, now transitioning to full comfort care and as per palliative care team discussions with POA, being discharged to residential hospice today for end-of-life care.   Assessment & Plan:    Acute ischemic right MCA stroke Has history of right-sided Bell's palsy.  Now new onset of left-sided weakness and left-sided neglect.  CT head showed new hypodensity in the high right parietal white matter suggestive of indeterminate but interval white matter infarct.  Also noted on CT is a large partially calcified mass along the right middle cranial fossa with extra axial mass infiltrating the skull base and infratemporal soft tissues better characterized on remote MRI from 2011.  Suspected surrounding edema in the temporal lobe.  CTA of the head and neck showed severe atherosclerosis of the right carotid bifurcation with severe, nearly occlusive narrowing of the proximal ICA.  In the more distal ICA, there is only faint/thready opacification through the petrous carotid with complete occlusion of the ICA at the cavernous sinus.  CT perfusion study:  58 mL of penumbra in the posterior right MCA territory.  Perfusion findings suggest that the a forementioned occlusion may be in part acute (possibly acute thrombosis superimposed on existing atherosclerosis/stenosis) but no evidence of core infarct.  2D echo: LVEF 60 to 65% and grade 1 diastolic dysfunction.  LDL 146, A1c 5.6.  Neurology was consulted and discussed in  length with patient's son-in-law, POA risks and benefits of mechanical thrombectomy.  He did not want to have invasive measures to manage her medical or surgical condition.  On 5/4, I discussed with him as well and he reiterated nonaggressive evaluation and management, including no MRI especially given metallic object on the right eyelid, wished comfort oriented care.  Palliative care team has since consulted and patient was transitioned to full comfort measures in house.  Discharge disposition being determined by the [facility] team.  POA wishes for patient to return to [facility] with additional support but may not be an option versus home with hospice.  However on 5/5, POA called the palliative care team indicating that he wanted patient now to go to SNF.    Per stroke MD follow-up on 5/5: Stroke likely due to atherosclerosis, was on aspirin 81 Mg daily prior to admission, now recommend aspirin 81 mg daily and Plavix 75 Mg daily for 3 weeks followed by aspirin alone.  Since then patient condition has rapidly declined, has been mostly unresponsive, unable to even take her oral meds, little to no oral intake.  Palliative care team have been coordinating care with POA, now transitioned to full comfort care and as per palliative care team discussions with POA, being discharged to residential hospice today for end-of-life care.  All medications nonessential to comfort have been discontinued.  Only comfort oriented medications have been continued.  Carotid stenosis: Imaging results as above.  No heroic interventions. Hypertension:  Antihypertensives discontinued.  Now full comfort care. Hyperlipidemia: LDL 146, goal <70.    Statins discontinued due to full comfort care. Acute kidney injury complicating stage III B CKD: Now on comfort measures only. Endometrial CA: Abnormal CT of the neck: Mucous plugging of the enlarged bronchus in the left upper lobe with distal tree-in-bud opacification, suggesting  underlying bronchial obstruction. She is not a candidate for any kind of aggressive evaluation or management given advanced age and frail status. Hypothyroidism : Discontinued Synthroid. Macular degeneration, cognitive decline, hallucinations/adult failure to thrive: Adult failure to thrive: Multifactorial due to very advanced age, frail physical health, multiple severe significant chronic comorbidities complicated by acute illness as above and underlying dementia.  Body mass index is 24.68 kg/m.        Consultants:   Neurology Palliative care medicine  Procedures:   None   Discharge Instructions  Discharge Instructions    Call MD for:  difficulty breathing, headache or visual disturbances   Complete by: As directed    Call MD for:  extreme fatigue   Complete by: As directed    Call MD for:  persistant dizziness or light-headedness   Complete by: As directed    Call MD for:  persistant nausea and vomiting   Complete by: As directed    Call MD for:  severe uncontrolled pain   Complete by: As directed    Call MD for:  temperature >100.4   Complete by: As directed    Diet general   Complete by: As directed    Comfort feeds of choice.   Increase activity slowly   Complete by: As directed  No wound care   Complete by: As directed        Medication List    STOP taking these medications   AMBULATORY NON FORMULARY MEDICATION   aspirin 81 MG tablet   colestipol 1 g tablet Commonly known as: COLESTID   doxycycline 100 MG capsule Commonly known as: VIBRAMYCIN   hydrochlorothiazide 25 MG tablet Commonly known as: HYDRODIURIL   levothyroxine 100 MCG tablet Commonly known as: SYNTHROID   lisinopril 20 MG tablet Commonly known as: ZESTRIL   medroxyPROGESTERone 10 MG tablet Commonly known as: PROVERA   mupirocin ointment 2 % Commonly known as: BACTROBAN   OVER THE COUNTER MEDICATION   OVER THE COUNTER MEDICATION   sertraline 100 MG  tablet Commonly known as: ZOLOFT     TAKE these medications   HYDROmorphone HCl 1 MG/ML Liqd Commonly known as: DILAUDID Take 0.25-0.5 mLs (0.25-0.5 mg total) by mouth every 6 (six) hours as needed for moderate pain or severe pain (dyspnea, increased work of breathing, respiratory rate >25, distress).   hydroxypropyl methylcellulose / hypromellose 2.5 % ophthalmic solution Commonly known as: ISOPTO TEARS / GONIOVISC Place 1 drop into both eyes 3 (three) times daily as needed for dry eyes.   LORazepam 2 MG/ML concentrated solution Commonly known as: ATIVAN Place 0.5 mLs (1 mg total) under the tongue every 8 (eight) hours as needed for anxiety, seizure, sedation or sleep.   OLANZapine zydis 5 MG disintegrating tablet Commonly known as: ZYPREXA Take 1.5 tablets (7.5 mg total) by mouth at bedtime.      Allergies  Allergen Reactions  . Clarithromycin Swelling and Other (See Comments)    "Causes swelling"- Site of swelling not known by son-in-law/caregiver  . Neomycin Itching and Other (See Comments)    Redness, also  . Penicillins Swelling and Other (See Comments)    "Causes swelling"- Site of swelling not known by son-in-law/caregiver      Procedures/Studies: CT CEREBRAL PERFUSION W CONTRAST  Result Date: 12/24/2020 CLINICAL DATA:  Neuro deficit, acute stroke suspected. EXAM: CT ANGIOGRAPHY HEAD AND NECK CT PERFUSION BRAIN TECHNIQUE: Multidetector CT imaging of the head and neck was performed using the standard protocol during bolus administration of intravenous contrast. Multiplanar CT image reconstructions and MIPs were obtained to evaluate the vascular anatomy. Carotid stenosis measurements (when applicable) are obtained utilizing NASCET criteria, using the distal internal carotid diameter as the denominator. Multiphase CT imaging of the brain was performed following IV bolus contrast injection. Subsequent parametric perfusion maps were calculated using RAPID software. CONTRAST:   177m OMNIPAQUE IOHEXOL 350 MG/ML SOLN COMPARISON:  Same day CT head. FINDINGS: CTA NECK FINDINGS Aortic arch: Predominately calcific atherosclerosis of the aorta. Great vessel origins are patent. Right carotid system: Extensive calcific and noncalcific atherosclerosis at the carotid bifurcation. Severe, nearly occlusive atherosclerosis at the carotid bifurcation with only faint, predominately peripheral opacification in the ICA in the neck and faint, thready opacification at the skull base. Left carotid system: Calcific and noncalcific atherosclerosis at the carotid bifurcation with ulceration. No evidence of greater than 50% stenosis. Vertebral arteries: Mildly right dominant. Tortuous vertebral arteries bilaterally without hemodynamically significant stenosis. Skeleton: Moderate to severe multilevel degenerative disc disease. Multilevel facet/uncovertebral hypertrophy with varying degrees of neural foraminal stenosis. Other neck: Heterogeneous thyroid with multiple large nodules, compatible with goiter. Upper chest: Mucous plugging of an enlarged bronchus in the left upper lobe with distal tree-in-bud opacification Review of the MIP images confirms the above findings CTA HEAD FINDINGS Anterior circulation: Faint/thready opacification through  the petrous carotid with complete occlusion of the ICA at the cavernous sinus. Non-opacification of the right proximal MCA and proximal right A1 ACA. More distal right A2 ACA and distal right MCA branches are likely opacified by collateral flow. Left ICA is patent with mild cavernous ICA stenosis due to atherosclerosis. Severe focal narrowing of the left M1 MCA with distal MCA opacification on the left. Left A1 ACA and left A2 ACA are patent. Posterior circulation: Bilateral intradural vertebral arteries, basilar artery and posterior cerebral arteries are patent. Multifocal moderate to severe stenosis of bilateral P2 PCAs. Venous sinuses: As permitted by contrast timing,  patent. Review of the MIP images confirms the above findings CT Brain Perfusion Findings: ASPECTS: 9 CBF (<30%) Volume: 33m Perfusion (Tmax>6.0s) volume: 561mMismatch Volume: 5863mnfarction Location:[facility] IMPRESSION: 1. Severe atherosclerosis at the right carotid bifurcation with severe, nearly occlusive narrowing of the proximal ICA. In the more distal ICA, there is only faint/thready opacification through the petrous carotid with complete occlusion of the ICA at the cavernous sinus. Non-opacification of the right M1 MCA and proximal right A1 ACA. More distal right A2 ACA and distal right MCA branches are likely opacified by collateral flow. 2. On perfusion imaging, there is 58 mL of penumbra in the posterior right MCA territory. While there are no recent priors to confirm, perfusion findings suggest that the aforementioned occlusion may be in part acute (possibly acute thrombosis superimposed on existing atherosclerosis/stenosis). No evidence of core infarct. 3. Severe narrowing of the left M1 MCA with distal opacification. 4. Multifocal moderate to severe stenosis of bilateral P2 PCAs. 5. Extensive left carotid bifurcation atherosclerosis without greater than 50% narrowing. 6. Mucous plugging of an enlarged bronchus in the left upper lobe with distal tree-in-bud opacification, suggesting underlying bronchial obstruction (either from fluid/debris or a bronchial lesion) and distal infectious/inflammatory process. Recommend follow-up chest CT in 2-3 months to further evaluate and ensure stability and/or resolution. 7. Heterogeneous thyroid with multiple large nodules, compatible with goiter. Consider follow-up thyroid ultrasound to further characterize. Critical findings discussed with Dr. LinCheral Marker 11:54 a.m. via telephone. Electronically Signed   By: FreMargaretha Sheffield   On: 12/24/2020 12:36   DG CHEST PORT 1 VIEW  Result Date: 12/24/2020 CLINICAL DATA:  CVA, abnormal appearance of the upper chest on CT of  the neck EXAM: PORTABLE CHEST 1 VIEW COMPARISON:  12/24/2020, 10/10/2019 FINDINGS: Single frontal view of the chest demonstrates an unremarkable cardiac silhouette. No acute airspace disease, effusion, or pneumothorax. The mucous plugging within the left upper lobe seen on CT is not apparent on this portable radiograph. No acute bony abnormalities. IMPRESSION: 1. No acute intrathoracic process. 2. The left upper lobe mucous plugging seen on CT is not appreciated by radiograph. Previous recommendation for follow-up chest CT in 2-3 months is again recommended. Electronically Signed   By: MicRanda NgoD.   On: 12/24/2020 17:25   ECHOCARDIOGRAM COMPLETE  Result Date: 12/25/2020    ECHOCARDIOGRAM REPORT   Patient Name:   LOUSUHEY RADFORDte of Exam: 12/25/2020 Medical Rec #:  [id number]      Height:       63.0 in Accession #:    [id number]     Weight:       139.3 lb Date of Birth:  8/108-23-1924      BSA:          1.658 m Patient Age:    87 years  BP:           172/51 mmHg Patient Gender: F                 HR:           88 bpm. Exam Location:  Inpatient Procedure: 2D Echo, Cardiac Doppler and Color Doppler Indications:    Stroke  History:        Patient has no prior history of Echocardiogram examinations.                 Risk Factors:Hypertension.  Sonographer:    Cammy Brochure Referring Phys: [id number] [place] Shi  Sonographer Comments: Image acquisition challenging due to uncooperative patient. IMPRESSIONS  1. Left ventricular ejection fraction, by estimation, is 60 to 65%. The left ventricle has normal function. The left ventricle has no regional wall motion abnormalities. There is mild left ventricular hypertrophy of the basal and septal segments. Left ventricular diastolic parameters are consistent with Grade I diastolic dysfunction (impaired relaxation).  2. Right ventricular systolic function is normal. The right ventricular size is normal.  3. The pericardial effusion is RA posterior.   4. Small diastolic gradient across MV from MAC and leaflet calcium no MS . The mitral valve is degenerative. No evidence of mitral valve regurgitation. No evidence of mitral stenosis. Moderate mitral annular calcification.  5. The aortic valve is tricuspid. There is moderate calcification of the aortic valve. Aortic valve regurgitation is not visualized. Mild to moderate aortic valve sclerosis/calcification is present, without any evidence of aortic stenosis.  6. The inferior vena cava is normal in size with greater than 50% respiratory variability, suggesting right atrial pressure of 3 mmHg. FINDINGS  Left Ventricle: Left ventricular ejection fraction, by estimation, is 60 to 65%. The left ventricle has normal function. The left ventricle has no regional wall motion abnormalities. The left ventricular internal cavity size was normal in size. There is  mild left ventricular hypertrophy of the basal and septal segments. Left ventricular diastolic parameters are consistent with Grade I diastolic dysfunction (impaired relaxation). Right Ventricle: The right ventricular size is normal. No increase in right ventricular wall thickness. Right ventricular systolic function is normal. Left Atrium: Left atrial size was normal in size. Right Atrium: Right atrial size was normal in size. Pericardium: Trivial pericardial effusion is present. The pericardial effusion is RA posterior. Mitral Valve: Small diastolic gradient across MV from MAC and leaflet calcium no MS. The mitral valve is degenerative in appearance. There is moderate thickening of the mitral valve leaflet(s). There is moderate calcification of the mitral valve leaflet(s). Moderate mitral annular calcification. No evidence of mitral valve regurgitation. No evidence of mitral valve stenosis. MV peak gradient, 14.3 mmHg. The mean mitral valve gradient is 6.0 mmHg. Tricuspid Valve: The tricuspid valve is normal in structure. Tricuspid valve regurgitation is mild . No  evidence of tricuspid stenosis. Aortic Valve: The aortic valve is tricuspid. There is moderate calcification of the aortic valve. Aortic valve regurgitation is not visualized. Mild to moderate aortic valve sclerosis/calcification is present, without any evidence of aortic stenosis. Aortic valve mean gradient measures 5.0 mmHg. Aortic valve peak gradient measures 7.4 mmHg. Aortic valve area, by VTI measures 2.43 cm. Pulmonic Valve: The pulmonic valve was normal in structure. Pulmonic valve regurgitation is trivial. No evidence of pulmonic stenosis. Aorta: The aortic root is normal in size and structure. Venous: The inferior vena cava is normal in size with greater than 50% respiratory variability, suggesting right atrial pressure of  3 mmHg. IAS/Shunts: No atrial level shunt detected by color flow Doppler.  LEFT VENTRICLE PLAX 2D LVIDd:         3.10 cm  Diastology LVIDs:         2.30 cm  LV e' medial:   6.74 cm/s LV PW:         1.10 cm  LV E/e' medial: 20.4 LV IVS:        1.20 cm LVOT diam:     1.80 cm LV SV:         74 LV SV Index:   44 LVOT Area:     2.54 cm  RIGHT VENTRICLE             IVC RV S prime:     11.70 cm/s  IVC diam: 1.50 cm LEFT ATRIUM             Index       RIGHT ATRIUM          Index LA diam:        3.20 cm 1.93 cm/m  RA Area:     7.45 cm LA Vol (A2C):   35.9 ml 21.65 ml/m RA Volume:   12.00 ml 7.24 ml/m LA Vol (A4C):   35.0 ml 21.11 ml/m LA Biplane Vol: 35.8 ml 21.59 ml/m  AORTIC VALVE AV Area (Vmax):    2.21 cm AV Area (Vmean):   2.32 cm AV Area (VTI):     2.43 cm AV Vmax:           136.00 cm/s AV Vmean:          105.000 cm/s AV VTI:            0.303 m AV Peak Grad:      7.4 mmHg AV Mean Grad:      5.0 mmHg LVOT Vmax:         118.00 cm/s LVOT Vmean:        95.700 cm/s LVOT VTI:          0.289 m LVOT/AV VTI ratio: 0.95  AORTA Ao Root diam: 3.00 cm MITRAL VALVE                TRICUSPID VALVE MV Area (PHT): 8.16 cm     TR Peak grad:   28.1 mmHg MV Area VTI:   2.64 cm     TR Vmax:         265.00 cm/s MV Peak grad:  14.3 mmHg MV Mean grad:  6.0 mmHg     SHUNTS MV Vmax:       1.89 m/s     Systemic VTI:  0.29 m MV Vmean:      110.0 cm/s   Systemic Diam: 1.80 cm MV Decel Time: 93 msec MV E velocity: 137.50 cm/s MV A velocity: 175.00 cm/s MV E/A ratio:  0.79 Jenkins Rouge MD Electronically signed by Jenkins Rouge MD Signature Date/Time: 12/25/2020/10:01:21 AM    Final    CT HEAD CODE STROKE WO CONTRAST  Result Date: 12/24/2020 CLINICAL DATA:  Code stroke.  Flaccid left-sided, facial droop. EXAM: CT HEAD WITHOUT CONTRAST TECHNIQUE: Contiguous axial images were obtained from the base of the skull through the vertex without intravenous contrast. COMPARISON:  CT head October 10, 2019. FINDINGS: Brain: new hypodensity in the high right parietal white matter (series 2, image 19) similar appearance of a perforator infarct in the right basal ganglia extending and overlying corona radiata. Remote lacunar infarct in the left  cerebellum. No acute hemorrhage. Patchy moderate white matter hypoattenuation, most likely related to chronic microvascular ischemic disease. No midline shift. No visible extra-axial fluid collection. No hydrocephalus. Similar appearance of a large partially calcified mass along the right middle cranial fossa with extra-axial mass infiltrating the skull base and infratemporal soft tissues, better characterized on remote MRI from 2011. Vascular: Calcific atherosclerosis. Poorly evaluated vessels at the skull base due to streak artifact. Skull: No acute fracture. Sinuses/Orbits: Similar apparent metal along the anterior aspect of the right globe, of unclear etiology. Surrounding streak artifact. Clear sinuses. Other: No mastoid effusions. ASPECTS [facility] Stroke Program Early CT Score) - Ganglionic level infarction (caudate, lentiform nuclei, internal capsule, insula, M1-M3 cortex): 7 - Supraganglionic infarction (M4-M6 cortex): 2 Total score (0-10 with 10 being normal): 9 Dr. Cheral Marker paged at  11:45 AM. Awaiting call back at the time of dictation. IMPRESSION: 1. New hypodensity in the high right parietal white matter (series 2, image 19), suggestive of age indeterminate but interval white matter infarct. MRI could further evaluate if the patient is able. ASPECTS is 9. 2. Moderate chronic microvascular ischemic disease and remote lacunar infarcts, as detailed above. 3. Similar appearance of a large partially calcified mass along the right middle cranial fossa with extra-axial mass infiltrating the skull base and infratemporal soft tissues, better characterized on remote MRI from 2011. Suspected surrounding edema in the temporal lobe, although evaluation is limited by streak artifact from apparent metal along the anterior right globe. Electronically Signed   By: Margaretha Sheffield MD   On: 12/24/2020 11:51   CT ANGIO HEAD NECK W WO CM (CODE STROKE)  Result Date: 12/24/2020 CLINICAL DATA:  Neuro deficit, acute stroke suspected. EXAM: CT ANGIOGRAPHY HEAD AND NECK CT PERFUSION BRAIN TECHNIQUE: Multidetector CT imaging of the head and neck was performed using the standard protocol during bolus administration of intravenous contrast. Multiplanar CT image reconstructions and MIPs were obtained to evaluate the vascular anatomy. Carotid stenosis measurements (when applicable) are obtained utilizing NASCET criteria, using the distal internal carotid diameter as the denominator. Multiphase CT imaging of the brain was performed following IV bolus contrast injection. Subsequent parametric perfusion maps were calculated using RAPID software. CONTRAST:  148m OMNIPAQUE IOHEXOL 350 MG/ML SOLN COMPARISON:  Same day CT head. FINDINGS: CTA NECK FINDINGS Aortic arch: Predominately calcific atherosclerosis of the aorta. Great vessel origins are patent. Right carotid system: Extensive calcific and noncalcific atherosclerosis at the carotid bifurcation. Severe, nearly occlusive atherosclerosis at the carotid bifurcation with  only faint, predominately peripheral opacification in the ICA in the neck and faint, thready opacification at the skull base. Left carotid system: Calcific and noncalcific atherosclerosis at the carotid bifurcation with ulceration. No evidence of greater than 50% stenosis. Vertebral arteries: Mildly right dominant. Tortuous vertebral arteries bilaterally without hemodynamically significant stenosis. Skeleton: Moderate to severe multilevel degenerative disc disease. Multilevel facet/uncovertebral hypertrophy with varying degrees of neural foraminal stenosis. Other neck: Heterogeneous thyroid with multiple large nodules, compatible with goiter. Upper chest: Mucous plugging of an enlarged bronchus in the left upper lobe with distal tree-in-bud opacification Review of the MIP images confirms the above findings CTA HEAD FINDINGS Anterior circulation: Faint/thready opacification through the petrous carotid with complete occlusion of the ICA at the cavernous sinus. Non-opacification of the right proximal MCA and proximal right A1 ACA. More distal right A2 ACA and distal right MCA branches are likely opacified by collateral flow. Left ICA is patent with mild cavernous ICA stenosis due to atherosclerosis. Severe focal narrowing of the  left M1 MCA with distal MCA opacification on the left. Left A1 ACA and left A2 ACA are patent. Posterior circulation: Bilateral intradural vertebral arteries, basilar artery and posterior cerebral arteries are patent. Multifocal moderate to severe stenosis of bilateral P2 PCAs. Venous sinuses: As permitted by contrast timing, patent. Review of the MIP images confirms the above findings CT Brain Perfusion Findings: ASPECTS: 9 CBF (<30%) Volume: 80m Perfusion (Tmax>6.0s) volume: 584mMismatch Volume: 5824mnfarction Location:[facility] IMPRESSION: 1. Severe atherosclerosis at the right carotid bifurcation with severe, nearly occlusive narrowing of the proximal ICA. In the more distal ICA, there is only  faint/thready opacification through the petrous carotid with complete occlusion of the ICA at the cavernous sinus. Non-opacification of the right M1 MCA and proximal right A1 ACA. More distal right A2 ACA and distal right MCA branches are likely opacified by collateral flow. 2. On perfusion imaging, there is 58 mL of penumbra in the posterior right MCA territory. While there are no recent priors to confirm, perfusion findings suggest that the aforementioned occlusion may be in part acute (possibly acute thrombosis superimposed on existing atherosclerosis/stenosis). No evidence of core infarct. 3. Severe narrowing of the left M1 MCA with distal opacification. 4. Multifocal moderate to severe stenosis of bilateral P2 PCAs. 5. Extensive left carotid bifurcation atherosclerosis without greater than 50% narrowing. 6. Mucous plugging of an enlarged bronchus in the left upper lobe with distal tree-in-bud opacification, suggesting underlying bronchial obstruction (either from fluid/debris or a bronchial lesion) and distal infectious/inflammatory process. Recommend follow-up chest CT in 2-3 months to further evaluate and ensure stability and/or resolution. 7. Heterogeneous thyroid with multiple large nodules, compatible with goiter. Consider follow-up thyroid ultrasound to further characterize. Critical findings discussed with Dr. LinCheral Marker 11:54 a.m. via telephone. Electronically Signed   By: FreMargaretha Sheffield   On: 12/24/2020 12:36      Subjective: No history obtainable from patient.  As per RN report, has been mostly unresponsive since yesterday, no oral intake and not even taking medications.  Discharge Exam:  Vitals:   12/29/20 0800 12/29/20 1200 12/29/20 2254 12/30/20 1144  BP:   (!) 108/53 (!) 175/73  Pulse:   97 (!) 106  Resp: (!) 26 (!) _0 Temp:   97.8 F (36.6 C) 98 F (36.7 C)  TempSrc:   Oral Oral  SpO2:   95% 99%  Weight:         General exam: Elderly female, moderately built,  frail and chronically ill looking lying in bed.  Does not appear in any distress.  Looks significantly worse than she did last week. Respiratory system:  Poor inspiratory effort.  No increased work of breathing. Cardiovascular system: S1 & S2 heard, RRR. No JVD, murmurs, rubs, gallops or clicks. No pedal edema.  Telemetry has been discontinued. Gastrointestinal system: Abdomen is nondistended, soft and nontender. No organomegaly or masses felt. Normal bowel sounds heard. Central nervous system:  No eye-opening or verbal response.  Occasionally moves right upper extremity spontaneously. Extremities:  As noted above otherwise not moving limbs. Skin: No rashes, lesions or ulcers Psychiatry: Judgement and insight impaired. Mood & affect cannot be assessed.     The results of significant diagnostics from this hospitalization (including imaging, microbiology, ancillary and laboratory) are listed below for reference.     Microbiology: Recent Results (from the past 240 hour(s))  SARS CORONAVIRUS 2 (TAT 6-24 HRS) Nasopharyngeal Nasopharyngeal Swab     Status: None   Collection Time: 12/24/20 12:40 PM  Specimen: Nasopharyngeal Swab  Result Value Ref Range Status   SARS Coronavirus 2 NEGATIVE NEGATIVE Final    Comment: (NOTE) SARS-CoV-2 target nucleic acids are NOT DETECTED.  The SARS-CoV-2 RNA is generally detectable in upper and lower respiratory specimens during the acute phase of infection. Negative results do not preclude SARS-CoV-2 infection, do not rule out co-infections with other pathogens, and should not be used as the sole basis for treatment or other patient management decisions. Negative results must be combined with clinical observations, patient history, and epidemiological information. The expected result is Negative.  Fact Sheet for Patients: SugarRoll.be  Fact Sheet for Healthcare Providers: https://www.woods-mathews.com/  This  test is not yet approved or cleared by the [place] FDA and  has been authorized for detection and/or diagnosis of SARS-CoV-2 by FDA under an Emergency Use Authorization (EUA). This EUA will remain  in effect (meaning this test can be used) for the duration of the COVID-19 declaration under Se ction 564(b)(1) of the Act, 21 U.S.C. section 360bbb-3(b)(1), unless the authorization is terminated or revoked sooner.  Performed at [facility], [place] [address] [place], [place] [address]   SARS CORONAVIRUS 2 (TAT 6-24 HRS) Nasopharyngeal Nasopharyngeal Swab     Status: None   Collection Time: 12/27/20 11:17 PM   Specimen: Nasopharyngeal Swab  Result Value Ref Range Status   SARS Coronavirus 2 NEGATIVE NEGATIVE Final    Comment: (NOTE) SARS-CoV-2 target nucleic acids are NOT DETECTED.  The SARS-CoV-2 RNA is generally detectable in upper and lower respiratory specimens during the acute phase of infection. Negative results do not preclude SARS-CoV-2 infection, do not rule out co-infections with other pathogens, and should not be used as the sole basis for treatment or other patient management decisions. Negative results must be combined with clinical observations, patient history, and epidemiological information. The expected result is Negative.  Fact Sheet for Patients: SugarRoll.be  Fact Sheet for Healthcare Providers: https://www.woods-mathews.com/  This test is not yet approved or cleared by the [place] FDA and  has been authorized for detection and/or diagnosis of SARS-CoV-2 by FDA under an Emergency Use Authorization (EUA). This EUA will remain  in effect (meaning this test can be used) for the duration of the COVID-19 declaration under Se ction 564(b)(1) of the Act, 21 U.S.C. section 360bbb-3(b)(1), unless the authorization is terminated or revoked sooner.  Performed at [facility], [place] [address]  [place], [place] [address]      Labs: CBC: Recent Labs  Lab 12/24/20 1128 12/28/20 1009  WBC 8.6 8.8  NEUTROABS 6.8  --   HGB 12.1  11.2* 12.3  HCT 36.8  33.0* 37.7  MCV 93.9 94.7  PLT 177 379    Basic Metabolic Panel: Recent Labs  Lab 12/24/20 1128 12/28/20 1009  NA 142  143 144  K 4.3  4.4 4.1  CL 110  110 112*  CO2 25 20*  GLUCOSE 122*  117* 117*  BUN 22  30* 51*  CREATININE 1.66*  1.80* 1.90*  CALCIUM 9.4 9.3    Liver Function Tests: Recent Labs  Lab 12/24/20 1128  AST 24  ALT 11  ALKPHOS 48  BILITOT 0.9  PROT 6.3*  ALBUMIN 3.8    CBG: Recent Labs  Lab 12/24/20 1124  GLUCAP 113*    I discussed in detail with patient's son-in-law/POA, updated care and answered all questions.  He was appreciative of the call.  Time coordinating discharge: 25 minutes  SIGNED:  Vernell Leep,  MD, [place], [facility]. [facility]  To contact the attending provider between 7A-7P or the covering provider during after hours 7P-7A, please log into the web site [email or web address] and access using universal [place] password for that web site. If you do not have the password, please call the hospital operator.

## 2020-12-30 NOTE — Progress Notes (Signed)
RN called report to [place] and gave report to [employer]

## 2020-12-30 NOTE — Progress Notes (Signed)
PT Cancellation Note  Patient Details Name: Jessica Haynes MRN: [id number] DOB: [birth year]   Cancelled Treatment:    Reason Eval/Treat Not Completed: PT screened, no needs identified, will sign off - pt now going hospice, PT to sign off.   Stacie Glaze, PT DPT Acute Rehabilitation Services Pager [phone]  Office [phone]   [name] 12/30/2020, 2:32 PM

## 2020-12-30 NOTE — Progress Notes (Signed)
   TC from [facility] with transitions of care asking for me to review the pt and see if our MD thinks she is appropriate for our hospice in patient facility. Spoke to [place] pt's SIL and discussed hospice care, goals of pt and levels of care with him. He does agree that he wants to pursue comfort care for the pt. He confirms she is a DNR. I spoke to my MD and she did approve the pt for hospice care at the [facility] in [facility]. Paperwork has been completed.   I spoke to Kathlee Nations to let her know that the pt could come as soon as MD was ready to d/c she will arrange ambulance and notify [name] when completed.  Webb Silversmith RN [phone]

## 2020-12-30 NOTE — Progress Notes (Signed)
RN attempted to give pt some water but pt refused. Pt is still very lethargic and unable to follow commands.

## 2021-01-22 DEATH — deceased

## 2021-01-23 IMAGING — CT CT MAXILLOFACIAL W/O CM
3 series · 16 of 47 positions shown, 19 images · non-contrast
Comparison: None.

CLINICAL DATA: Fall.  Hit face.  Nose bleed.

EXAM:
CT MAXILLOFACIAL WITHOUT CONTRAST
TECHNIQUE: Multidetector CT imaging of the maxillofacial structures was
performed. Multiplanar CT image reconstructions were also generated.

[Series 3: max soft · axial · 0.36mm/px · z∈[-204,-78]mm · 10 of 73 slices shown, 13 images]
[im 5/73  brain]
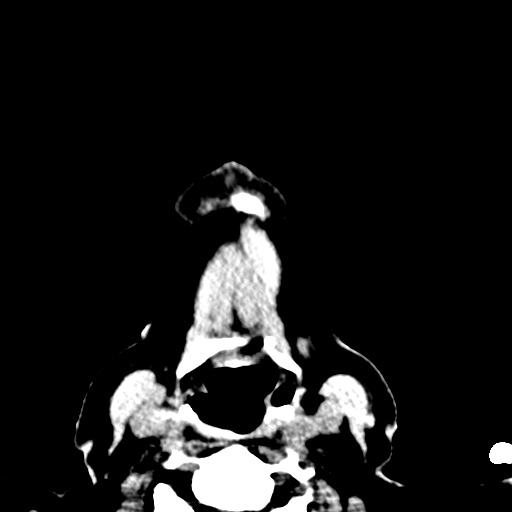
[im 5/73  bone]
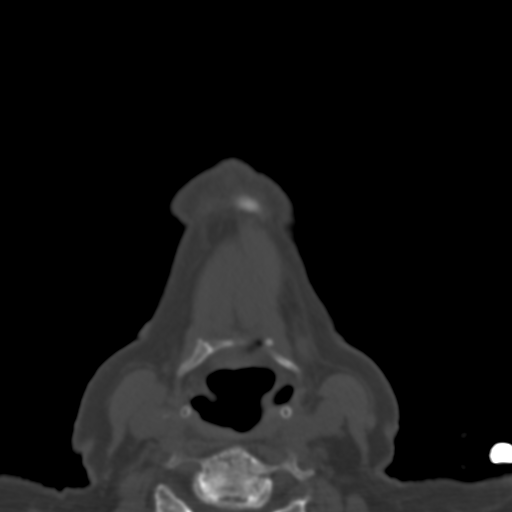
[im 13/73  bone]
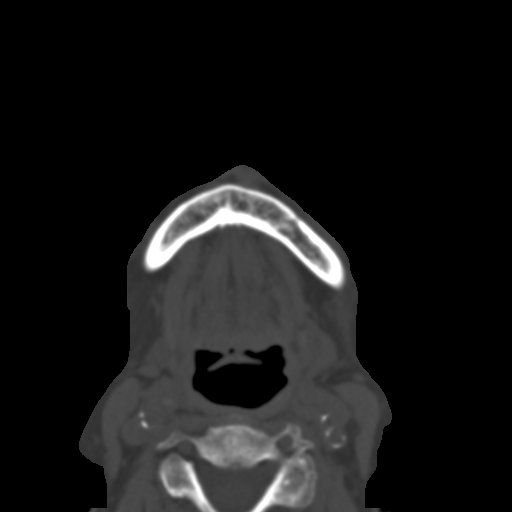
[im 20/73  bone]
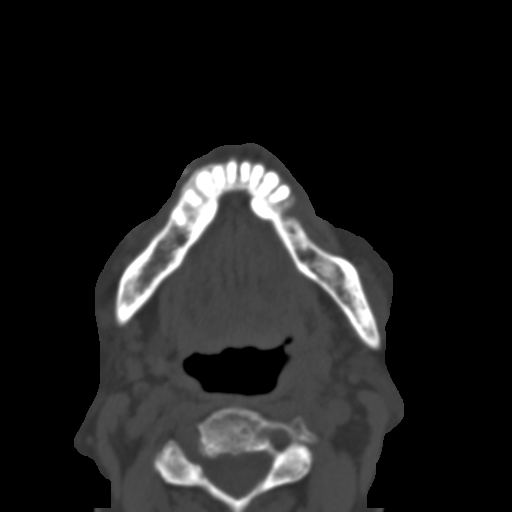
[im 25/73  bone]
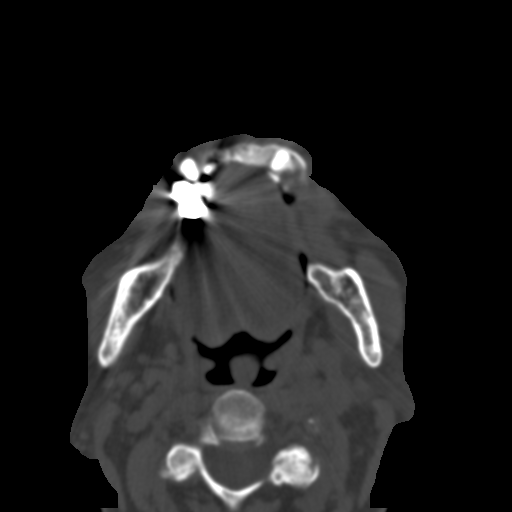
[im 33/73  brain]
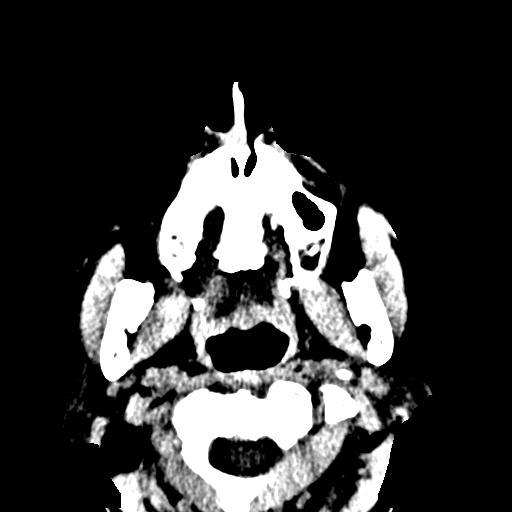
[im 33/73  bone]
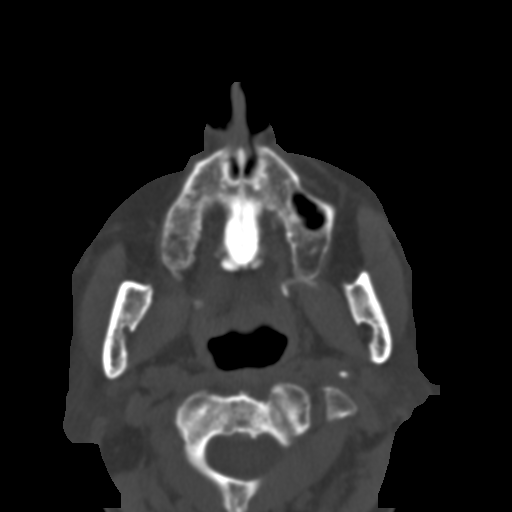
[im 40/73  bone]
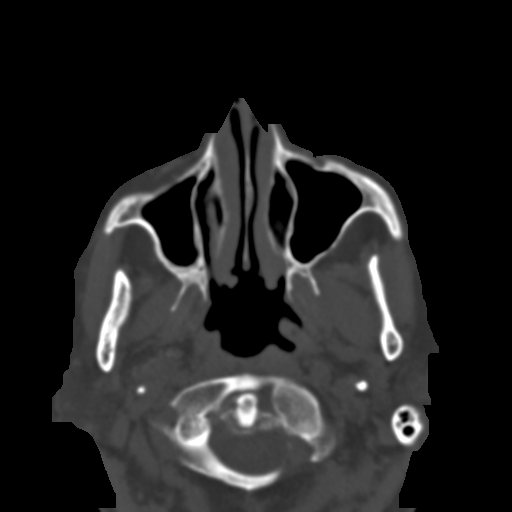
[im 48/73  bone]
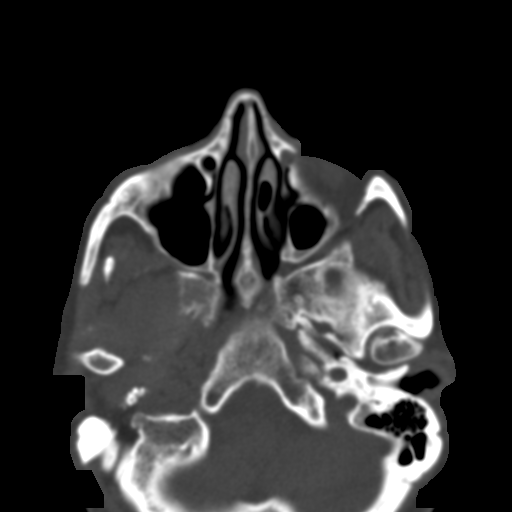
[im 55/73  bone]
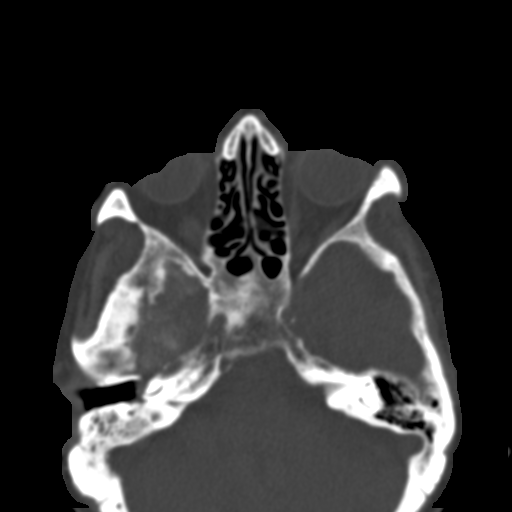
[im 60/73  brain]
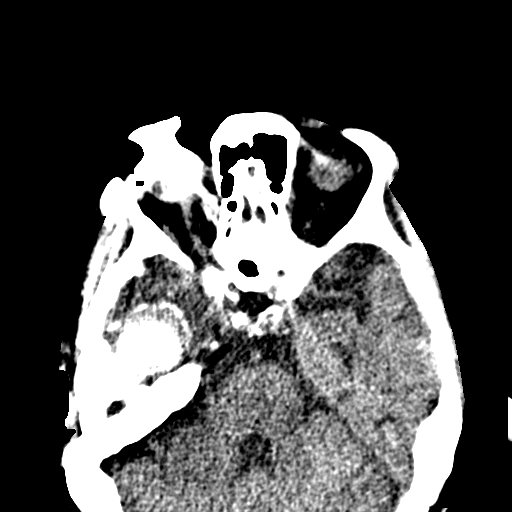
[im 60/73  bone]
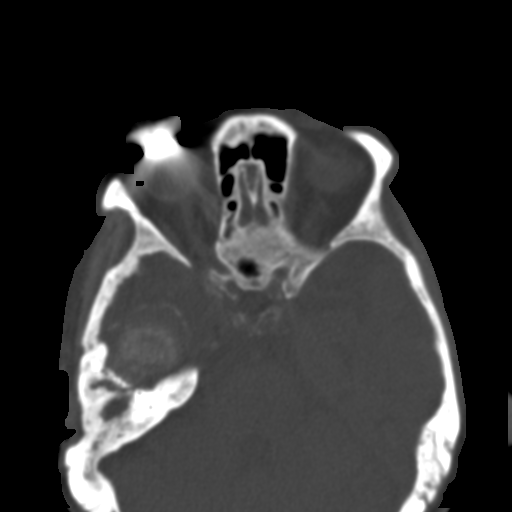
[im 68/73  bone]
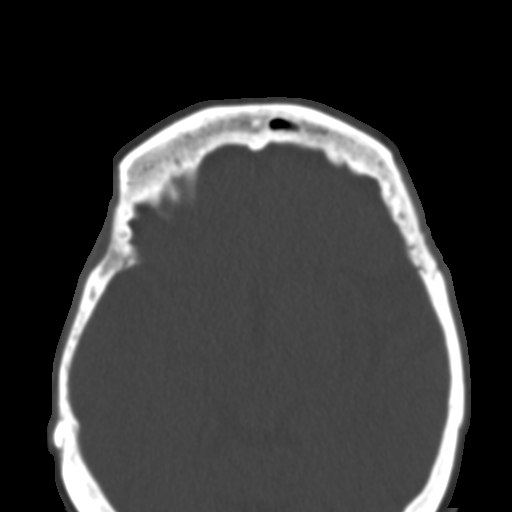

[Series 7: coronal soft · coronal · 0.32mm/px · 3 of 70 slices shown]
[im 24/70  bone]
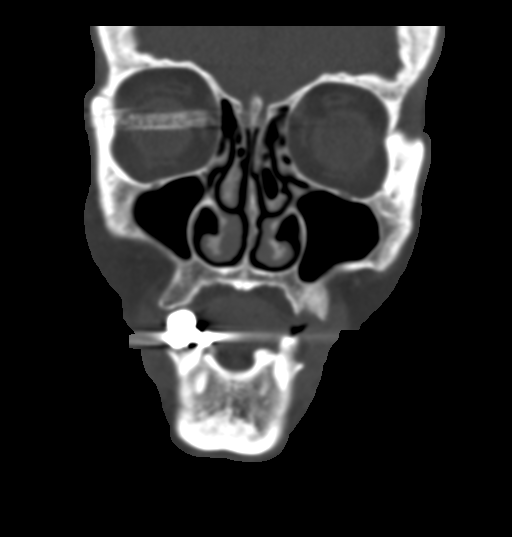
[im 31/70  bone]
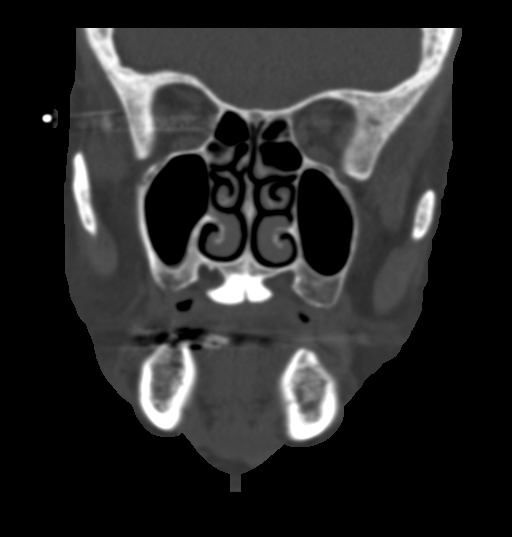
[im 39/70  bone]
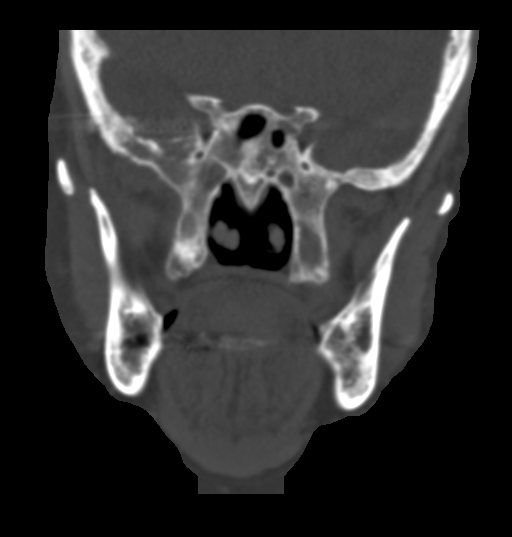

[Series 8: sagittal soft · sagittal · 0.31mm/px · 3 of 72 slices shown]
[im 24/72  bone]
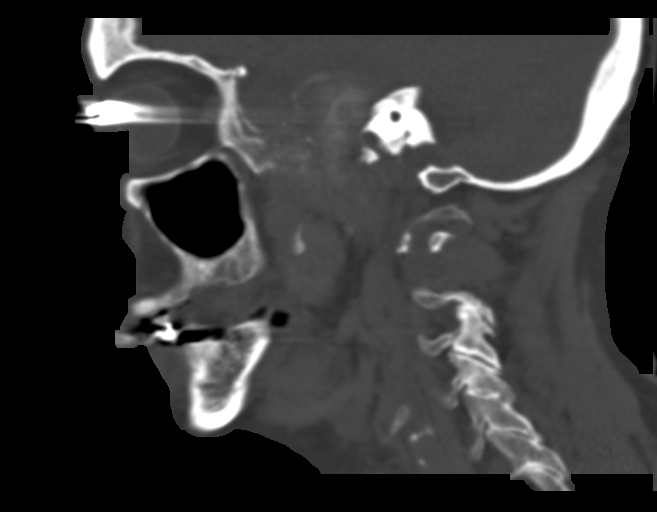
[im 36/72  bone]
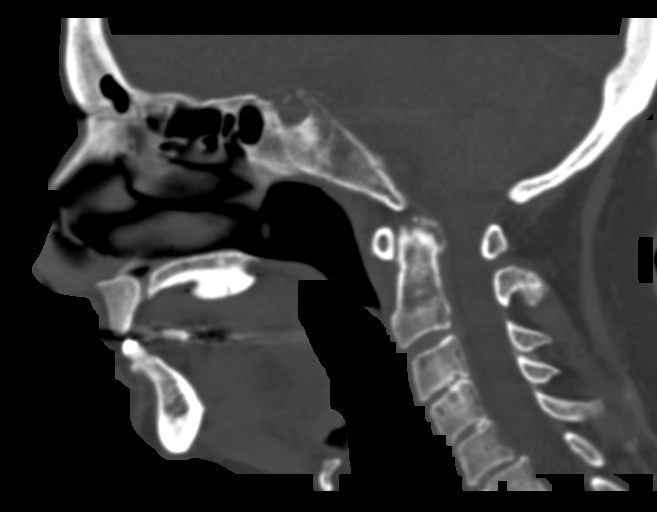
[im 48/72  bone]
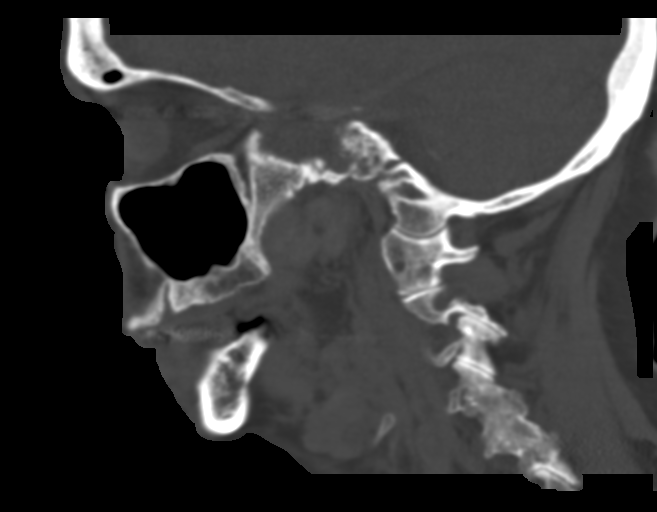

[16 of 47 positions shown; findings below may reference images not displayed]

FINDINGS: Osseous: No fracture or mandibular dislocation. No destructive
process.

Orbits: No fracture. Globes are intact. Metallic structure noted in
the anterior right orbit of unknown etiology, causing beam hardening
artifact.

Sinuses: Clear

Soft tissues: Negative

Limited intracranial: See head CT report.
IMPRESSION: No evidence of facial or orbital fracture.

## 2021-01-23 IMAGING — CR DG FOREARM 2V*R*
2 series · 2 of 2 positions shown · non-contrast
Comparison: None.

CLINICAL DATA: Recent fall with arm pain, initial encounter

EXAM:
RIGHT FOREARM - 2 VIEW

[x forearm ap right]
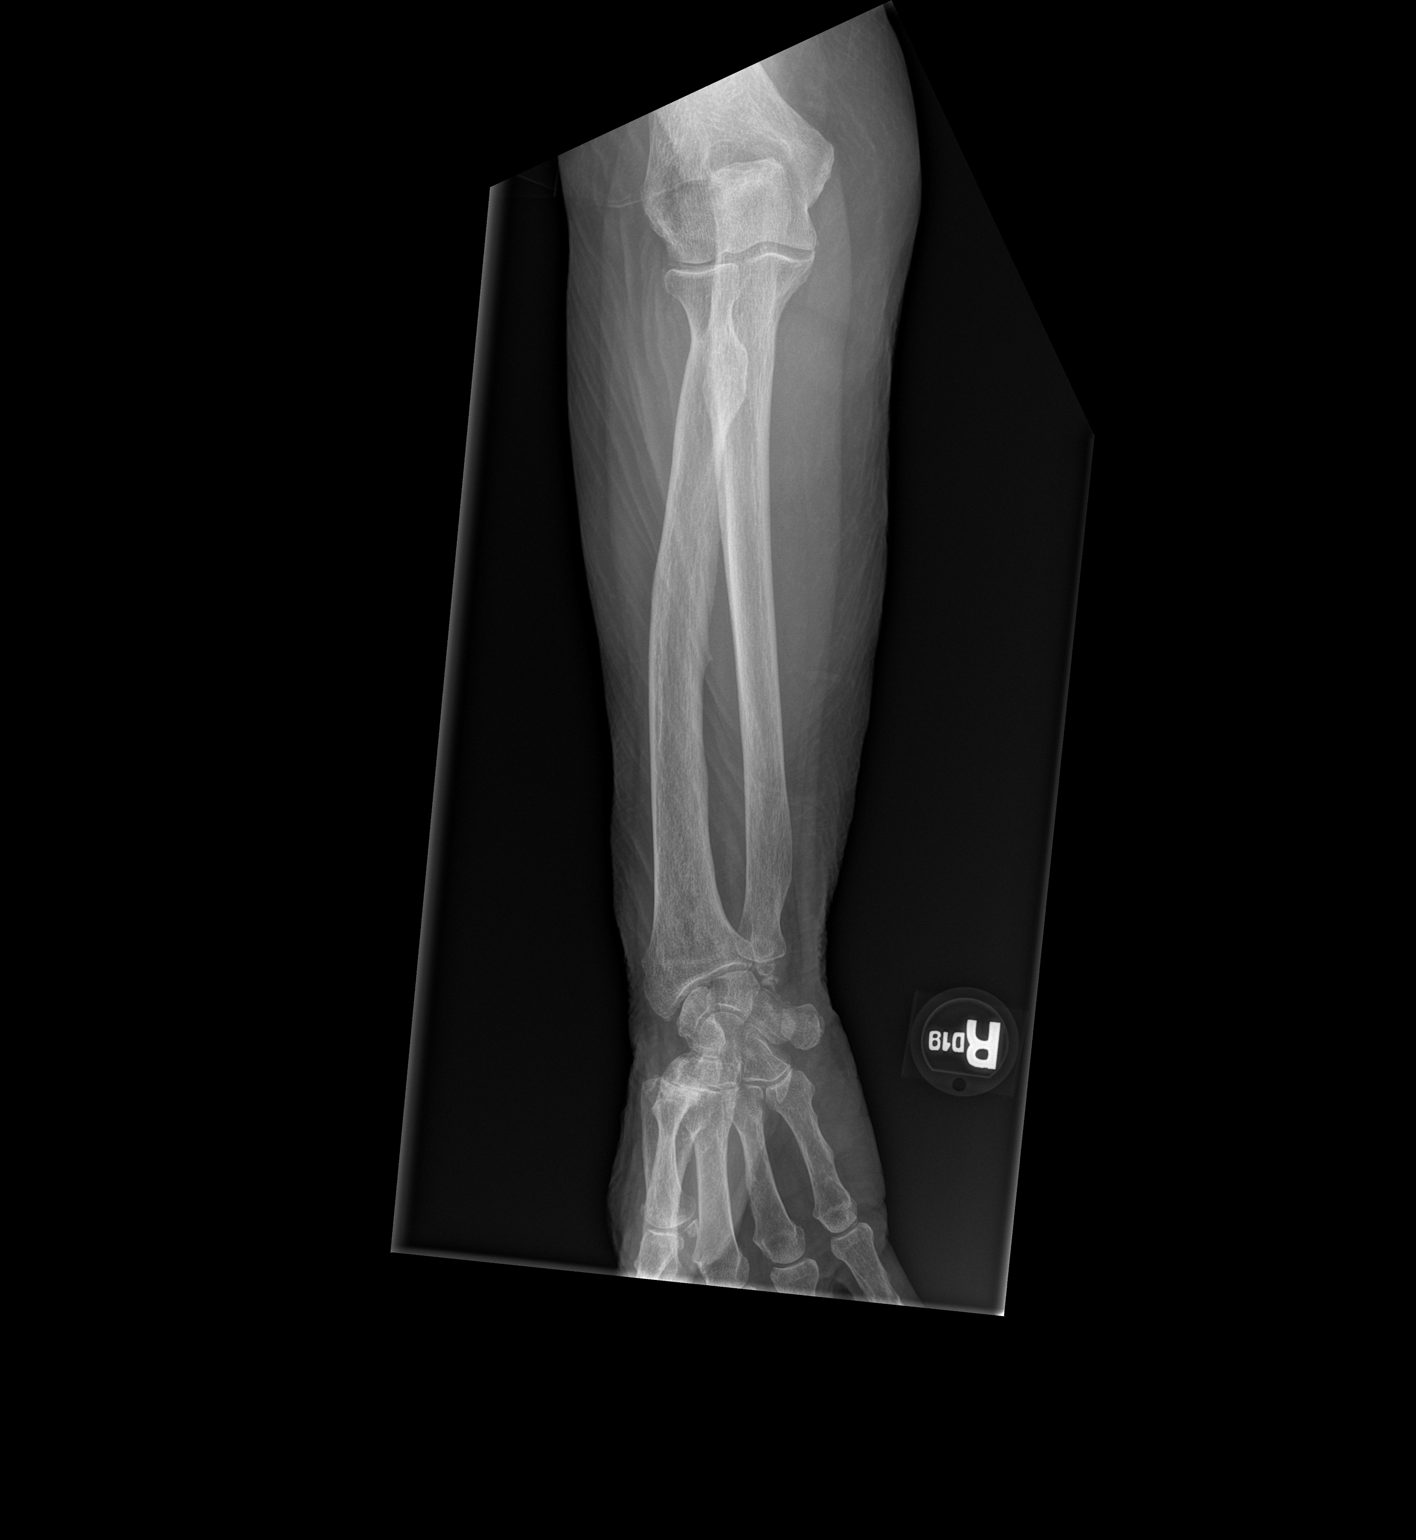

[x forearm lat right]
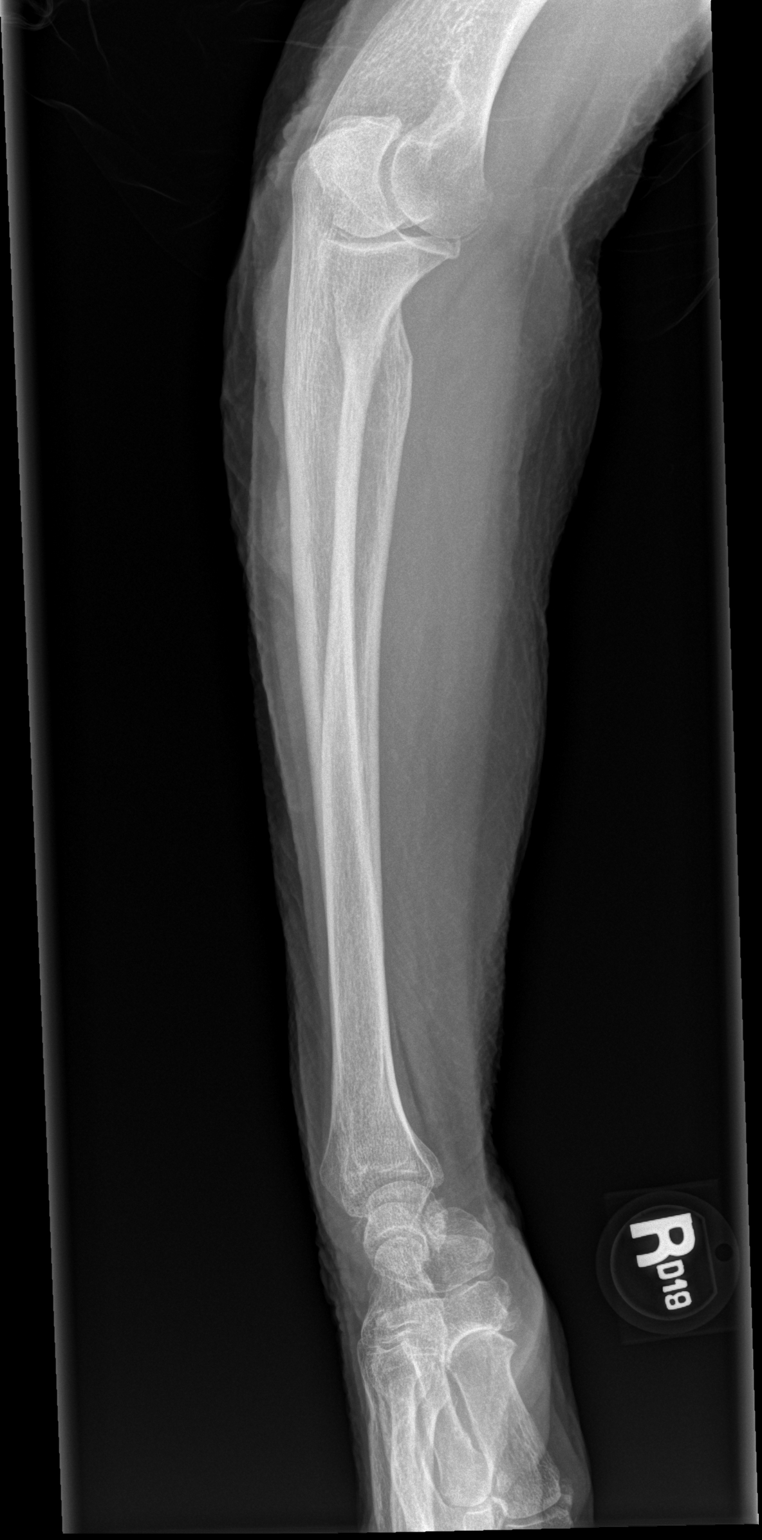

[2 of 2 positions shown; findings below may reference images not displayed]

FINDINGS: There is no evidence of fracture or other focal bone lesions. Soft
tissues are unremarkable.
IMPRESSION: No acute abnormality noted.
